# Patient Record
Sex: Male | Born: 1935 | Race: White | Hispanic: No | Marital: Married | Smoking: Never smoker
Health system: Southern US, Community
[De-identification: ages and names within clinical notes are randomized; demographics above are authoritative.]

## PROBLEM LIST (undated history)

## (undated) ENCOUNTER — Emergency Department (HOSPITAL_COMMUNITY): Payer: PRIVATE HEALTH INSURANCE | Source: Home / Self Care

## (undated) DIAGNOSIS — G8929 Other chronic pain: Secondary | ICD-10-CM

## (undated) DIAGNOSIS — I639 Cerebral infarction, unspecified: Secondary | ICD-10-CM

## (undated) DIAGNOSIS — E7800 Pure hypercholesterolemia, unspecified: Secondary | ICD-10-CM

## (undated) DIAGNOSIS — I10 Essential (primary) hypertension: Secondary | ICD-10-CM

## (undated) DIAGNOSIS — J449 Chronic obstructive pulmonary disease, unspecified: Secondary | ICD-10-CM

## (undated) HISTORY — PX: BACK SURGERY: SHX140

## (undated) HISTORY — DX: Cerebral infarction, unspecified: I63.9

## (undated) HISTORY — PX: JOINT REPLACEMENT: SHX530

---

## 2004-08-15 ENCOUNTER — Ambulatory Visit: Payer: Self-pay | Admitting: Internal Medicine

## 2004-08-28 ENCOUNTER — Ambulatory Visit: Payer: Self-pay | Admitting: Internal Medicine

## 2004-09-01 ENCOUNTER — Ambulatory Visit (HOSPITAL_COMMUNITY): Admission: RE | Admit: 2004-09-01 | Discharge: 2004-09-01 | Payer: Self-pay | Admitting: Pulmonary Disease

## 2004-09-06 ENCOUNTER — Ambulatory Visit: Payer: Self-pay | Admitting: Internal Medicine

## 2006-07-05 ENCOUNTER — Ambulatory Visit: Admission: RE | Admit: 2006-07-05 | Discharge: 2006-10-03 | Payer: Self-pay | Admitting: Radiation Oncology

## 2006-09-23 LAB — URINALYSIS, MICROSCOPIC - CHCC
Bilirubin (Urine): NEGATIVE
Glucose: NEGATIVE g/dL
Ketones: NEGATIVE mg/dL
Leukocyte Esterase: NEGATIVE
Nitrite: NEGATIVE
Protein: NEGATIVE mg/dL
Specific Gravity, Urine: 1.005 (ref 1.003–1.035)
WBC, UA: NEGATIVE (ref 0–2)
pH: 8 (ref 4.6–8.0)

## 2006-09-24 LAB — URINE CULTURE

## 2006-10-04 ENCOUNTER — Ambulatory Visit: Admission: RE | Admit: 2006-10-04 | Discharge: 2006-12-12 | Payer: Self-pay | Admitting: Radiation Oncology

## 2009-08-08 ENCOUNTER — Encounter: Payer: Self-pay | Admitting: Family Medicine

## 2009-09-13 ENCOUNTER — Ambulatory Visit: Payer: Self-pay | Admitting: Sports Medicine

## 2009-09-13 DIAGNOSIS — M129 Arthropathy, unspecified: Secondary | ICD-10-CM | POA: Insufficient documentation

## 2009-09-13 DIAGNOSIS — M216X9 Other acquired deformities of unspecified foot: Secondary | ICD-10-CM | POA: Insufficient documentation

## 2009-09-13 DIAGNOSIS — M1990 Unspecified osteoarthritis, unspecified site: Secondary | ICD-10-CM | POA: Insufficient documentation

## 2009-09-13 DIAGNOSIS — R269 Unspecified abnormalities of gait and mobility: Secondary | ICD-10-CM | POA: Insufficient documentation

## 2010-09-07 ENCOUNTER — Inpatient Hospital Stay (HOSPITAL_COMMUNITY)
Admission: RE | Admit: 2010-09-07 | Discharge: 2010-09-10 | Payer: Self-pay | Source: Home / Self Care | Attending: Neurological Surgery | Admitting: Neurological Surgery

## 2010-10-10 NOTE — Assessment & Plan Note (Signed)
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  Patient had orthotics given to him 10 years ago for knee DJD which helped a great deal. Still has the same ones but not as much support and falling apart. Knees currently stable - no mechanical symptoms Received ESIs for low back. No foot/ankle issues.  Allergies (verified): No Known Drug Allergies  Physical Exam  General:  Well-developed,well-nourished,in no acute distress; alert,appropriate and cooperative throughout examination Msk:  Bilateral knees: 1+ synovitis, 1+ crepitation Minimal medial joint line TTP.  Bilateral feet: Cavus feet Mild hallux rigidus No hallux valgus Mild transverse arch breakdown but no callus formation. Leg lengths equal. Gait with outturning bilateral feet to walk on broader base with some pronation.   Impression & Recommendations:  Problem # 1:  ARTHRITIS, KNEES, BILATERAL (ICD-716.98) Assessment Unchanged  Patient was fitted for a : standard, cushioned, semi-rigid orthotic. The orthotic was heated and afterward the patient stood on the orthotic blank positioned on the orthotic stand. The patient was positioned in subtalar neutral position and 10 degrees of ankle dorsiflexion in a weight bearing stance. After completion of molding, a stable base was applied to the orthotic blank. The blank was ground to a stable position for weight bearing. Size: 11 blue fastek Base: blue med density EVA Posting: none additional Total prep time 45 minutes. Gait improved - less pronation and less outturning bilateral feet.  Both orthotics  comfortable.  Orders: Orthotic Materials, each unit [phone])  Problem # 2:  ABNORMALITY OF GAIT (ICD-781.2) Assessment: Unchanged  Orders: Orthotic Materials, each unit ([id number])  Problem # 3:  CAVUS DEFORMITY OF FOOT, ACQUIRED (ICD-736.73) Assessment: Unchanged  Orders: Orthotic Materials, each unit (G2952)  Problem # 4:  OSTEOARTHRITIS, BACK (ICD-715.98) Assessment: Unchanged  Orders: Orthotic Materials, each unit [phone])

## 2010-10-10 NOTE — Letter (Signed)
Summary: *Consult Note  [facility]  [address]   [place], [place] [address]   Phone: [phone]  Fax: [phone]    Re:    Duane Park DOB:    11-09-1931 Barnett Abu, MD Horald Pollen, PA [facility] [address] [place], [address] [place], [place]  [address] Fax: [phone]           September 14, 2009   Dear Sherilyn Cooter and Fannie Knee    Thank you for requesting that we see the above patient for consultation.  A copy of the detailed office note will be sent under separate cover, for your review.  Evaluation today is consistent with: DJD of knees and DDD of spine for which orthotics have lessened symptoms in past.  current orthotics are breaking down and more rigid in construction.   Our recommendation is for: custom orthotic fabricated today with more cushion.  Hopefully, this will lessen knee pain and possibly some of his back pain.   Thank you for this consultation.  If you have any further questions regarding the care of this patient, please do not hesitate to contact me @ [phone].  Thank you for this opportunity to look after your patient.    Sincerely,  Vincent Gros MD

## 2010-10-10 NOTE — Consult Note (Signed)
Summary: [facility]   Imported By: Marily Memos 09/14/2009 09:41:04  _____________________________________________________________________  External Attachment:    Type:   Image     Comment:   External Document

## 2010-11-20 LAB — CBC
HCT: 48.7 % (ref 39.0–52.0)
Hemoglobin: 16.7 g/dL (ref 13.0–17.0)
MCH: 34.6 pg — ABNORMAL HIGH (ref 26.0–34.0)
MCHC: 34.3 g/dL (ref 30.0–36.0)
MCV: 101 fL — ABNORMAL HIGH (ref 78.0–100.0)
Platelets: 198 10*3/uL (ref 150–400)
RBC: 4.82 MIL/uL (ref 4.22–5.81)
RDW: 12.4 % (ref 11.5–15.5)
WBC: 8.8 10*3/uL (ref 4.0–10.5)

## 2010-11-20 LAB — BASIC METABOLIC PANEL
BUN: 40 mg/dL — ABNORMAL HIGH (ref 6–23)
CO2: 27 mEq/L (ref 19–32)
Calcium: 9.2 mg/dL (ref 8.4–10.5)
Chloride: 104 mEq/L (ref 96–112)
Creatinine, Ser: 1.12 mg/dL (ref 0.4–1.5)
GFR calc Af Amer: >60 mL/min (ref >60)
GFR calc non Af Amer: >60 mL/min (ref >60)
Glucose, Bld: 100 mg/dL — ABNORMAL HIGH (ref 70–99)
Potassium: 3.8 mEq/L (ref 3.5–5.1)
Sodium: 139 mEq/L (ref 135–145)

## 2010-11-20 LAB — TYPE AND SCREEN
ABO/RH(D): O POS
Antibody Screen: NEGATIVE

## 2010-11-20 LAB — SURGICAL PCR SCREEN
MRSA, PCR: NEGATIVE
Staphylococcus aureus: NEGATIVE

## 2010-11-20 LAB — ABO/RH: ABO/RH(D): O POS

## 2011-10-15 DIAGNOSIS — Z8546 Personal history of malignant neoplasm of prostate: Secondary | ICD-10-CM | POA: Diagnosis not present

## 2011-10-22 DIAGNOSIS — Z8546 Personal history of malignant neoplasm of prostate: Secondary | ICD-10-CM | POA: Diagnosis not present

## 2011-10-22 DIAGNOSIS — N401 Enlarged prostate with lower urinary tract symptoms: Secondary | ICD-10-CM | POA: Diagnosis not present

## 2011-10-22 DIAGNOSIS — N138 Other obstructive and reflux uropathy: Secondary | ICD-10-CM | POA: Diagnosis not present

## 2011-10-22 DIAGNOSIS — N529 Male erectile dysfunction, unspecified: Secondary | ICD-10-CM | POA: Diagnosis not present

## 2011-10-26 DIAGNOSIS — Z79899 Other long term (current) drug therapy: Secondary | ICD-10-CM | POA: Diagnosis not present

## 2011-10-26 DIAGNOSIS — I10 Essential (primary) hypertension: Secondary | ICD-10-CM | POA: Diagnosis not present

## 2011-10-26 DIAGNOSIS — E782 Mixed hyperlipidemia: Secondary | ICD-10-CM | POA: Diagnosis not present

## 2011-10-26 DIAGNOSIS — K219 Gastro-esophageal reflux disease without esophagitis: Secondary | ICD-10-CM | POA: Diagnosis not present

## 2011-12-05 DIAGNOSIS — H4900 Third [oculomotor] nerve palsy, unspecified eye: Secondary | ICD-10-CM | POA: Diagnosis not present

## 2011-12-05 DIAGNOSIS — H3530 Unspecified macular degeneration: Secondary | ICD-10-CM | POA: Diagnosis not present

## 2011-12-05 DIAGNOSIS — H26499 Other secondary cataract, unspecified eye: Secondary | ICD-10-CM | POA: Diagnosis not present

## 2011-12-05 DIAGNOSIS — H532 Diplopia: Secondary | ICD-10-CM | POA: Diagnosis not present

## 2012-02-22 DIAGNOSIS — K219 Gastro-esophageal reflux disease without esophagitis: Secondary | ICD-10-CM | POA: Diagnosis not present

## 2012-02-22 DIAGNOSIS — I10 Essential (primary) hypertension: Secondary | ICD-10-CM | POA: Diagnosis not present

## 2012-02-22 DIAGNOSIS — E782 Mixed hyperlipidemia: Secondary | ICD-10-CM | POA: Diagnosis not present

## 2012-02-22 DIAGNOSIS — Z79899 Other long term (current) drug therapy: Secondary | ICD-10-CM | POA: Diagnosis not present

## 2012-03-07 DIAGNOSIS — H532 Diplopia: Secondary | ICD-10-CM | POA: Diagnosis not present

## 2012-03-07 DIAGNOSIS — H26499 Other secondary cataract, unspecified eye: Secondary | ICD-10-CM | POA: Diagnosis not present

## 2012-03-07 DIAGNOSIS — H3530 Unspecified macular degeneration: Secondary | ICD-10-CM | POA: Diagnosis not present

## 2012-04-18 DIAGNOSIS — M47817 Spondylosis without myelopathy or radiculopathy, lumbosacral region: Secondary | ICD-10-CM | POA: Diagnosis not present

## 2012-04-18 DIAGNOSIS — M48061 Spinal stenosis, lumbar region without neurogenic claudication: Secondary | ICD-10-CM | POA: Diagnosis not present

## 2012-06-20 DIAGNOSIS — Z23 Encounter for immunization: Secondary | ICD-10-CM | POA: Diagnosis not present

## 2012-06-20 DIAGNOSIS — K219 Gastro-esophageal reflux disease without esophagitis: Secondary | ICD-10-CM | POA: Diagnosis not present

## 2012-06-20 DIAGNOSIS — I10 Essential (primary) hypertension: Secondary | ICD-10-CM | POA: Diagnosis not present

## 2012-06-20 DIAGNOSIS — E782 Mixed hyperlipidemia: Secondary | ICD-10-CM | POA: Diagnosis not present

## 2012-06-20 DIAGNOSIS — Z79899 Other long term (current) drug therapy: Secondary | ICD-10-CM | POA: Diagnosis not present

## 2012-10-15 DIAGNOSIS — Z8546 Personal history of malignant neoplasm of prostate: Secondary | ICD-10-CM | POA: Diagnosis not present

## 2012-10-17 DIAGNOSIS — E782 Mixed hyperlipidemia: Secondary | ICD-10-CM | POA: Diagnosis not present

## 2012-10-17 DIAGNOSIS — Z79899 Other long term (current) drug therapy: Secondary | ICD-10-CM | POA: Diagnosis not present

## 2012-10-17 DIAGNOSIS — I10 Essential (primary) hypertension: Secondary | ICD-10-CM | POA: Diagnosis not present

## 2012-10-22 DIAGNOSIS — N138 Other obstructive and reflux uropathy: Secondary | ICD-10-CM | POA: Diagnosis not present

## 2012-10-22 DIAGNOSIS — R3129 Other microscopic hematuria: Secondary | ICD-10-CM | POA: Diagnosis not present

## 2012-10-22 DIAGNOSIS — N401 Enlarged prostate with lower urinary tract symptoms: Secondary | ICD-10-CM | POA: Diagnosis not present

## 2012-10-22 DIAGNOSIS — Z8546 Personal history of malignant neoplasm of prostate: Secondary | ICD-10-CM | POA: Diagnosis not present

## 2012-10-22 DIAGNOSIS — N529 Male erectile dysfunction, unspecified: Secondary | ICD-10-CM | POA: Diagnosis not present

## 2012-11-04 DIAGNOSIS — H26499 Other secondary cataract, unspecified eye: Secondary | ICD-10-CM | POA: Diagnosis not present

## 2012-11-04 DIAGNOSIS — H35039 Hypertensive retinopathy, unspecified eye: Secondary | ICD-10-CM | POA: Diagnosis not present

## 2012-11-04 DIAGNOSIS — H4900 Third [oculomotor] nerve palsy, unspecified eye: Secondary | ICD-10-CM | POA: Diagnosis not present

## 2012-11-06 DIAGNOSIS — H26499 Other secondary cataract, unspecified eye: Secondary | ICD-10-CM | POA: Diagnosis not present

## 2013-02-13 DIAGNOSIS — E782 Mixed hyperlipidemia: Secondary | ICD-10-CM | POA: Diagnosis not present

## 2013-02-13 DIAGNOSIS — M7730 Calcaneal spur, unspecified foot: Secondary | ICD-10-CM | POA: Diagnosis not present

## 2013-02-13 DIAGNOSIS — I10 Essential (primary) hypertension: Secondary | ICD-10-CM | POA: Diagnosis not present

## 2013-02-13 DIAGNOSIS — M79609 Pain in unspecified limb: Secondary | ICD-10-CM | POA: Diagnosis not present

## 2013-03-06 DIAGNOSIS — Z981 Arthrodesis status: Secondary | ICD-10-CM | POA: Diagnosis not present

## 2013-03-06 DIAGNOSIS — M47817 Spondylosis without myelopathy or radiculopathy, lumbosacral region: Secondary | ICD-10-CM | POA: Diagnosis not present

## 2013-03-06 DIAGNOSIS — IMO0002 Reserved for concepts with insufficient information to code with codable children: Secondary | ICD-10-CM | POA: Diagnosis not present

## 2013-03-06 DIAGNOSIS — M4180 Other forms of scoliosis, site unspecified: Secondary | ICD-10-CM | POA: Diagnosis not present

## 2013-03-06 DIAGNOSIS — M961 Postlaminectomy syndrome, not elsewhere classified: Secondary | ICD-10-CM | POA: Diagnosis not present

## 2013-06-18 DIAGNOSIS — Z23 Encounter for immunization: Secondary | ICD-10-CM | POA: Diagnosis not present

## 2013-06-18 DIAGNOSIS — E782 Mixed hyperlipidemia: Secondary | ICD-10-CM | POA: Diagnosis not present

## 2013-06-18 DIAGNOSIS — I10 Essential (primary) hypertension: Secondary | ICD-10-CM | POA: Diagnosis not present

## 2013-06-18 DIAGNOSIS — Z79899 Other long term (current) drug therapy: Secondary | ICD-10-CM | POA: Diagnosis not present

## 2013-06-18 DIAGNOSIS — Z86010 Personal history of colonic polyps: Secondary | ICD-10-CM | POA: Diagnosis not present

## 2013-06-18 DIAGNOSIS — K219 Gastro-esophageal reflux disease without esophagitis: Secondary | ICD-10-CM | POA: Diagnosis not present

## 2013-06-18 DIAGNOSIS — L570 Actinic keratosis: Secondary | ICD-10-CM | POA: Diagnosis not present

## 2013-06-18 DIAGNOSIS — Z0000 Encounter for general adult medical examination without abnormal findings: Secondary | ICD-10-CM | POA: Diagnosis not present

## 2013-06-21 DIAGNOSIS — J0100 Acute maxillary sinusitis, unspecified: Secondary | ICD-10-CM | POA: Diagnosis not present

## 2013-06-21 DIAGNOSIS — J209 Acute bronchitis, unspecified: Secondary | ICD-10-CM | POA: Diagnosis not present

## 2013-09-22 DIAGNOSIS — IMO0002 Reserved for concepts with insufficient information to code with codable children: Secondary | ICD-10-CM | POA: Diagnosis not present

## 2013-09-22 DIAGNOSIS — M47817 Spondylosis without myelopathy or radiculopathy, lumbosacral region: Secondary | ICD-10-CM | POA: Diagnosis not present

## 2013-10-21 DIAGNOSIS — M545 Low back pain, unspecified: Secondary | ICD-10-CM | POA: Diagnosis not present

## 2013-10-29 DIAGNOSIS — I10 Essential (primary) hypertension: Secondary | ICD-10-CM | POA: Diagnosis not present

## 2013-10-29 DIAGNOSIS — R1032 Left lower quadrant pain: Secondary | ICD-10-CM | POA: Diagnosis not present

## 2013-10-29 DIAGNOSIS — E782 Mixed hyperlipidemia: Secondary | ICD-10-CM | POA: Diagnosis not present

## 2013-10-29 DIAGNOSIS — M545 Low back pain, unspecified: Secondary | ICD-10-CM | POA: Diagnosis not present

## 2013-10-30 DIAGNOSIS — R1032 Left lower quadrant pain: Secondary | ICD-10-CM | POA: Diagnosis not present

## 2013-10-30 DIAGNOSIS — N200 Calculus of kidney: Secondary | ICD-10-CM | POA: Diagnosis not present

## 2013-10-30 DIAGNOSIS — Z8546 Personal history of malignant neoplasm of prostate: Secondary | ICD-10-CM | POA: Diagnosis not present

## 2013-10-30 DIAGNOSIS — Q618 Other cystic kidney diseases: Secondary | ICD-10-CM | POA: Diagnosis not present

## 2013-11-11 DIAGNOSIS — Z8546 Personal history of malignant neoplasm of prostate: Secondary | ICD-10-CM | POA: Diagnosis not present

## 2013-11-16 DIAGNOSIS — B029 Zoster without complications: Secondary | ICD-10-CM | POA: Diagnosis not present

## 2013-11-18 DIAGNOSIS — Z8546 Personal history of malignant neoplasm of prostate: Secondary | ICD-10-CM | POA: Diagnosis not present

## 2013-11-18 DIAGNOSIS — N200 Calculus of kidney: Secondary | ICD-10-CM | POA: Diagnosis not present

## 2013-11-18 DIAGNOSIS — D4959 Neoplasm of unspecified behavior of other genitourinary organ: Secondary | ICD-10-CM | POA: Diagnosis not present

## 2013-12-08 DIAGNOSIS — L719 Rosacea, unspecified: Secondary | ICD-10-CM | POA: Diagnosis not present

## 2013-12-08 DIAGNOSIS — H524 Presbyopia: Secondary | ICD-10-CM | POA: Diagnosis not present

## 2013-12-08 DIAGNOSIS — H353190 Nonexudative age-related macular degeneration, unspecified eye, stage unspecified: Secondary | ICD-10-CM | POA: Diagnosis not present

## 2013-12-08 DIAGNOSIS — Z961 Presence of intraocular lens: Secondary | ICD-10-CM | POA: Diagnosis not present

## 2013-12-17 DIAGNOSIS — H905 Unspecified sensorineural hearing loss: Secondary | ICD-10-CM | POA: Diagnosis not present

## 2013-12-17 DIAGNOSIS — H9319 Tinnitus, unspecified ear: Secondary | ICD-10-CM | POA: Diagnosis not present

## 2013-12-17 DIAGNOSIS — H903 Sensorineural hearing loss, bilateral: Secondary | ICD-10-CM | POA: Diagnosis not present

## 2014-01-07 DIAGNOSIS — H9319 Tinnitus, unspecified ear: Secondary | ICD-10-CM | POA: Diagnosis not present

## 2014-01-07 DIAGNOSIS — H905 Unspecified sensorineural hearing loss: Secondary | ICD-10-CM | POA: Diagnosis not present

## 2014-01-07 DIAGNOSIS — H908 Mixed conductive and sensorineural hearing loss, unspecified: Secondary | ICD-10-CM | POA: Diagnosis not present

## 2014-01-08 DIAGNOSIS — H9319 Tinnitus, unspecified ear: Secondary | ICD-10-CM | POA: Diagnosis not present

## 2014-01-08 DIAGNOSIS — H905 Unspecified sensorineural hearing loss: Secondary | ICD-10-CM | POA: Diagnosis not present

## 2014-01-08 DIAGNOSIS — H903 Sensorineural hearing loss, bilateral: Secondary | ICD-10-CM | POA: Diagnosis not present

## 2014-01-14 DIAGNOSIS — J209 Acute bronchitis, unspecified: Secondary | ICD-10-CM | POA: Diagnosis not present

## 2014-01-18 DIAGNOSIS — H9319 Tinnitus, unspecified ear: Secondary | ICD-10-CM | POA: Diagnosis not present

## 2014-01-18 DIAGNOSIS — G319 Degenerative disease of nervous system, unspecified: Secondary | ICD-10-CM | POA: Diagnosis not present

## 2014-01-18 DIAGNOSIS — R930 Abnormal findings on diagnostic imaging of skull and head, not elsewhere classified: Secondary | ICD-10-CM | POA: Diagnosis not present

## 2014-01-18 DIAGNOSIS — H903 Sensorineural hearing loss, bilateral: Secondary | ICD-10-CM | POA: Diagnosis not present

## 2014-02-16 DIAGNOSIS — M545 Low back pain, unspecified: Secondary | ICD-10-CM | POA: Diagnosis not present

## 2014-02-16 DIAGNOSIS — M4120 Other idiopathic scoliosis, site unspecified: Secondary | ICD-10-CM | POA: Diagnosis not present

## 2014-02-16 DIAGNOSIS — Z6829 Body mass index (BMI) 29.0-29.9, adult: Secondary | ICD-10-CM | POA: Diagnosis not present

## 2014-02-25 DIAGNOSIS — IMO0002 Reserved for concepts with insufficient information to code with codable children: Secondary | ICD-10-CM | POA: Diagnosis not present

## 2014-02-25 DIAGNOSIS — I10 Essential (primary) hypertension: Secondary | ICD-10-CM | POA: Diagnosis not present

## 2014-02-25 DIAGNOSIS — E782 Mixed hyperlipidemia: Secondary | ICD-10-CM | POA: Diagnosis not present

## 2014-03-08 DIAGNOSIS — M545 Low back pain, unspecified: Secondary | ICD-10-CM | POA: Diagnosis not present

## 2014-03-08 DIAGNOSIS — IMO0002 Reserved for concepts with insufficient information to code with codable children: Secondary | ICD-10-CM | POA: Diagnosis not present

## 2014-04-01 DIAGNOSIS — IMO0002 Reserved for concepts with insufficient information to code with codable children: Secondary | ICD-10-CM | POA: Diagnosis not present

## 2014-04-06 DIAGNOSIS — IMO0002 Reserved for concepts with insufficient information to code with codable children: Secondary | ICD-10-CM | POA: Diagnosis not present

## 2014-04-07 DIAGNOSIS — M5126 Other intervertebral disc displacement, lumbar region: Secondary | ICD-10-CM | POA: Diagnosis not present

## 2014-04-07 DIAGNOSIS — M48061 Spinal stenosis, lumbar region without neurogenic claudication: Secondary | ICD-10-CM | POA: Diagnosis not present

## 2014-04-07 DIAGNOSIS — IMO0002 Reserved for concepts with insufficient information to code with codable children: Secondary | ICD-10-CM | POA: Diagnosis not present

## 2014-04-15 DIAGNOSIS — Z6829 Body mass index (BMI) 29.0-29.9, adult: Secondary | ICD-10-CM | POA: Diagnosis not present

## 2014-04-15 DIAGNOSIS — IMO0002 Reserved for concepts with insufficient information to code with codable children: Secondary | ICD-10-CM | POA: Diagnosis not present

## 2014-06-11 DIAGNOSIS — M545 Low back pain: Secondary | ICD-10-CM | POA: Diagnosis not present

## 2014-06-11 DIAGNOSIS — M4716 Other spondylosis with myelopathy, lumbar region: Secondary | ICD-10-CM | POA: Diagnosis not present

## 2014-07-08 DIAGNOSIS — I10 Essential (primary) hypertension: Secondary | ICD-10-CM | POA: Diagnosis not present

## 2014-07-08 DIAGNOSIS — E782 Mixed hyperlipidemia: Secondary | ICD-10-CM | POA: Diagnosis not present

## 2014-07-08 DIAGNOSIS — K219 Gastro-esophageal reflux disease without esophagitis: Secondary | ICD-10-CM | POA: Diagnosis not present

## 2014-07-08 DIAGNOSIS — Z23 Encounter for immunization: Secondary | ICD-10-CM | POA: Diagnosis not present

## 2014-07-08 DIAGNOSIS — Z79899 Other long term (current) drug therapy: Secondary | ICD-10-CM | POA: Diagnosis not present

## 2014-08-04 DIAGNOSIS — Z6829 Body mass index (BMI) 29.0-29.9, adult: Secondary | ICD-10-CM | POA: Diagnosis not present

## 2014-08-04 DIAGNOSIS — M419 Scoliosis, unspecified: Secondary | ICD-10-CM | POA: Diagnosis not present

## 2014-10-08 DIAGNOSIS — M4806 Spinal stenosis, lumbar region: Secondary | ICD-10-CM | POA: Diagnosis not present

## 2014-10-08 DIAGNOSIS — Z981 Arthrodesis status: Secondary | ICD-10-CM | POA: Diagnosis not present

## 2014-10-08 DIAGNOSIS — M47816 Spondylosis without myelopathy or radiculopathy, lumbar region: Secondary | ICD-10-CM | POA: Diagnosis not present

## 2014-10-14 DIAGNOSIS — I10 Essential (primary) hypertension: Secondary | ICD-10-CM | POA: Diagnosis not present

## 2014-10-14 DIAGNOSIS — K219 Gastro-esophageal reflux disease without esophagitis: Secondary | ICD-10-CM | POA: Diagnosis not present

## 2014-10-14 DIAGNOSIS — D4102 Neoplasm of uncertain behavior of left kidney: Secondary | ICD-10-CM | POA: Diagnosis not present

## 2014-10-14 DIAGNOSIS — Z0000 Encounter for general adult medical examination without abnormal findings: Secondary | ICD-10-CM | POA: Diagnosis not present

## 2014-10-14 DIAGNOSIS — E782 Mixed hyperlipidemia: Secondary | ICD-10-CM | POA: Diagnosis not present

## 2014-10-14 DIAGNOSIS — Z23 Encounter for immunization: Secondary | ICD-10-CM | POA: Diagnosis not present

## 2014-10-14 DIAGNOSIS — Z8546 Personal history of malignant neoplasm of prostate: Secondary | ICD-10-CM | POA: Diagnosis not present

## 2014-11-29 DIAGNOSIS — Z8546 Personal history of malignant neoplasm of prostate: Secondary | ICD-10-CM | POA: Diagnosis not present

## 2014-12-06 DIAGNOSIS — D495 Neoplasm of unspecified behavior of other genitourinary organs: Secondary | ICD-10-CM | POA: Diagnosis not present

## 2014-12-06 DIAGNOSIS — N200 Calculus of kidney: Secondary | ICD-10-CM | POA: Diagnosis not present

## 2014-12-06 DIAGNOSIS — Z8546 Personal history of malignant neoplasm of prostate: Secondary | ICD-10-CM | POA: Diagnosis not present

## 2014-12-06 DIAGNOSIS — N281 Cyst of kidney, acquired: Secondary | ICD-10-CM | POA: Diagnosis not present

## 2014-12-06 DIAGNOSIS — N5201 Erectile dysfunction due to arterial insufficiency: Secondary | ICD-10-CM | POA: Diagnosis not present

## 2015-01-14 DIAGNOSIS — M47816 Spondylosis without myelopathy or radiculopathy, lumbar region: Secondary | ICD-10-CM | POA: Diagnosis not present

## 2015-01-14 DIAGNOSIS — M5416 Radiculopathy, lumbar region: Secondary | ICD-10-CM | POA: Diagnosis not present

## 2015-01-14 DIAGNOSIS — Z981 Arthrodesis status: Secondary | ICD-10-CM | POA: Diagnosis not present

## 2015-01-14 DIAGNOSIS — M4306 Spondylolysis, lumbar region: Secondary | ICD-10-CM | POA: Diagnosis not present

## 2015-01-18 DIAGNOSIS — H26493 Other secondary cataract, bilateral: Secondary | ICD-10-CM | POA: Diagnosis not present

## 2015-02-24 DIAGNOSIS — E780 Pure hypercholesterolemia: Secondary | ICD-10-CM | POA: Diagnosis not present

## 2015-02-24 DIAGNOSIS — K219 Gastro-esophageal reflux disease without esophagitis: Secondary | ICD-10-CM | POA: Diagnosis not present

## 2015-02-24 DIAGNOSIS — I10 Essential (primary) hypertension: Secondary | ICD-10-CM | POA: Diagnosis not present

## 2015-02-24 DIAGNOSIS — E782 Mixed hyperlipidemia: Secondary | ICD-10-CM | POA: Diagnosis not present

## 2015-02-24 DIAGNOSIS — Z79899 Other long term (current) drug therapy: Secondary | ICD-10-CM | POA: Diagnosis not present

## 2015-03-10 DIAGNOSIS — R05 Cough: Secondary | ICD-10-CM | POA: Diagnosis not present

## 2015-05-13 DIAGNOSIS — M5416 Radiculopathy, lumbar region: Secondary | ICD-10-CM | POA: Diagnosis not present

## 2015-05-13 DIAGNOSIS — M5442 Lumbago with sciatica, left side: Secondary | ICD-10-CM | POA: Diagnosis not present

## 2015-06-27 DIAGNOSIS — I10 Essential (primary) hypertension: Secondary | ICD-10-CM | POA: Diagnosis not present

## 2015-06-27 DIAGNOSIS — M545 Low back pain: Secondary | ICD-10-CM | POA: Diagnosis not present

## 2015-06-27 DIAGNOSIS — E782 Mixed hyperlipidemia: Secondary | ICD-10-CM | POA: Diagnosis not present

## 2015-06-27 DIAGNOSIS — Z23 Encounter for immunization: Secondary | ICD-10-CM | POA: Diagnosis not present

## 2015-10-03 DIAGNOSIS — J209 Acute bronchitis, unspecified: Secondary | ICD-10-CM | POA: Diagnosis not present

## 2015-10-17 DIAGNOSIS — E669 Obesity, unspecified: Secondary | ICD-10-CM | POA: Diagnosis not present

## 2015-10-17 DIAGNOSIS — Z0000 Encounter for general adult medical examination without abnormal findings: Secondary | ICD-10-CM | POA: Diagnosis not present

## 2015-10-17 DIAGNOSIS — Z8546 Personal history of malignant neoplasm of prostate: Secondary | ICD-10-CM | POA: Diagnosis not present

## 2015-10-17 DIAGNOSIS — K219 Gastro-esophageal reflux disease without esophagitis: Secondary | ICD-10-CM | POA: Diagnosis not present

## 2015-10-17 DIAGNOSIS — E782 Mixed hyperlipidemia: Secondary | ICD-10-CM | POA: Diagnosis not present

## 2015-10-17 DIAGNOSIS — Z6831 Body mass index (BMI) 31.0-31.9, adult: Secondary | ICD-10-CM | POA: Diagnosis not present

## 2015-10-17 DIAGNOSIS — Z79899 Other long term (current) drug therapy: Secondary | ICD-10-CM | POA: Diagnosis not present

## 2015-10-17 DIAGNOSIS — I10 Essential (primary) hypertension: Secondary | ICD-10-CM | POA: Diagnosis not present

## 2015-11-08 DIAGNOSIS — M25561 Pain in right knee: Secondary | ICD-10-CM | POA: Diagnosis not present

## 2015-11-08 DIAGNOSIS — M25562 Pain in left knee: Secondary | ICD-10-CM | POA: Diagnosis not present

## 2015-11-08 DIAGNOSIS — M25511 Pain in right shoulder: Secondary | ICD-10-CM | POA: Diagnosis not present

## 2015-12-01 DIAGNOSIS — J0140 Acute pansinusitis, unspecified: Secondary | ICD-10-CM | POA: Diagnosis not present

## 2015-12-01 DIAGNOSIS — Z8546 Personal history of malignant neoplasm of prostate: Secondary | ICD-10-CM | POA: Diagnosis not present

## 2015-12-08 DIAGNOSIS — N5201 Erectile dysfunction due to arterial insufficiency: Secondary | ICD-10-CM | POA: Diagnosis not present

## 2015-12-08 DIAGNOSIS — N200 Calculus of kidney: Secondary | ICD-10-CM | POA: Diagnosis not present

## 2015-12-08 DIAGNOSIS — Z8546 Personal history of malignant neoplasm of prostate: Secondary | ICD-10-CM | POA: Diagnosis not present

## 2015-12-08 DIAGNOSIS — Z0000 Encounter for general adult medical examination without abnormal findings: Secondary | ICD-10-CM | POA: Diagnosis not present

## 2016-01-05 DIAGNOSIS — L570 Actinic keratosis: Secondary | ICD-10-CM | POA: Diagnosis not present

## 2016-01-05 DIAGNOSIS — L821 Other seborrheic keratosis: Secondary | ICD-10-CM | POA: Diagnosis not present

## 2016-01-05 DIAGNOSIS — L578 Other skin changes due to chronic exposure to nonionizing radiation: Secondary | ICD-10-CM | POA: Diagnosis not present

## 2016-01-05 DIAGNOSIS — D485 Neoplasm of uncertain behavior of skin: Secondary | ICD-10-CM | POA: Diagnosis not present

## 2016-01-05 DIAGNOSIS — C44311 Basal cell carcinoma of skin of nose: Secondary | ICD-10-CM | POA: Diagnosis not present

## 2016-01-20 DIAGNOSIS — H26493 Other secondary cataract, bilateral: Secondary | ICD-10-CM | POA: Diagnosis not present

## 2016-01-20 DIAGNOSIS — H524 Presbyopia: Secondary | ICD-10-CM | POA: Diagnosis not present

## 2016-02-27 DIAGNOSIS — D044 Carcinoma in situ of skin of scalp and neck: Secondary | ICD-10-CM | POA: Diagnosis not present

## 2016-03-01 DIAGNOSIS — E782 Mixed hyperlipidemia: Secondary | ICD-10-CM | POA: Diagnosis not present

## 2016-03-01 DIAGNOSIS — I10 Essential (primary) hypertension: Secondary | ICD-10-CM | POA: Diagnosis not present

## 2016-03-01 DIAGNOSIS — M1991 Primary osteoarthritis, unspecified site: Secondary | ICD-10-CM | POA: Diagnosis not present

## 2016-03-15 DIAGNOSIS — Z8546 Personal history of malignant neoplasm of prostate: Secondary | ICD-10-CM | POA: Diagnosis not present

## 2016-04-06 DIAGNOSIS — M25511 Pain in right shoulder: Secondary | ICD-10-CM | POA: Diagnosis not present

## 2016-07-02 DIAGNOSIS — K219 Gastro-esophageal reflux disease without esophagitis: Secondary | ICD-10-CM | POA: Diagnosis not present

## 2016-07-02 DIAGNOSIS — I10 Essential (primary) hypertension: Secondary | ICD-10-CM | POA: Diagnosis not present

## 2016-07-02 DIAGNOSIS — Z23 Encounter for immunization: Secondary | ICD-10-CM | POA: Diagnosis not present

## 2016-07-02 DIAGNOSIS — E782 Mixed hyperlipidemia: Secondary | ICD-10-CM | POA: Diagnosis not present

## 2016-08-07 DIAGNOSIS — M25512 Pain in left shoulder: Secondary | ICD-10-CM | POA: Diagnosis not present

## 2016-08-10 DIAGNOSIS — M25512 Pain in left shoulder: Secondary | ICD-10-CM | POA: Diagnosis not present

## 2016-11-29 DIAGNOSIS — K219 Gastro-esophageal reflux disease without esophagitis: Secondary | ICD-10-CM | POA: Diagnosis not present

## 2016-11-29 DIAGNOSIS — I10 Essential (primary) hypertension: Secondary | ICD-10-CM | POA: Diagnosis not present

## 2016-11-29 DIAGNOSIS — Z79899 Other long term (current) drug therapy: Secondary | ICD-10-CM | POA: Diagnosis not present

## 2016-11-29 DIAGNOSIS — E782 Mixed hyperlipidemia: Secondary | ICD-10-CM | POA: Diagnosis not present

## 2016-11-29 DIAGNOSIS — M25511 Pain in right shoulder: Secondary | ICD-10-CM | POA: Diagnosis not present

## 2016-11-29 DIAGNOSIS — Z8546 Personal history of malignant neoplasm of prostate: Secondary | ICD-10-CM | POA: Diagnosis not present

## 2016-11-29 DIAGNOSIS — M25519 Pain in unspecified shoulder: Secondary | ICD-10-CM | POA: Diagnosis not present

## 2016-12-03 DIAGNOSIS — Z8546 Personal history of malignant neoplasm of prostate: Secondary | ICD-10-CM | POA: Diagnosis not present

## 2016-12-10 DIAGNOSIS — Z8546 Personal history of malignant neoplasm of prostate: Secondary | ICD-10-CM | POA: Diagnosis not present

## 2016-12-10 DIAGNOSIS — R351 Nocturia: Secondary | ICD-10-CM | POA: Diagnosis not present

## 2016-12-10 DIAGNOSIS — N401 Enlarged prostate with lower urinary tract symptoms: Secondary | ICD-10-CM | POA: Diagnosis not present

## 2017-01-08 DIAGNOSIS — L300 Nummular dermatitis: Secondary | ICD-10-CM | POA: Diagnosis not present

## 2017-01-08 DIAGNOSIS — L821 Other seborrheic keratosis: Secondary | ICD-10-CM | POA: Diagnosis not present

## 2017-01-08 DIAGNOSIS — L570 Actinic keratosis: Secondary | ICD-10-CM | POA: Diagnosis not present

## 2017-01-30 DIAGNOSIS — H532 Diplopia: Secondary | ICD-10-CM | POA: Diagnosis not present

## 2017-01-30 DIAGNOSIS — H524 Presbyopia: Secondary | ICD-10-CM | POA: Diagnosis not present

## 2017-01-30 DIAGNOSIS — H26493 Other secondary cataract, bilateral: Secondary | ICD-10-CM | POA: Diagnosis not present

## 2017-04-02 ENCOUNTER — Other Ambulatory Visit: Payer: Self-pay

## 2017-04-05 DIAGNOSIS — E6609 Other obesity due to excess calories: Secondary | ICD-10-CM | POA: Diagnosis not present

## 2017-04-05 DIAGNOSIS — K219 Gastro-esophageal reflux disease without esophagitis: Secondary | ICD-10-CM | POA: Diagnosis not present

## 2017-04-05 DIAGNOSIS — H9113 Presbycusis, bilateral: Secondary | ICD-10-CM | POA: Diagnosis not present

## 2017-04-05 DIAGNOSIS — Z0000 Encounter for general adult medical examination without abnormal findings: Secondary | ICD-10-CM | POA: Diagnosis not present

## 2017-04-05 DIAGNOSIS — I10 Essential (primary) hypertension: Secondary | ICD-10-CM | POA: Diagnosis not present

## 2017-04-05 DIAGNOSIS — Z79899 Other long term (current) drug therapy: Secondary | ICD-10-CM | POA: Diagnosis not present

## 2017-04-05 DIAGNOSIS — Z8546 Personal history of malignant neoplasm of prostate: Secondary | ICD-10-CM | POA: Diagnosis not present

## 2017-04-05 DIAGNOSIS — E669 Obesity, unspecified: Secondary | ICD-10-CM | POA: Diagnosis not present

## 2017-04-05 DIAGNOSIS — E782 Mixed hyperlipidemia: Secondary | ICD-10-CM | POA: Diagnosis not present

## 2017-04-05 DIAGNOSIS — Z6833 Body mass index (BMI) 33.0-33.9, adult: Secondary | ICD-10-CM | POA: Diagnosis not present

## 2017-04-05 DIAGNOSIS — M150 Primary generalized (osteo)arthritis: Secondary | ICD-10-CM | POA: Diagnosis not present

## 2017-05-30 DIAGNOSIS — J0100 Acute maxillary sinusitis, unspecified: Secondary | ICD-10-CM | POA: Diagnosis not present

## 2017-05-30 DIAGNOSIS — J209 Acute bronchitis, unspecified: Secondary | ICD-10-CM | POA: Diagnosis not present

## 2017-06-17 DIAGNOSIS — J069 Acute upper respiratory infection, unspecified: Secondary | ICD-10-CM | POA: Diagnosis not present

## 2017-06-17 DIAGNOSIS — J209 Acute bronchitis, unspecified: Secondary | ICD-10-CM | POA: Diagnosis not present

## 2017-06-23 DIAGNOSIS — J0100 Acute maxillary sinusitis, unspecified: Secondary | ICD-10-CM | POA: Diagnosis not present

## 2017-06-23 DIAGNOSIS — J209 Acute bronchitis, unspecified: Secondary | ICD-10-CM | POA: Diagnosis not present

## 2017-08-06 DIAGNOSIS — Z23 Encounter for immunization: Secondary | ICD-10-CM | POA: Diagnosis not present

## 2017-08-16 DIAGNOSIS — K219 Gastro-esophageal reflux disease without esophagitis: Secondary | ICD-10-CM | POA: Diagnosis not present

## 2017-08-16 DIAGNOSIS — I10 Essential (primary) hypertension: Secondary | ICD-10-CM | POA: Diagnosis not present

## 2017-08-16 DIAGNOSIS — M150 Primary generalized (osteo)arthritis: Secondary | ICD-10-CM | POA: Diagnosis not present

## 2017-10-08 DIAGNOSIS — J209 Acute bronchitis, unspecified: Secondary | ICD-10-CM | POA: Diagnosis not present

## 2017-10-08 DIAGNOSIS — J0100 Acute maxillary sinusitis, unspecified: Secondary | ICD-10-CM | POA: Diagnosis not present

## 2017-10-08 DIAGNOSIS — R0602 Shortness of breath: Secondary | ICD-10-CM | POA: Diagnosis not present

## 2017-12-09 DIAGNOSIS — Z8546 Personal history of malignant neoplasm of prostate: Secondary | ICD-10-CM | POA: Diagnosis not present

## 2017-12-16 DIAGNOSIS — R351 Nocturia: Secondary | ICD-10-CM | POA: Diagnosis not present

## 2017-12-16 DIAGNOSIS — N5201 Erectile dysfunction due to arterial insufficiency: Secondary | ICD-10-CM | POA: Diagnosis not present

## 2017-12-16 DIAGNOSIS — N401 Enlarged prostate with lower urinary tract symptoms: Secondary | ICD-10-CM | POA: Diagnosis not present

## 2017-12-16 DIAGNOSIS — C61 Malignant neoplasm of prostate: Secondary | ICD-10-CM | POA: Diagnosis not present

## 2018-01-10 DIAGNOSIS — E782 Mixed hyperlipidemia: Secondary | ICD-10-CM | POA: Diagnosis not present

## 2018-01-10 DIAGNOSIS — Z6833 Body mass index (BMI) 33.0-33.9, adult: Secondary | ICD-10-CM | POA: Diagnosis not present

## 2018-01-10 DIAGNOSIS — Z79899 Other long term (current) drug therapy: Secondary | ICD-10-CM | POA: Diagnosis not present

## 2018-01-10 DIAGNOSIS — E663 Overweight: Secondary | ICD-10-CM | POA: Diagnosis not present

## 2018-01-10 DIAGNOSIS — I10 Essential (primary) hypertension: Secondary | ICD-10-CM | POA: Diagnosis not present

## 2018-01-10 DIAGNOSIS — M150 Primary generalized (osteo)arthritis: Secondary | ICD-10-CM | POA: Diagnosis not present

## 2018-01-15 DIAGNOSIS — J205 Acute bronchitis due to respiratory syncytial virus: Secondary | ICD-10-CM | POA: Diagnosis not present

## 2018-01-15 DIAGNOSIS — J069 Acute upper respiratory infection, unspecified: Secondary | ICD-10-CM | POA: Diagnosis not present

## 2018-01-15 DIAGNOSIS — R062 Wheezing: Secondary | ICD-10-CM | POA: Diagnosis not present

## 2018-02-04 DIAGNOSIS — H532 Diplopia: Secondary | ICD-10-CM | POA: Diagnosis not present

## 2018-02-04 DIAGNOSIS — Z79899 Other long term (current) drug therapy: Secondary | ICD-10-CM | POA: Diagnosis not present

## 2018-02-04 DIAGNOSIS — H26493 Other secondary cataract, bilateral: Secondary | ICD-10-CM | POA: Diagnosis not present

## 2018-02-04 DIAGNOSIS — I10 Essential (primary) hypertension: Secondary | ICD-10-CM | POA: Diagnosis not present

## 2018-02-13 DIAGNOSIS — H532 Diplopia: Secondary | ICD-10-CM | POA: Diagnosis not present

## 2018-02-14 DIAGNOSIS — R05 Cough: Secondary | ICD-10-CM | POA: Diagnosis not present

## 2018-02-14 DIAGNOSIS — J069 Acute upper respiratory infection, unspecified: Secondary | ICD-10-CM | POA: Diagnosis not present

## 2018-02-14 DIAGNOSIS — J22 Unspecified acute lower respiratory infection: Secondary | ICD-10-CM | POA: Diagnosis not present

## 2018-03-03 DIAGNOSIS — R05 Cough: Secondary | ICD-10-CM | POA: Diagnosis not present

## 2018-03-10 DIAGNOSIS — B370 Candidal stomatitis: Secondary | ICD-10-CM | POA: Diagnosis not present

## 2018-03-17 DIAGNOSIS — R05 Cough: Secondary | ICD-10-CM | POA: Diagnosis not present

## 2018-03-18 DIAGNOSIS — H532 Diplopia: Secondary | ICD-10-CM | POA: Diagnosis not present

## 2018-03-23 DIAGNOSIS — J209 Acute bronchitis, unspecified: Secondary | ICD-10-CM | POA: Diagnosis not present

## 2018-03-23 DIAGNOSIS — J0100 Acute maxillary sinusitis, unspecified: Secondary | ICD-10-CM | POA: Diagnosis not present

## 2018-03-24 DIAGNOSIS — R05 Cough: Secondary | ICD-10-CM | POA: Diagnosis not present

## 2018-03-24 DIAGNOSIS — J22 Unspecified acute lower respiratory infection: Secondary | ICD-10-CM | POA: Diagnosis not present

## 2018-03-25 DIAGNOSIS — J22 Unspecified acute lower respiratory infection: Secondary | ICD-10-CM | POA: Diagnosis not present

## 2018-03-25 DIAGNOSIS — R05 Cough: Secondary | ICD-10-CM | POA: Diagnosis not present

## 2018-03-25 DIAGNOSIS — J984 Other disorders of lung: Secondary | ICD-10-CM | POA: Diagnosis not present

## 2018-03-26 DIAGNOSIS — R062 Wheezing: Secondary | ICD-10-CM | POA: Diagnosis not present

## 2018-03-26 DIAGNOSIS — R05 Cough: Secondary | ICD-10-CM | POA: Diagnosis not present

## 2018-03-26 DIAGNOSIS — J342 Deviated nasal septum: Secondary | ICD-10-CM | POA: Diagnosis not present

## 2018-03-26 DIAGNOSIS — R0989 Other specified symptoms and signs involving the circulatory and respiratory systems: Secondary | ICD-10-CM | POA: Diagnosis not present

## 2018-03-26 DIAGNOSIS — H9193 Unspecified hearing loss, bilateral: Secondary | ICD-10-CM | POA: Diagnosis not present

## 2018-03-26 DIAGNOSIS — R490 Dysphonia: Secondary | ICD-10-CM | POA: Diagnosis not present

## 2018-03-26 DIAGNOSIS — K219 Gastro-esophageal reflux disease without esophagitis: Secondary | ICD-10-CM | POA: Diagnosis not present

## 2018-03-28 ENCOUNTER — Encounter: Payer: Self-pay | Admitting: Internal Medicine

## 2018-03-28 DIAGNOSIS — R053 Chronic cough: Secondary | ICD-10-CM | POA: Insufficient documentation

## 2018-03-28 DIAGNOSIS — R05 Cough: Secondary | ICD-10-CM | POA: Insufficient documentation

## 2018-04-01 ENCOUNTER — Ambulatory Visit (INDEPENDENT_AMBULATORY_CARE_PROVIDER_SITE_OTHER): Payer: Medicare Other | Admitting: Internal Medicine

## 2018-04-01 ENCOUNTER — Encounter: Payer: Self-pay | Admitting: Internal Medicine

## 2018-04-01 VITALS — BP 138/76 | HR 85 | Ht 71.0 in | Wt 210.8 lb

## 2018-04-01 DIAGNOSIS — R05 Cough: Secondary | ICD-10-CM | POA: Diagnosis not present

## 2018-04-01 DIAGNOSIS — R053 Chronic cough: Secondary | ICD-10-CM

## 2018-04-01 LAB — NITRIC OXIDE: Nitric Oxide: 12

## 2018-04-01 MED ORDER — MOMETASONE FURO-FORMOTEROL FUM 100-5 MCG/ACT IN AERO: 2.0000 | INHALATION_SPRAY | Freq: Two times a day (BID) | RESPIRATORY_TRACT | 11 refills | Status: DC

## 2018-04-01 MED ORDER — MOMETASONE FURO-FORMOTEROL FUM 100-5 MCG/ACT IN AERO: 2.0000 | INHALATION_SPRAY | Freq: Two times a day (BID) | RESPIRATORY_TRACT | 0 refills | Status: DC

## 2018-04-01 NOTE — Patient Instructions (Addendum)
Dulera 100 Take 2 puffs first thing in am and then another 2 puffs about 12 hours later.    Work on inhaler technique:  relax and gently blow all the way out then take a nice smooth deep breath back in, triggering the inhaler at same time you start breathing in.  Hold for up to 5 seconds if you can. Blow out thru nose. Rinse and gargle with water when done   Only use your albuterol as a rescue medication to be used if you can't catch your breath by resting or doing a relaxed purse lip breathing pattern.  - The less you use it, the better it will work when you need it. - Ok to use up to  every 4 hours if needed   Continue prilosec 40 mg Take 30-60 min before first meal of the day and add pepcid 20 mg otc after supper  GERD (REFLUX)  is an extremely common cause of respiratory symptoms just like yours , many times with no obvious heartburn at all.    It can be treated with medication, but also with lifestyle changes including elevation of the head of your bed (ideally with 6 inch  bed blocks),  Smoking cessation, avoidance of late meals, excessive alcohol, and avoid fatty foods, chocolate, peppermint, colas, red wine, and acidic juices such as orange juice.  NO MINT OR MENTHOL PRODUCTS SO NO COUGH DROPS  USE SUGARLESS CANDY INSTEAD (Jolley ranchers or Stover's or Life Savers) or even ice chips will also do - the key is to swallow to prevent all throat clearing. NO OIL BASED VITAMINS - use powdered substitutes.       Please schedule a follow up office visit in 4 weeks, sooner if needed  with all medications /inhalers/ solutions in hand so we can verify exactly what you are taking. This includes all medications from all doctors and over the counters - pfts on return / also allergy profile and repeat feno

## 2018-04-01 NOTE — Progress Notes (Signed)
Duane Park, male    DOB: [birth year],    MRN: [id number]    Brief patient profile:  64 yowm never smoker [place] and lived there until 2006 when moved to [place] around 2006 then noted cough /wheezing intermittent not clearly seasonal seems to respond to abx and prednisone s any symptoms or need for meds in between episodes x months but since first of the 2019 not 100% better even in between still feels sense of chest tightness and doe and some am hacking which can be severe but    but minimal mucus so referred to pulmonary clinic 04/01/2018 by Dr   MA finished Prednisone 03/29/18 seemed to help some but not eliminate.    04/01/2018  f/u ov/Duane Park re:  Chief Complaint  Patient presents with  . Pulmonary Consult    Referred by Dr. Gaylyn Cheers. Pt c/o cough off and on for the past 2-3 months.  He states unable to lie flat without coughing and sleeps propped up. He states his cough is occ prod with clear sputum. He uses proair 2 x per wk on average and rarely uses neb.   last nebulizer was 3-4 days prior to OV   Clearly better p prednisone most times    Dyspnea:  MMRC1 = can walk nl pace, flat grade, can't hurry or go uphills or steps s sob   Cough: sense of drainage s excess mucus     SABA use: immediately better p neb   No obvious day to day or daytime variability or assoc excess/ purulent sputum or mucus plugs or hemoptysis or cp or chest tightness, subjective wheeze or overt sinus or hb symptoms.   Sleep: either side no elevation/ one pillow without nocturnal  or early am exacerbation  of respiratory  c/o's or need for noct saba. Also denies any obvious fluctuation of symptoms with weather or environmental changes or other aggravating or alleviating factors except as outlined above   No unusual exposure hx or h/o childhood pna/ asthma or knowledge of premature birth.  Current Allergies, Complete Past Medical History, Past Surgical History, Family History,  and Social History were reviewed in [employer] record.  ROS  The following are not active complaints unless bolded Hoarseness, sore throat, dysphagia, dental problems, itching, sneezing,  nasal congestion or discharge of excess mucus or purulent secretions, ear ache,   fever, chills, sweats, unintended wt loss or wt gain, classically pleuritic or exertional cp,  orthopnea pnd or arm/hand swelling  or leg swelling, presyncope, palpitations, abdominal pain, anorexia, nausea, vomiting, diarrhea  or change in bowel habits or change in bladder habits, change in stools or change in urine, dysuria, hematuria,  rash, arthralgias, visual complaints, headache, numbness, weakness or ataxia or problems with walking or coordination,  change in mood or  memory.            Outpatient Medications Prior to Visit  Medication Sig Dispense Refill  . albuterol (PROAIR HFA) 108 (90 Base) MCG/ACT inhaler Inhale 2 puffs into the lungs every 6 (six) hours as needed for wheezing or shortness of breath.    Marland Kitchen ipratropium-albuterol (DUONEB) 0.5-2.5 (3) MG/3ML SOLN Take 3 mLs by nebulization every 4 (four) hours as needed.    . montelukast (SINGULAIR) 10 MG tablet Take 10 mg by mouth at bedtime.    Marland Kitchen olmesartan-hydrochlorothiazide (BENICAR HCT) 40-25 MG tablet Take 1 tablet by mouth daily.    Marland Kitchen omeprazole (PRILOSEC)  40 MG capsule Take 40 mg by mouth daily.    Marland Kitchen Phytosterol Esters (CHOLEST CARE) 500 MG CAPS Take 1 Can by mouth daily.    . Psyllium (EQ DAILY FIBER PO) Take 1 capsule by mouth daily.    Marland Kitchen UNABLE TO FIND Med Name: Curamin OTC daily             Objective:     BP 138/76 (BP Location: Left Arm, Cuff Size: Normal)   Pulse 85   Ht 5\' 11"  (1.803 m)   Wt 210 lb 12.8 oz (95.6 kg)   SpO2 96%   BMI 29.40 kg/m   SpO2: 96 % RA   HEENT: nl dentition,   and oropharynx. Nl external ear canals without cough reflex - moderate bilateral non-specific turbinate edema     NECK :  without  JVD/Nodes/TM/ nl carotid upstrokes bilaterally   LUNGS: no acc muscle use,  Nl contour chest with "wheeze" on fvc, better with plm   CV:  RRR  no s3 or murmur or increase in P2, and no edema   ABD:  soft and nontender with nl inspiratory excursion in the supine position. No bruits or organomegaly appreciated, bowel sounds nl  MS:  Nl gait/ ext warm without deformities, calf tenderness, cyanosis or clubbing No obvious joint restrictions   SKIN: warm and dry without lesions    NEURO:  alert, approp, nl sensorium with  no motor or cerebellar deficits apparent.           Assessment   Chronic cough ent eval by Dr Gaylyn Cheers  03/26/18 neg direct laryngoscopy - FENO 04/01/2018  =   12  - 04/01/2018  After extensive coaching inhaler device  effectiveness =    75% from a baseline of 25 % try dulera 100 2bid    The most common causes of chronic cough in immunocompetent adults include the following: upper airway cough syndrome (UACS), previously referred to as postnasal drip syndrome (PNDS), which is caused by variety of rhinosinus conditions; (2) asthma; (3) GERD; (4) chronic bronchitis from cigarette smoking or other inhaled environmental irritants; (5) nonasthmatic eosinophilic bronchitis; and (6) bronchiectasis.   These conditions, singly or in combination, have accounted for up to 94% of the causes of chronic cough in prospective studies.   Other conditions have constituted no >6% of the causes in prospective studies These have included bronchogenic carcinoma, chronic interstitial pneumonia, sarcoidosis, left ventricular failure, ACEI-induced cough, and aspiration from a condition associated with pharyngeal dysfunction.    Chronic cough is often simultaneously caused by more than one condition. A single cause has been found from 38 to 82% of the time, multiple causes from 18 to 62%. Multiply caused cough has been the result of three diseases up to 42% of the time.       ddx is really between  cough variant asthma and Upper airway cough syndrome (previously labeled PNDS),  is so named because it's frequently impossible to sort out how much is  CR/sinusitis with freq throat clearing (which can be related to primary GERD)   vs  causing  secondary (" extra esophageal")  GERD from wide swings in gastric pressure that occur with throat clearing, often  promoting self use of mint and menthol lozenges that reduce the lower esophageal sphincter tone and exacerbate the problem further in a cyclical fashion.   These are the same pts (now being labeled as having "irritable larynx syndrome" by some cough centers) who not infrequently have a  history of having failed to tolerate ace inhibitors(presumable the case as is now on ARB) ,  dry powder inhalers or biphosphonates or report having atypical/extraesophageal reflux symptoms that don't respond to standard doses of PPI  and are easily confused as having aecopd or asthma flares by even experienced allergists/ pulmonologists (myself included).     rec trial of low dose dulera in addition to continuing singulair for now (but doubt it's doing much if it hasn't interupted this pattern so far) and max rx for gerd and return for pfts/ allergy w/u next steps (just finished prednisone so Eos may still be suppressed and relatively FENO may also be misleading in this setting so needs to be repeated on return)     Total time devoted to counseling  > 50 % of initial 60 min office visit:  review case with pt/ discussion of options/alternatives/ personally creating written customized instructions  in presence of pt  then going over those specific  Instructions directly with the pt including how to use all of the meds but in particular covering each new medication in detail and the difference between the maintenance= "automatic" meds and the prns using an action plan format for the latter (If this problem/symptom => do that organization reading Left to right).  Please see  AVS from this visit for a full list of these instructions which I personally wrote for this pt and  are unique to this visit.      Christinia Gully, MD 04/01/2018

## 2018-04-02 ENCOUNTER — Encounter: Payer: Self-pay | Admitting: Internal Medicine

## 2018-04-02 NOTE — Assessment & Plan Note (Addendum)
ent eval by Dr Gaylyn Cheers  03/26/18 neg direct laryngoscopy - FENO 04/01/2018  =   12  - 04/01/2018  After extensive coaching inhaler device  effectiveness =    75% from a baseline of 25 % try dulera 100 2bid    The most common causes of chronic cough in immunocompetent adults include the following: upper airway cough syndrome (UACS), previously referred to as postnasal drip syndrome (PNDS), which is caused by variety of rhinosinus conditions; (2) asthma; (3) GERD; (4) chronic bronchitis from cigarette smoking or other inhaled environmental irritants; (5) nonasthmatic eosinophilic bronchitis; and (6) bronchiectasis.   These conditions, singly or in combination, have accounted for up to 94% of the causes of chronic cough in prospective studies.   Other conditions have constituted no >6% of the causes in prospective studies These have included bronchogenic carcinoma, chronic interstitial pneumonia, sarcoidosis, left ventricular failure, ACEI-induced cough, and aspiration from a condition associated with pharyngeal dysfunction.    Chronic cough is often simultaneously caused by more than one condition. A single cause has been found from 38 to 82% of the time, multiple causes from 18 to 62%. Multiply caused cough has been the result of three diseases up to 42% of the time.       ddx is really between cough variant asthma and Upper airway cough syndrome (previously labeled PNDS),  is so named because it's frequently impossible to sort out how much is  CR/sinusitis with freq throat clearing (which can be related to primary GERD)   vs  causing  secondary (" extra esophageal")  GERD from wide swings in gastric pressure that occur with throat clearing, often  promoting self use of mint and menthol lozenges that reduce the lower esophageal sphincter tone and exacerbate the problem further in a cyclical fashion.   These are the same pts (now being labeled as having "irritable larynx syndrome" by some cough centers) who  not infrequently have a history of having failed to tolerate ace inhibitors(presumable the case as is now on ARB) ,  dry powder inhalers or biphosphonates or report having atypical/extraesophageal reflux symptoms that don't respond to standard doses of PPI  and are easily confused as having aecopd or asthma flares by even experienced allergists/ pulmonologists (myself included).     rec trial of low dose dulera in addition to continuing singulair for now (but doubt it's doing much if it hasn't interupted this pattern so far) and max rx for gerd and return for pfts/ allergy w/u next steps (just finished prednisone so Eos may still be suppressed and relatively FENO may also be misleading in this setting so needs to be repeated on return)     Total time devoted to counseling  > 50 % of initial 60 min office visit:  review case with pt/ discussion of options/alternatives/ personally creating written customized instructions  in presence of pt  then going over those specific  Instructions directly with the pt including how to use all of the meds but in particular covering each new medication in detail and the difference between the maintenance= "automatic" meds and the prns using an action plan format for the latter (If this problem/symptom => do that organization reading Left to right).  Please see AVS from this visit for a full list of these instructions which I personally wrote for this pt and  are unique to this visit.

## 2018-04-12 ENCOUNTER — Emergency Department (HOSPITAL_COMMUNITY): Payer: Medicare Other

## 2018-04-12 ENCOUNTER — Other Ambulatory Visit: Payer: Self-pay

## 2018-04-12 ENCOUNTER — Encounter (HOSPITAL_COMMUNITY): Payer: Self-pay | Admitting: Radiology

## 2018-04-12 ENCOUNTER — Inpatient Hospital Stay (HOSPITAL_COMMUNITY)
Admission: EM | Admit: 2018-04-12 | Discharge: 2018-04-14 | DRG: 065 | Disposition: A | Discharge status: Rehabilitation Facility | Payer: Medicare Other | Attending: Internal Medicine | Admitting: Internal Medicine

## 2018-04-12 DIAGNOSIS — I10 Essential (primary) hypertension: Secondary | ICD-10-CM | POA: Diagnosis not present

## 2018-04-12 DIAGNOSIS — K219 Gastro-esophageal reflux disease without esophagitis: Secondary | ICD-10-CM | POA: Diagnosis not present

## 2018-04-12 DIAGNOSIS — D7589 Other specified diseases of blood and blood-forming organs: Secondary | ICD-10-CM | POA: Diagnosis not present

## 2018-04-12 DIAGNOSIS — I6389 Other cerebral infarction: Secondary | ICD-10-CM | POA: Diagnosis not present

## 2018-04-12 DIAGNOSIS — R4781 Slurred speech: Secondary | ICD-10-CM | POA: Diagnosis not present

## 2018-04-12 DIAGNOSIS — J449 Chronic obstructive pulmonary disease, unspecified: Secondary | ICD-10-CM | POA: Diagnosis present

## 2018-04-12 DIAGNOSIS — Z8249 Family history of ischemic heart disease and other diseases of the circulatory system: Secondary | ICD-10-CM | POA: Diagnosis not present

## 2018-04-12 DIAGNOSIS — Z7951 Long term (current) use of inhaled steroids: Secondary | ICD-10-CM | POA: Diagnosis not present

## 2018-04-12 DIAGNOSIS — M159 Polyosteoarthritis, unspecified: Secondary | ICD-10-CM | POA: Diagnosis not present

## 2018-04-12 DIAGNOSIS — E785 Hyperlipidemia, unspecified: Secondary | ICD-10-CM | POA: Diagnosis not present

## 2018-04-12 DIAGNOSIS — I440 Atrioventricular block, first degree: Secondary | ICD-10-CM | POA: Diagnosis not present

## 2018-04-12 DIAGNOSIS — R471 Dysarthria and anarthria: Secondary | ICD-10-CM | POA: Diagnosis not present

## 2018-04-12 DIAGNOSIS — I6330 Cerebral infarction due to thrombosis of unspecified cerebral artery: Secondary | ICD-10-CM

## 2018-04-12 DIAGNOSIS — Z713 Dietary counseling and surveillance: Secondary | ICD-10-CM | POA: Diagnosis not present

## 2018-04-12 DIAGNOSIS — I5189 Other ill-defined heart diseases: Secondary | ICD-10-CM

## 2018-04-12 DIAGNOSIS — G8194 Hemiplegia, unspecified affecting left nondominant side: Secondary | ICD-10-CM | POA: Diagnosis present

## 2018-04-12 DIAGNOSIS — I6381 Other cerebral infarction due to occlusion or stenosis of small artery: Secondary | ICD-10-CM | POA: Diagnosis not present

## 2018-04-12 DIAGNOSIS — I110 Hypertensive heart disease with heart failure: Secondary | ICD-10-CM | POA: Diagnosis present

## 2018-04-12 DIAGNOSIS — R2689 Other abnormalities of gait and mobility: Secondary | ICD-10-CM | POA: Diagnosis present

## 2018-04-12 DIAGNOSIS — I639 Cerebral infarction, unspecified: Secondary | ICD-10-CM | POA: Diagnosis not present

## 2018-04-12 DIAGNOSIS — K141 Geographic tongue: Secondary | ICD-10-CM | POA: Diagnosis present

## 2018-04-12 DIAGNOSIS — I6501 Occlusion and stenosis of right vertebral artery: Secondary | ICD-10-CM | POA: Diagnosis not present

## 2018-04-12 DIAGNOSIS — R531 Weakness: Secondary | ICD-10-CM | POA: Diagnosis not present

## 2018-04-12 DIAGNOSIS — Z79899 Other long term (current) drug therapy: Secondary | ICD-10-CM | POA: Diagnosis not present

## 2018-04-12 DIAGNOSIS — I509 Heart failure, unspecified: Secondary | ICD-10-CM | POA: Diagnosis present

## 2018-04-12 DIAGNOSIS — N183 Chronic kidney disease, stage 3 unspecified: Secondary | ICD-10-CM

## 2018-04-12 DIAGNOSIS — I6359 Cerebral infarction due to unspecified occlusion or stenosis of other cerebral artery: Secondary | ICD-10-CM | POA: Diagnosis not present

## 2018-04-12 DIAGNOSIS — I69392 Facial weakness following cerebral infarction: Secondary | ICD-10-CM | POA: Diagnosis not present

## 2018-04-12 DIAGNOSIS — Z66 Do not resuscitate: Secondary | ICD-10-CM | POA: Diagnosis present

## 2018-04-12 DIAGNOSIS — R29810 Facial weakness: Secondary | ICD-10-CM | POA: Diagnosis present

## 2018-04-12 DIAGNOSIS — I739 Peripheral vascular disease, unspecified: Secondary | ICD-10-CM | POA: Diagnosis not present

## 2018-04-12 DIAGNOSIS — R05 Cough: Secondary | ICD-10-CM | POA: Diagnosis not present

## 2018-04-12 DIAGNOSIS — I69398 Other sequelae of cerebral infarction: Secondary | ICD-10-CM | POA: Diagnosis not present

## 2018-04-12 DIAGNOSIS — I69393 Ataxia following cerebral infarction: Secondary | ICD-10-CM | POA: Diagnosis not present

## 2018-04-12 DIAGNOSIS — E7800 Pure hypercholesterolemia, unspecified: Secondary | ICD-10-CM | POA: Diagnosis present

## 2018-04-12 DIAGNOSIS — I69354 Hemiplegia and hemiparesis following cerebral infarction affecting left non-dominant side: Secondary | ICD-10-CM | POA: Diagnosis not present

## 2018-04-12 DIAGNOSIS — R0982 Postnasal drip: Secondary | ICD-10-CM | POA: Diagnosis not present

## 2018-04-12 DIAGNOSIS — I6503 Occlusion and stenosis of bilateral vertebral arteries: Secondary | ICD-10-CM | POA: Diagnosis not present

## 2018-04-12 DIAGNOSIS — W19XXXA Unspecified fall, initial encounter: Secondary | ICD-10-CM | POA: Diagnosis not present

## 2018-04-12 DIAGNOSIS — I6529 Occlusion and stenosis of unspecified carotid artery: Secondary | ICD-10-CM | POA: Diagnosis not present

## 2018-04-12 DIAGNOSIS — R7989 Other specified abnormal findings of blood chemistry: Secondary | ICD-10-CM | POA: Diagnosis not present

## 2018-04-12 DIAGNOSIS — G8929 Other chronic pain: Secondary | ICD-10-CM | POA: Diagnosis not present

## 2018-04-12 DIAGNOSIS — R269 Unspecified abnormalities of gait and mobility: Secondary | ICD-10-CM | POA: Diagnosis not present

## 2018-04-12 DIAGNOSIS — J45909 Unspecified asthma, uncomplicated: Secondary | ICD-10-CM | POA: Diagnosis not present

## 2018-04-12 DIAGNOSIS — I700 Atherosclerosis of aorta: Secondary | ICD-10-CM

## 2018-04-12 HISTORY — DX: Pure hypercholesterolemia, unspecified: E78.00

## 2018-04-12 HISTORY — DX: Essential (primary) hypertension: I10

## 2018-04-12 HISTORY — DX: Chronic obstructive pulmonary disease, unspecified: J44.9

## 2018-04-12 LAB — URINALYSIS, ROUTINE W REFLEX MICROSCOPIC
Bilirubin Urine: NEGATIVE
Glucose, UA: NEGATIVE mg/dL
Hgb urine dipstick: NEGATIVE
Ketones, ur: NEGATIVE mg/dL
Leukocytes, UA: NEGATIVE
Nitrite: NEGATIVE
Protein, ur: NEGATIVE mg/dL
Specific Gravity, Urine: 1.042 — ABNORMAL HIGH (ref 1.005–1.030)
pH: 7 (ref 5.0–8.0)

## 2018-04-12 LAB — COMPREHENSIVE METABOLIC PANEL
ALT: 12 U/L (ref 0–44)
AST: 18 U/L (ref 15–41)
Albumin: 3.3 g/dL — ABNORMAL LOW (ref 3.5–5.0)
Alkaline Phosphatase: 80 U/L (ref 38–126)
Anion gap: 10 (ref 5–15)
BUN: 40 mg/dL — ABNORMAL HIGH (ref 8–23)
CO2: 22 mmol/L (ref 22–32)
Calcium: 8.9 mg/dL (ref 8.9–10.3)
Chloride: 105 mmol/L (ref 98–111)
Creatinine, Ser: 1.41 mg/dL — ABNORMAL HIGH (ref 0.61–1.24)
GFR calc Af Amer: 52 mL/min — ABNORMAL LOW (ref >60)
GFR calc non Af Amer: 44 mL/min — ABNORMAL LOW (ref >60)
Glucose, Bld: 92 mg/dL (ref 70–99)
Potassium: 4.1 mmol/L (ref 3.5–5.1)
Sodium: 137 mmol/L (ref 135–145)
Total Bilirubin: 1.4 mg/dL — ABNORMAL HIGH (ref 0.3–1.2)
Total Protein: 5.7 g/dL — ABNORMAL LOW (ref 6.5–8.1)

## 2018-04-12 LAB — RAPID URINE DRUG SCREEN, HOSP PERFORMED
Amphetamines: NOT DETECTED
Barbiturates: NOT DETECTED
Benzodiazepines: NOT DETECTED
Cocaine: NOT DETECTED
Opiates: NOT DETECTED
Tetrahydrocannabinol: NOT DETECTED

## 2018-04-12 LAB — CBC
HCT: 49 % (ref 39.0–52.0)
Hemoglobin: 16.4 g/dL (ref 13.0–17.0)
MCH: 34.7 pg — ABNORMAL HIGH (ref 26.0–34.0)
MCHC: 33.5 g/dL (ref 30.0–36.0)
MCV: 103.6 fL — ABNORMAL HIGH (ref 78.0–100.0)
Platelets: 205 10*3/uL (ref 150–400)
RBC: 4.73 MIL/uL (ref 4.22–5.81)
RDW: 12.2 % (ref 11.5–15.5)
WBC: 9.3 10*3/uL (ref 4.0–10.5)

## 2018-04-12 LAB — I-STAT CHEM 8, ED
BUN: 40 mg/dL — ABNORMAL HIGH (ref 8–23)
Calcium, Ion: 1.12 mmol/L — ABNORMAL LOW (ref 1.15–1.40)
Chloride: 103 mmol/L (ref 98–111)
Creatinine, Ser: 1.3 mg/dL — ABNORMAL HIGH (ref 0.61–1.24)
Glucose, Bld: 92 mg/dL (ref 70–99)
HCT: 48 % (ref 39.0–52.0)
Hemoglobin: 16.3 g/dL (ref 13.0–17.0)
Potassium: 4 mmol/L (ref 3.5–5.1)
Sodium: 139 mmol/L (ref 135–145)
TCO2: 25 mmol/L (ref 22–32)

## 2018-04-12 LAB — DIFFERENTIAL
Abs Immature Granulocytes: 0.1 10*3/uL (ref 0.0–0.1)
Basophils Absolute: 0.1 10*3/uL (ref 0.0–0.1)
Basophils Relative: 1 %
Eosinophils Absolute: 0.4 10*3/uL (ref 0.0–0.7)
Eosinophils Relative: 4 %
Immature Granulocytes: 1 %
Lymphocytes Relative: 28 %
Lymphs Abs: 2.6 10*3/uL (ref 0.7–4.0)
Monocytes Absolute: 1.1 10*3/uL — ABNORMAL HIGH (ref 0.1–1.0)
Monocytes Relative: 12 %
Neutro Abs: 5.1 10*3/uL (ref 1.7–7.7)
Neutrophils Relative %: 54 %

## 2018-04-12 LAB — I-STAT TROPONIN, ED: Troponin i, poc: 0 ng/mL (ref 0.00–0.08)

## 2018-04-12 LAB — PROTIME-INR
INR: 1.06
Prothrombin Time: 13.7 seconds (ref 11.4–15.2)

## 2018-04-12 LAB — ETHANOL: Alcohol, Ethyl (B): <10 mg/dL (ref <10)

## 2018-04-12 LAB — APTT: aPTT: 29 seconds (ref 24–36)

## 2018-04-12 MED ORDER — ENOXAPARIN SODIUM 40 MG/0.4ML ~~LOC~~ SOLN
40.0000 mg | SUBCUTANEOUS | Status: DC
  Administered 2018-04-12 – 2018-04-14 (×3): 40 mg via SUBCUTANEOUS
  Filled 2018-04-12 (×4): qty 0.4

## 2018-04-12 MED ORDER — PANTOPRAZOLE SODIUM 40 MG PO TBEC
80.0000 mg | DELAYED_RELEASE_TABLET | Freq: Every day | ORAL | Status: DC
  Administered 2018-04-12 – 2018-04-14 (×3): 80 mg via ORAL
  Filled 2018-04-12 (×3): qty 2

## 2018-04-12 MED ORDER — MOMETASONE FURO-FORMOTEROL FUM 100-5 MCG/ACT IN AERO: 2.0000 | INHALATION_SPRAY | Freq: Two times a day (BID) | RESPIRATORY_TRACT | Status: DC

## 2018-04-12 MED ORDER — ASPIRIN EC 325 MG PO TBEC
325.0000 mg | DELAYED_RELEASE_TABLET | Freq: Once | ORAL | Status: AC
  Administered 2018-04-12: 325 mg via ORAL
  Filled 2018-04-12: qty 1

## 2018-04-12 MED ORDER — PSYLLIUM 95 % PO PACK
1.0000 | PACK | Freq: Every day | ORAL | Status: DC
  Administered 2018-04-12 – 2018-04-14 (×3): 1 via ORAL
  Filled 2018-04-12 (×3): qty 1

## 2018-04-12 MED ORDER — ATORVASTATIN CALCIUM 80 MG PO TABS
80.0000 mg | ORAL_TABLET | Freq: Every day | ORAL | Status: DC
  Administered 2018-04-12 – 2018-04-13 (×2): 80 mg via ORAL
  Filled 2018-04-12 (×2): qty 1

## 2018-04-12 MED ORDER — IOPAMIDOL (ISOVUE-370) INJECTION 76%
INTRAVENOUS | Status: AC
  Administered 2018-04-12: 90 mL
  Filled 2018-04-12: qty 100

## 2018-04-12 NOTE — Consult Note (Signed)
Neurology Consultation  Reason for Consult: Code stroke Referring Physician: Dr. Christy Gentles  CC: Left-sided weakness, dysarthria  History is obtained from: Patient, EMS  HPI: Duane Park is a 84 y.o. male past medical history of hypertension, hyperlipidemia, COPD, who was in his usual state of health when he went to bed at 11:30 PM on 04/11/2018 and woke up this morning with left facial droop, left arm and leg weakness and slurred speech. He has never had these symptoms before.  He was not sick prior to this presentation. He denies any headaches, tingling or numbness.  Denies any chest pain shortness of breath.  Denies any cough.  Denies bleeding or bruising. EMS was called, evaluated him on scene, symptoms consistent with a stroke and brought in from [facility] as he was outside the 4-1/2 hours window for IV TPA but was still within the window for intervention per the County/hospital protocol. He did not have any symptoms suggestive of large vessel occlusion based on the description of symptoms by EMS. Patient's awake alert oriented x3 and able to provide reliable history.  No family members at bedside at this time.   LKW: 11:30 PM on 04/11/2018 tpa given?: no, outside the window Premorbid modified Rankin scale (mRS): 0  ROS: ROS was performed and is negative except as noted in the HPI.  No past medical history on file. Hypertension, COPD, hyperlipidemia  Family History  Problem Relation Age of Onset  . Lung disease Mother        never smoker  . Heart disease Father    Social History:   reports that he has never smoked. He has never used smokeless tobacco. His alcohol and drug histories are not on file.  Medications No current facility-administered medications for this encounter.   Current Outpatient Medications:  .  albuterol (PROAIR HFA) 108 (90 Base) MCG/ACT inhaler, Inhale 2 puffs into the lungs every 6 (six) hours as needed for wheezing or shortness of breath., Disp: ,  Rfl:  .  ipratropium-albuterol (DUONEB) 0.5-2.5 (3) MG/3ML SOLN, Take 3 mLs by nebulization every 4 (four) hours as needed., Disp: , Rfl:  .  mometasone-formoterol (DULERA) 100-5 MCG/ACT AERO, Inhale 2 puffs into the lungs 2 (two) times daily., Disp: 1 Inhaler, Rfl: 11 .  montelukast (SINGULAIR) 10 MG tablet, Take 10 mg by mouth at bedtime., Disp: , Rfl:  .  olmesartan-hydrochlorothiazide (BENICAR HCT) 40-25 MG tablet, Take 1 tablet by mouth daily., Disp: , Rfl:  .  omeprazole (PRILOSEC) 40 MG capsule, Take 40 mg by mouth daily., Disp: , Rfl:  .  Phytosterol Esters (CHOLEST CARE) 500 MG CAPS, Take 1 Can by mouth daily., Disp: , Rfl:  .  Psyllium (EQ DAILY FIBER PO), Take 1 capsule by mouth daily., Disp: , Rfl:  .  UNABLE TO FIND, Med Name: Curamin OTC daily, Disp: , Rfl:   Exam: Current vital signs: SpO2 97%  Vital signs in last 24 hours:   146/82 Heart rate 78  GENERAL: Awake, alert in NAD HEENT: - Normocephalic and atraumatic, dry mm, no LN++, no Thyromegally LUNGS - Clear to auscultation bilaterally with no wheezes CV - S1S2 RRR, no m/r/g, equal pulses bilaterally. ABDOMEN - Soft, nontender, nondistended with normoactive BS Ext: warm, well perfused, intact peripheral pulses, no edema  NEURO:  Mental Status: AA&Ox3  Language: speech is dysarthric.  Naming, repetition, fluency, and comprehension intact. Cranial Nerves: PERRL. EOMI, visual fields full, flattening of left nasolabial fold and left lower facial weakness at rest,  still able to recruit all muscles while smiling,, facial sensation intact, hearing intact, tongue/uvula/soft palate midline, normal sternocleidomastoid and trapezius muscle strength. No evidence of tongue atrophy or fibrillations Motor: 5/5 right upper and right lower extremity. 4/5 left upper, 4/5 left lower extremity with vertical drift in both upper and lower extremity. Tone: is normal and bulk is normal Sensation- Intact to light touch  bilaterally Coordination: Finger-to-nose with mild dysmetria on the left disproportionate to weakness.  Intact on the right. Gait- deferred  NIHSS 1a Level of Conscious.: 0 1b LOC Questions: 0 1c LOC Commands:0  2 Best Gaze: 0 3 Visual: 0 4 Facial Palsy: 1 5a Motor Arm - left:1  5b Motor Arm - Right: 0 6a Motor Leg - Left: 1 6b Motor Leg - Right: 0 7 Limb Ataxia: 1 8 Sensory: 0 9 Best Language: 0 10 Dysarthria: 1 11 Extinct. and Inatten.: 0 TOTAL: 5  Labs I have reviewed labs in epic and the results pertinent to this consultation are:  CBC    Component Value Date/Time   WBC 8.8 08/31/2010 1259   RBC 4.82 08/31/2010 1259   HGB 16.7 08/31/2010 1259   HCT 48.7 08/31/2010 1259   PLT 198 08/31/2010 1259   MCV 101.0 (H) 08/31/2010 1259   MCH 34.6 (H) 08/31/2010 1259   MCHC 34.3 08/31/2010 1259   RDW 12.4 08/31/2010 1259   CMP     Component Value Date/Time   NA 139 08/31/2010 1259   K 3.8 08/31/2010 1259   CL 104 08/31/2010 1259   CO2 27 08/31/2010 1259   GLUCOSE 100 (H) 08/31/2010 1259   BUN 40 (H) 08/31/2010 1259   CREATININE 1.12 08/31/2010 1259   CALCIUM 9.2 08/31/2010 1259   GFRNONAA >60 08/31/2010 1259   GFRAA  08/31/2010 1259    >60        The eGFR has been calculated using the MDRD equation. This calculation has not been validated in all clinical situations. eGFR's persistently <60 mL/min signify possible Chronic Kidney Disease.    Imaging I have reviewed the images obtained: CT-scan of the brain- no acute changes, ASPECTS 10 CTA H+N: No emergent LVO.  Aortic arch calcification, mild calcification and bifurcations of the carotids with no significant stenosis.  Patent posterior circulation, left fetal PCA, possibly right PCA also fetal origin. CT perfusion: no deficit MRI pending at this time.  Assessment:  84 year old man last seen normal 11:30 PM on 04/11/2018 woke up this morning with left-sided facial droop, left arm and leg weakness as well as  slurred speech. Exam consistent with left-sided hemiparesis and dysarthria and mild left upper extremity ataxia. 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 Blood pressures can be gradually normalized to SBP<140 upon discharge. -MRI brain without contrast -Echocardiogram -HgbA1c, fasting lipid panel -Frequent neuro checks -Prophylactic therapy-Antiplatelet med: Aspirin - dose 342m PO or 3034mPR -Atorvastatin 80 mg PO daily -Risk factor modification -PT consult, OT consult, Speech consult  Please page stroke NP/PA/MD (listed on AMION)  from 8am-4 pm as this patient will be followed by the stroke team at this point.  -- AsAmie PortlandMD [facility] Pager: [phone] 7pm to 7am, please call on call as listed on AMION.

## 2018-04-12 NOTE — H&P (Signed)
04/12/2018             Duane Park MRN: [id number]  [birth year] Age / Sex: 84 y.o., male   Duane Right, MD            Internal Medicine Teaching Service            Dr. Oval Linsey, MD                             Chief Complaint: "I was stumbling and felt weak on my left side"   History of Present Illness: Duane Park is an 84 year old male who presented to the ER after feeling as though he was "unwell", stumbling, and experiencing left sided weakness. Yesterday (8/2) around 1500, Duane Park began to experience the stumbling. He states that he normally "shuffles" but the stumbling was abnormal for him. Around 2330, he began to experience left sided weakness in his face, arm and leg as well as some slurring of his speech. He awoke this morning (8/3) with persistent symptoms and decided to come to the ER.  He has never experienced symptoms like this before.   Duane. Park presented as a code stroke to the ED just before 0700 on 8/3. He was screened and taken to the CT scanner, which was interpreted as unremarkable for the age (ASPECTS 10). He also received a CTA neck which showed no emergent large vessel occlusion. At the time of scanning, he was beyond the window for treatment with thrombolytic agents and the decision was made to admit for support treatment and monitoring.   Duane Park has previously been treated for hypertension and hyperlipidemia but does not have other cardiac or vascular history. His family history is significant for "heart problems" in his mother and father, although he was unable to elaborate further. He had one brother who passed away at birth. He has never smoked and hasn't drank alcohol in 30 years. He has never used recreational drugs. He takes three supplements; a "cholesterol supplement" (taken for treatment of hyperlipidemia), fiber, and curamin PRN for pain. He states that he generally eats out and he enjoys eating meats and fish. He does not regularly exercise.    Meds: No current facility-administered medications on file prior to encounter.    Current Outpatient Medications on File Prior to Encounter  Medication Sig Dispense Refill  . albuterol (PROAIR HFA) 108 (90 Base) MCG/ACT inhaler Inhale 2 puffs into the lungs every 6 (six) hours as needed for wheezing or shortness of breath.    Marland Kitchen ipratropium-albuterol (DUONEB) 0.5-2.5 (3) MG/3ML SOLN Take 3 mLs by nebulization every 4 (four) hours as needed.    . mometasone-formoterol (DULERA) 100-5 MCG/ACT AERO Inhale 2 puffs into the lungs 2 (two) times daily. 1 Inhaler 11  . montelukast (SINGULAIR) 10 MG tablet Take 10 mg by mouth at bedtime.    Marland Kitchen olmesartan-hydrochlorothiazide (BENICAR HCT) 40-25 MG tablet Take 1 tablet by mouth daily.    Marland Kitchen omeprazole (PRILOSEC) 40 MG capsule Take 40 mg by mouth daily.    Marland Kitchen Phytosterol Esters (CHOLEST CARE) 500 MG CAPS Take 1 Can by mouth daily.    . Psyllium (EQ DAILY FIBER PO) Take 1 capsule by mouth daily.    Marland Kitchen UNABLE TO FIND Med Name: Curamin OTC daily      Allergies: Allergies as of 04/12/2018  . (No Known Allergies)   Past Medical History:  Diagnosis Date  . COPD (chronic  obstructive pulmonary disease) ([place])   . Hypercholesteremia   . Hypertension    Past Surgical History:  Procedure Laterality Date  . BACK SURGERY     Family History  Problem Relation Age of Onset  . Lung disease Mother        never smoker  . Heart disease Father    Social History   Socioeconomic History  . Marital status: Married    Spouse name: Not on file  . Number of children: Not on file  . Years of education: Not on file  . Highest education level: Not on file  Occupational History  . Not on file  Social Needs  . Financial resource strain: Not on file  . Food insecurity:    Worry: Not on file    Inability: Not on file  . Transportation needs:    Medical: Not on file    Non-medical: Not on file  Tobacco Use  . Smoking status: Never Smoker  . Smokeless tobacco:  Never Used  Substance and Sexual Activity  . Alcohol use: Not Currently    Frequency: Never  . Drug use: Never  . Sexual activity: Not on file  Lifestyle  . Physical activity:    Days per week: Not on file    Minutes per session: Not on file  . Stress: Not on file  Relationships  . Social connections:    Talks on phone: Not on file    Gets together: Not on file    Attends religious service: Not on file    Active member of club or organization: Not on file    Attends meetings of clubs or organizations: Not on file    Relationship status: Not on file  . Intimate partner violence:    Fear of current or ex partner: Not on file    Emotionally abused: Not on file    Physically abused: Not on file    Forced sexual activity: Not on file  Other Topics Concern  . Not on file  Social History Narrative  . Not on file    Review of Systems: Review of Systems  Constitutional: Negative for chills and fever.  HENT: Positive for hearing loss. Negative for ear discharge and sinus pain.   Respiratory: Negative for cough and shortness of breath.   Cardiovascular: Negative for chest pain, orthopnea and claudication.  Gastrointestinal: Negative for abdominal pain.  Skin: Negative for itching and rash.  Neurological: Positive for tingling, sensory change, speech change, focal weakness and weakness.    Physical Exam: Blood pressure 140/72, pulse 63, temperature 98.1 F (36.7 C), temperature source Oral, resp. rate 16, SpO2 98 %. General appearance: alert, cooperative, appears stated age and no distress  Physical Exam  Constitutional: He is oriented to person, place, and time and well-developed, well-nourished, and in no distress.  HENT:  Head: Normocephalic and atraumatic.  Mouth/Throat: Oropharynx is clear and moist.  Geographic tongue noted  Eyes: Pupils are equal, round, and reactive to light. EOM are normal. Park eye exhibits no discharge. Left eye exhibits no discharge. No scleral  icterus.  Neck: Neck supple. No tracheal deviation present.  Cardiovascular: Normal rate, regular rhythm, normal heart sounds and intact distal pulses. Exam reveals no gallop and no friction rub.  No murmur heard. Pulmonary/Chest: Effort normal and breath sounds normal. No respiratory distress. He has no wheezes. He has no rales.  Abdominal: Soft. He exhibits no distension. There is no tenderness. There is no guarding.  Musculoskeletal: He exhibits  no edema or tenderness.  Neurological: He is alert and oriented to person, place, and time. He displays weakness, facial asymmetry and abnormal speech. A cranial nerve deficit and sensory deficit is present. He has an abnormal Cerebellar Exam. He shows pronator drift. Coordination abnormal.  L Facial droop, L arm weakness 4/5, L leg weakness 4/5, dysarthria, numbness in L hand (primarily in 2nd digit) dysmetria noted - dysdiadochokinesis and finger-to-nose    Skin: Skin is warm, dry and intact.  Psychiatric: Mood and affect normal.     Lab results: Results for orders placed or performed during the hospital encounter of 04/12/18 (from the past 24 hour(s))  Protime-INR     Status: None   Collection Time: 04/12/18  6:54 AM  Result Value Ref Range   Prothrombin Time 13.7 11.4 - 15.2 seconds   INR 1.06   APTT     Status: None   Collection Time: 04/12/18  6:54 AM  Result Value Ref Range   aPTT 29 24 - 36 seconds  CBC     Status: Abnormal   Collection Time: 04/12/18  6:54 AM  Result Value Ref Range   WBC 9.3 4.0 - 10.5 K/uL   RBC 4.73 4.22 - 5.81 MIL/uL   Hemoglobin 16.4 13.0 - 17.0 g/dL   HCT 49.0 39.0 - 52.0 %   MCV 103.6 (H) 78.0 - 100.0 fL   MCH 34.7 (H) 26.0 - 34.0 pg   MCHC 33.5 30.0 - 36.0 g/dL   RDW 12.2 11.5 - 15.5 %   Platelets 205 150 - 400 K/uL  Differential     Status: Abnormal   Collection Time: 04/12/18  6:54 AM  Result Value Ref Range   Neutrophils Relative % 54 %   Neutro Abs 5.1 1.7 - 7.7 K/uL   Lymphocytes Relative 28  %   Lymphs Abs 2.6 0.7 - 4.0 K/uL   Monocytes Relative 12 %   Monocytes Absolute 1.1 (H) 0.1 - 1.0 K/uL   Eosinophils Relative 4 %   Eosinophils Absolute 0.4 0.0 - 0.7 K/uL   Basophils Relative 1 %   Basophils Absolute 0.1 0.0 - 0.1 K/uL   Immature Granulocytes 1 %   Abs Immature Granulocytes 0.1 0.0 - 0.1 K/uL  Comprehensive metabolic panel     Status: Abnormal   Collection Time: 04/12/18  6:54 AM  Result Value Ref Range   Sodium 137 135 - 145 mmol/L   Potassium 4.1 3.5 - 5.1 mmol/L   Chloride 105 98 - 111 mmol/L   CO2 22 22 - 32 mmol/L   Glucose, Bld 92 70 - 99 mg/dL   BUN 40 (H) 8 - 23 mg/dL   Creatinine, Ser 1.41 (H) 0.61 - 1.24 mg/dL   Calcium 8.9 8.9 - 10.3 mg/dL   Total Protein 5.7 (L) 6.5 - 8.1 g/dL   Albumin 3.3 (L) 3.5 - 5.0 g/dL   AST 18 15 - 41 U/L   ALT 12 0 - 44 U/L   Alkaline Phosphatase 80 38 - 126 U/L   Total Bilirubin 1.4 (H) 0.3 - 1.2 mg/dL   GFR calc non Af Amer 44 (L) >60 mL/min   GFR calc Af Amer 52 (L) >60 mL/min   Anion gap 10 5 - 15  I-stat troponin, ED     Status: None   Collection Time: 04/12/18  7:00 AM  Result Value Ref Range   Troponin i, poc 0.00 0.00 - 0.08 ng/mL   Comment 3  I-Stat Chem 8, ED     Status: Abnormal   Collection Time: 04/12/18  7:01 AM  Result Value Ref Range   Sodium 139 135 - 145 mmol/L   Potassium 4.0 3.5 - 5.1 mmol/L   Chloride 103 98 - 111 mmol/L   BUN 40 (H) 8 - 23 mg/dL   Creatinine, Ser 1.30 (H) 0.61 - 1.24 mg/dL   Glucose, Bld 92 70 - 99 mg/dL   Calcium, Ion 1.12 (L) 1.15 - 1.40 mmol/L   TCO2 25 22 - 32 mmol/L   Hemoglobin 16.3 13.0 - 17.0 g/dL   HCT 48.0 39.0 - 52.0 %  Ethanol     Status: None   Collection Time: 04/12/18  9:58 AM  Result Value Ref Range   Alcohol, Ethyl (B) <10 <10 mg/dL  Urinalysis, Routine w reflex microscopic     Status: Abnormal   Collection Time: 04/12/18 10:05 AM  Result Value Ref Range   Color, Urine YELLOW YELLOW   APPearance CLEAR CLEAR   Specific Gravity, Urine 1.042  (H) 1.005 - 1.030   pH 7.0 5.0 - 8.0   Glucose, UA NEGATIVE NEGATIVE mg/dL   Hgb urine dipstick NEGATIVE NEGATIVE   Bilirubin Urine NEGATIVE NEGATIVE   Ketones, ur NEGATIVE NEGATIVE mg/dL   Protein, ur NEGATIVE NEGATIVE mg/dL   Nitrite NEGATIVE NEGATIVE   Leukocytes, UA NEGATIVE NEGATIVE     Imaging results:  Ct Angio Head W Or Wo Contrast  Result Date: 04/12/2018 CLINICAL DATA:  Left-sided weakness, left facial droop, and slurred speech. EXAM: CT ANGIOGRAPHY HEAD AND NECK CT PERFUSION BRAIN TECHNIQUE: Multidetector CT imaging of the head and neck was performed using the standard protocol during bolus administration of intravenous contrast. Multiplanar CT image reconstructions and MIPs were obtained to evaluate the vascular anatomy. Carotid stenosis measurements (when applicable) are obtained utilizing NASCET criteria, using the distal internal carotid diameter as the denominator. Multiphase CT imaging of the brain was performed following IV bolus contrast injection. Subsequent parametric perfusion maps were calculated using RAPID software. CONTRAST:  48mL ISOVUE-370 IOPAMIDOL (ISOVUE-370) INJECTION 76% COMPARISON:  Noncontrast head CT earlier today. Brain MRI 01/18/2014. No prior angiographic imaging. FINDINGS: CTA NECK FINDINGS Aortic arch: Normal variant aortic arch branching pattern with the left vertebral artery arising directly from the arch. Mild arch atherosclerosis. Calcified plaque at the left subclavian artery origin results in less than 50% stenosis. Park carotid system: Patent without evidence of stenosis or dissection. Mild plaque about the carotid bifurcation. Left carotid system: Patent without evidence of stenosis or dissection. Calcified plaque about the carotid bifurcation, slightly greater than on the left. Vertebral arteries: The vertebral arteries are patent with the Park being strongly dominant. Focal calcified plaque in the proximal Park V1 segment results in mild stenosis.  The proximal left V1 segment is suboptimally evaluated due to motion artifact in the upper mediastinum and the small size of the vessel. Skeleton: Moderate to severe cervical disc and facet degeneration. Grade 1 anterolisthesis of C4 on C5. Other neck: No evidence of acute abnormality or mass. Upper chest: Mild motion artifact and subpleural opacity which may reflect atelectasis. Review of the MIP images confirms the above findings CTA HEAD FINDINGS Anterior circulation: The internal carotid arteries are widely patent from skull base to carotid termini without significant siphon atherosclerosis. The ACAs and MCAs are patent without evidence of proximal branch occlusion or significant proximal stenosis. There is some asymmetric attenuation of distal left MCA branch vessels in the temporoparietal region. No aneurysm is  identified. Posterior circulation: The intracranial vertebral arteries are patent to the basilar with the left being diminutive distal to the PICA origin. Patent PICA and SCA origins are visualized bilaterally. The basilar artery is widely patent. There are large posterior communicating arteries with diminutive or absent P1 segments bilaterally. There is mild distal Park posterior communicating/proximal P2 stenosis. No aneurysm is identified. Venous sinuses: As permitted by contrast timing, patent. Anatomic variants: Fetal origins of the PCAs. Review of the MIP images confirms the above findings CT Brain Perfusion Findings: CBF (<30%) Volume: 0 mL Perfusion (Tmax>6.0s) volume: 0 mL Mismatch Volume: n/a Infarction Location: n/a IMPRESSION: 1. No emergent large vessel occlusion. 2. No significant proximal intracranial arterial stenosis. 3. Mild cervical carotid artery atherosclerosis without stenosis. 4. Mild proximal Park vertebral artery stenosis. Hypoplastic left vertebral artery. Electronically Signed   By: Logan Bores M.D.   On: 04/12/2018 07:59   Ct Angio Neck W Or Wo Contrast  Result Date:  04/12/2018 CLINICAL DATA:  Left-sided weakness, left facial droop, and slurred speech. EXAM: CT ANGIOGRAPHY HEAD AND NECK CT PERFUSION BRAIN TECHNIQUE: Multidetector CT imaging of the head and neck was performed using the standard protocol during bolus administration of intravenous contrast. Multiplanar CT image reconstructions and MIPs were obtained to evaluate the vascular anatomy. Carotid stenosis measurements (when applicable) are obtained utilizing NASCET criteria, using the distal internal carotid diameter as the denominator. Multiphase CT imaging of the brain was performed following IV bolus contrast injection. Subsequent parametric perfusion maps were calculated using RAPID software. CONTRAST:  23mL ISOVUE-370 IOPAMIDOL (ISOVUE-370) INJECTION 76% COMPARISON:  Noncontrast head CT earlier today. Brain MRI 01/18/2014. No prior angiographic imaging. FINDINGS: CTA NECK FINDINGS Aortic arch: Normal variant aortic arch branching pattern with the left vertebral artery arising directly from the arch. Mild arch atherosclerosis. Calcified plaque at the left subclavian artery origin results in less than 50% stenosis. Park carotid system: Patent without evidence of stenosis or dissection. Mild plaque about the carotid bifurcation. Left carotid system: Patent without evidence of stenosis or dissection. Calcified plaque about the carotid bifurcation, slightly greater than on the left. Vertebral arteries: The vertebral arteries are patent with the Park being strongly dominant. Focal calcified plaque in the proximal Park V1 segment results in mild stenosis. The proximal left V1 segment is suboptimally evaluated due to motion artifact in the upper mediastinum and the small size of the vessel. Skeleton: Moderate to severe cervical disc and facet degeneration. Grade 1 anterolisthesis of C4 on C5. Other neck: No evidence of acute abnormality or mass. Upper chest: Mild motion artifact and subpleural opacity which may reflect  atelectasis. Review of the MIP images confirms the above findings CTA HEAD FINDINGS Anterior circulation: The internal carotid arteries are widely patent from skull base to carotid termini without significant siphon atherosclerosis. The ACAs and MCAs are patent without evidence of proximal branch occlusion or significant proximal stenosis. There is some asymmetric attenuation of distal left MCA branch vessels in the temporoparietal region. No aneurysm is identified. Posterior circulation: The intracranial vertebral arteries are patent to the basilar with the left being diminutive distal to the PICA origin. Patent PICA and SCA origins are visualized bilaterally. The basilar artery is widely patent. There are large posterior communicating arteries with diminutive or absent P1 segments bilaterally. There is mild distal Park posterior communicating/proximal P2 stenosis. No aneurysm is identified. Venous sinuses: As permitted by contrast timing, patent. Anatomic variants: Fetal origins of the PCAs. Review of the MIP images confirms the above findings CT  Brain Perfusion Findings: CBF (<30%) Volume: 0 mL Perfusion (Tmax>6.0s) volume: 0 mL Mismatch Volume: n/a Infarction Location: n/a IMPRESSION: 1. No emergent large vessel occlusion. 2. No significant proximal intracranial arterial stenosis. 3. Mild cervical carotid artery atherosclerosis without stenosis. 4. Mild proximal Park vertebral artery stenosis. Hypoplastic left vertebral artery. Electronically Signed   By: Logan Bores M.D.   On: 04/12/2018 07:59   Ct Cerebral Perfusion W Contrast  Result Date: 04/12/2018 CLINICAL DATA:  Left-sided weakness, left facial droop, and slurred speech. EXAM: CT ANGIOGRAPHY HEAD AND NECK CT PERFUSION BRAIN TECHNIQUE: Multidetector CT imaging of the head and neck was performed using the standard protocol during bolus administration of intravenous contrast. Multiplanar CT image reconstructions and MIPs were obtained to evaluate the  vascular anatomy. Carotid stenosis measurements (when applicable) are obtained utilizing NASCET criteria, using the distal internal carotid diameter as the denominator. Multiphase CT imaging of the brain was performed following IV bolus contrast injection. Subsequent parametric perfusion maps were calculated using RAPID software. CONTRAST:  37mL ISOVUE-370 IOPAMIDOL (ISOVUE-370) INJECTION 76% COMPARISON:  Noncontrast head CT earlier today. Brain MRI 01/18/2014. No prior angiographic imaging. FINDINGS: CTA NECK FINDINGS Aortic arch: Normal variant aortic arch branching pattern with the left vertebral artery arising directly from the arch. Mild arch atherosclerosis. Calcified plaque at the left subclavian artery origin results in less than 50% stenosis. Park carotid system: Patent without evidence of stenosis or dissection. Mild plaque about the carotid bifurcation. Left carotid system: Patent without evidence of stenosis or dissection. Calcified plaque about the carotid bifurcation, slightly greater than on the left. Vertebral arteries: The vertebral arteries are patent with the Park being strongly dominant. Focal calcified plaque in the proximal Park V1 segment results in mild stenosis. The proximal left V1 segment is suboptimally evaluated due to motion artifact in the upper mediastinum and the small size of the vessel. Skeleton: Moderate to severe cervical disc and facet degeneration. Grade 1 anterolisthesis of C4 on C5. Other neck: No evidence of acute abnormality or mass. Upper chest: Mild motion artifact and subpleural opacity which may reflect atelectasis. Review of the MIP images confirms the above findings CTA HEAD FINDINGS Anterior circulation: The internal carotid arteries are widely patent from skull base to carotid termini without significant siphon atherosclerosis. The ACAs and MCAs are patent without evidence of proximal branch occlusion or significant proximal stenosis. There is some asymmetric  attenuation of distal left MCA branch vessels in the temporoparietal region. No aneurysm is identified. Posterior circulation: The intracranial vertebral arteries are patent to the basilar with the left being diminutive distal to the PICA origin. Patent PICA and SCA origins are visualized bilaterally. The basilar artery is widely patent. There are large posterior communicating arteries with diminutive or absent P1 segments bilaterally. There is mild distal Park posterior communicating/proximal P2 stenosis. No aneurysm is identified. Venous sinuses: As permitted by contrast timing, patent. Anatomic variants: Fetal origins of the PCAs. Review of the MIP images confirms the above findings CT Brain Perfusion Findings: CBF (<30%) Volume: 0 mL Perfusion (Tmax>6.0s) volume: 0 mL Mismatch Volume: n/a Infarction Location: n/a IMPRESSION: 1. No emergent large vessel occlusion. 2. No significant proximal intracranial arterial stenosis. 3. Mild cervical carotid artery atherosclerosis without stenosis. 4. Mild proximal Park vertebral artery stenosis. Hypoplastic left vertebral artery. Electronically Signed   By: Logan Bores M.D.   On: 04/12/2018 07:59   Ct Head Code Stroke Wo Contrast  Result Date: 04/12/2018 CLINICAL DATA:  Code stroke. Left facial droop, left-sided weakness, and  slurred speech. EXAM: CT HEAD WITHOUT CONTRAST TECHNIQUE: Contiguous axial images were obtained from the base of the skull through the vertex without intravenous contrast. COMPARISON:  02/13/2018 FINDINGS: Brain: There is no evidence of acute infarct, intracranial hemorrhage, mass, midline shift, or extra-axial fluid collection. The ventricles and sulci are normal. Subtle cerebral white matter hypodensities are unchanged and not greater than expected for age. Vascular: No hyperdense vessel. Skull: No fracture or focal osseous lesion. Sinuses/Orbits: Chronic bilateral ethmoid sinusitis most notable posteriorly. Small mucous retention cysts in the  maxillary sinuses. Clear mastoid air cells. Bilateral cataract extraction. Other: None. ASPECTS [facility] Stroke Program Early CT Score) - Ganglionic level infarction (caudate, lentiform nuclei, internal capsule, insula, M1-M3 cortex): 7 - Supraganglionic infarction (M4-M6 cortex): 3 Total score (0-10 with 10 being normal): 10 IMPRESSION: 1. Unremarkable CT appearance of the brain for age. 2. ASPECTS is 10. These results were communicated to Dr. Rory Percy at 7:11 am on 04/12/2018 by text page via the [facility] messaging system. Electronically Signed   By: Logan Bores M.D.   On: 04/12/2018 07:12    Other results: EKG: normal EKG, normal sinus rhythm, unchanged from previous tracings, normal sinus rhythm.  Assessment & Plan by Problem: Duane. Kimmel is an 84 y/o male with a medical history relevant for HTN and HLD who presented to the ER with approximately 15 hours of left sided weakness, facial droop, and dysarthria. His clinical picture and exam findings are strongly suggestive of an CVA.   # Weakness  Dysarthria  Dysmetria  Clinical picture is strongly suggestive of a CVA. Patient presented outside of the window for TPA. CT unremarkable, MRI pending. Differential diagnosis includes brain tumor and seizure.  - CT head, unremarkable  - CTA neck, unrevealing of cause  - MRI pending  - Asprin 81mg  qd  - Atorvastatin 80mg  qd - PT/OT consult  - Neurology consult (following)  - Permissive hypertension until 8/5, ensure goal Bps thereafter    Chronic Problems # GERD - Home omeprazole, Pepcid prn # Osteoarthritis (back and knees) - holding home curamin # Chronic Cough  Asthma  Unclear etiology. Ongoing issue since January of 2019. Patient concerned that previously prescribed inhalers have caused geographical tongue. 4x visits to urgent care in 8 months for similar symptoms. F/u scheduled with pulm for PFTs AUG 27th.  - hold ipatroprium/albuterol, Dulera, montelukast in setting of apparent adverse reaction  and no symptoms - continue to monitor    This is a Careers information officer Note.  The care of the patient was discussed with Dr. Sherry Ruffing and the assessment and plan was formulated with their assistance.  Please see their note for official documentation of the patient encounter.   Signed: Clearnce Hasten, Medical Student 04/12/2018, 8:33 AM

## 2018-04-12 NOTE — ED Triage Notes (Signed)
Pt arrived by [facility]; pt laid down around 2300 feeling normal, woke up at 2330 with L sided weakness. On EMS arrival, pt noted to have L sided facial droop, L sided arm and leg drift.

## 2018-04-12 NOTE — ED Notes (Signed)
Pt family asks from lobby whether they can be sent back to pt room, updated that pt is still in imaging, tech aware that pt family is arrived at ED lobby

## 2018-04-12 NOTE — Evaluation (Signed)
Clinical/Bedside Swallow Evaluation Patient Details  Name: Duane Park MRN: [id number] Date of Birth: [birth year]  Today's Date: 04/12/2018 Time: SLP Start Time (ACUTE ONLY): 19 SLP Stop Time (ACUTE ONLY): 1408 SLP Time Calculation (min) (ACUTE ONLY): 10 min  Past Medical History:  Past Medical History:  Diagnosis Date  . COPD (chronic obstructive pulmonary disease) ([place])   . Hypercholesteremia   . Hypertension    Past Surgical History:  Past Surgical History:  Procedure Laterality Date  . BACK SURGERY     HPI:  84 year old male with past medical history of hypertension, hyperlipidemia, and GERD presenting with left sided weakness.  Pt undergoing stroke work up.  CT 8/3 unremarkable. MRI Pending.  Pt reports coughing off and on since January wiht visits to walk-in clinic, but denies hx of pneumonia   Assessment / Plan / Recommendation Clinical Impression  Pt presents with functional swallowing as assessed clinically.  Pt tolerated all consistencies trialed with no clinical s/s of aspiration and exhibited good oral clearance of solids.  Pt reported getting choked while eating just before SLP arrival, but no clinical indicators of pharyngeal dysphagia were observed with po trials.  Pt had intermittently hoarse vocal quality and reports hx of recurrent cough which has sent him to walk in clinics, but no pneumonia.   Recommend regular diet with thin liquid.    Pt denies changes to speech, language, and cognition.  Pt was able to participate in conversation without apparent difficulty.  [name] speech is clear and free from dysarthria.  Wife endorses that pt has not had any acute changes to cognitive linguistic abilities. SLP Visit Diagnosis: Dysphagia, unspecified (R13.10)    Aspiration Risk  No limitations    Diet Recommendation Regular;Thin liquid   Liquid Administration via: Cup;Straw Medication Administration: Whole meds with liquid Supervision: Patient able to self  feed Compensations: Slow rate;Small sips/bites Postural Changes: Seated upright at 90 degrees    Follow up Recommendations None        Swallow Study   General HPI: 84 year old male with past medical history of hypertension, hyperlipidemia, and GERD presenting with left sided weakness.  Pt undergoing stroke work up.  CT 8/3 unremarkable. MRI Pending.  Pt reports coughing off and on since January wiht visits to walk-in clinic, but denies hx of pneumonia Type of Study: Bedside Swallow Evaluation Diet Prior to this Study: Regular Temperature Spikes Noted: No History of Recent Intubation: No Behavior/Cognition: Alert;Cooperative;Pleasant mood Oral Cavity Assessment: Within Functional Limits Oral Care Completed by SLP: (Pt brushing teeth at bedside on SLP arrival) Oral Cavity - Dentition: Adequate natural dentition Self-Feeding Abilities: Able to feed self Patient Positioning: Upright in bed(edge of bed) Baseline Vocal Quality: Hoarse Volitional Cough: Strong Volitional Swallow: Able to elicit    Oral/Motor/Sensory Function Overall Oral Motor/Sensory Function: Mild impairment Facial ROM: Reduced left Facial Symmetry: Abnormal symmetry left Lingual ROM: Within Functional Limits Lingual Symmetry: Within Functional Limits Lingual Strength: Within Functional Limits Velum: Within Functional Limits   Ice Chips Ice chips: Not tested   Thin Liquid Thin Liquid: Within functional limits Presentation: Cup;Straw    Nectar Thick Nectar Thick Liquid: Not tested   Honey Thick Honey Thick Liquid: Not tested   Puree Puree: Within functional limits Presentation: Spoon   Solid     Solid: Within functional limits Presentation: Vaughn, [place], [place] 04/12/2018,2:21 PM

## 2018-04-12 NOTE — H&P (Addendum)
Date: 04/12/2018               Patient Name:  Duane Park MRN: [id number]  DOB: April 08, 1932 Age / Sex: 84 y.o., male   PCP: Greig Right, MD         Medical Service: Internal Medicine Teaching Service         Attending Physician: Dr. Oval Linsey, MD    First Contact: Dr. Sherry Ruffing Pager: [phone]  Second Contact: Dr. Hetty Ely Pager: [phone]       After Hours (After 5p/  First Contact Pager: [phone]  weekends / holidays): Second Contact Pager: [phone]   Chief Complaint: Weakness  History of Present Illness: This is an 84 year old male with past medical history of hypertension, hyperlipidemia, and GERD presenting with left sided weakness. He started stumbling around 3 PM yesterday, and at 11:30 last night her noticed that he became very weak in his left arm and left leg, and felt generally unwell.  He went to bed and when he woke up he was still having these symptoms so he decided to call EMS. They evaluated the patient and broguht him in for a code stroke. He denies any fevers, chills, chest pain, SOB,. dizziness, headaches, or tingling.  He has never had this before. His blood pressure is well controlled, SBP in the 120-130s, and he checks it a couple of times per day.   For the past couple of months he has been having these episodes of congestion, chest tightness, rhinorrhea, and dry cough. It would last for about 4-5 days and then he would go to the acute care clinic where they would give him breathing treatments and antibiotics. He has had this 4 times in the last couple of months. He denies any symptoms at the moment. He was recently given dulera from the pulmonologist and has a follow up appointment and PFT planned.   In the ED patient was found to be afebrile, RR 17, HR 67, BP 141/68.  Labs are significant for BUN 40 creatinine 1.3, unknown baseline.  CBC was unremarkable.  CT head showed no acute intracranial abnormalities.  CTA head/neck showed aortic arch calcification, mild  calcification and bifurcations of the carotids with no significant stenosis, and patetn posterior circulation with left fetal PCA and possible right PCA. CT perfusion showed no deficits. EKG showed normal sinus rhythm with first-degree heart block. Neurology evaluated and think this is likely an acute ischemic stroke that is likely small vessel etiology. He was given 325 ASA. Patient was admitted to internal medicine.     Meds:  No current facility-administered medications on file prior to encounter.    Current Outpatient Medications on File Prior to Encounter  Medication Sig Dispense Refill  . albuterol (PROAIR HFA) 108 (90 Base) MCG/ACT inhaler Inhale 2 puffs into the lungs every 6 (six) hours as needed for wheezing or shortness of breath.    . Carboxymethylcellulose Sodium (THERATEARS) 0.25 % SOLN Apply 1 drop to eye as needed (dry eyes).    . diphenhydrAMINE-Phenylephrine (DELSYM NIGHT TIME COUGH/COLD) 6.25-2.5 MG/5ML LIQD Take 5 mLs by mouth as needed (cough).    . famotidine (PEPCID) 20 MG tablet Take 20 mg by mouth daily.    . fluticasone (FLONASE) 50 MCG/ACT nasal spray Place 2 sprays into both nostrils daily at 12 noon.  2  . ipratropium-albuterol (DUONEB) 0.5-2.5 (3) MG/3ML SOLN Take 3 mLs by nebulization every 4 (four) hours as needed.    . Liniments (BLUE-EMU SUPER STRENGTH  EX) Apply 1 application topically as needed (shoulder pain).    . mometasone-formoterol (DULERA) 100-5 MCG/ACT AERO Inhale 2 puffs into the lungs 2 (two) times daily. 1 Inhaler 11  . montelukast (SINGULAIR) 10 MG tablet Take 10 mg by mouth at bedtime.    Marland Kitchen olmesartan-hydrochlorothiazide (BENICAR HCT) 40-25 MG tablet Take 1 tablet by mouth daily.    Marland Kitchen omeprazole (PRILOSEC) 40 MG capsule Take 40 mg by mouth daily.    Marland Kitchen Phytosterol Esters (CHOLEST CARE) 500 MG CAPS Take 1 Can by mouth daily.    . Psyllium (EQ DAILY FIBER PO) Take 1 capsule by mouth daily.    Marland Kitchen UNABLE TO FIND Med Name: Curamin OTC daily    .  VIRTUSSIN A/C 100-10 MG/5ML syrup Take 5 mLs by mouth 4 (four) times daily as needed for cough.  0    Current Meds  Medication Sig  . albuterol (PROAIR HFA) 108 (90 Base) MCG/ACT inhaler Inhale 2 puffs into the lungs every 6 (six) hours as needed for wheezing or shortness of breath.  . Carboxymethylcellulose Sodium (THERATEARS) 0.25 % SOLN Apply 1 drop to eye as needed (dry eyes).  . diphenhydrAMINE-Phenylephrine (DELSYM NIGHT TIME COUGH/COLD) 6.25-2.5 MG/5ML LIQD Take 5 mLs by mouth as needed (cough).  . famotidine (PEPCID) 20 MG tablet Take 20 mg by mouth daily.  . fluticasone (FLONASE) 50 MCG/ACT nasal spray Place 2 sprays into both nostrils daily at 12 noon.  Marland Kitchen ipratropium-albuterol (DUONEB) 0.5-2.5 (3) MG/3ML SOLN Take 3 mLs by nebulization every 4 (four) hours as needed.  . Liniments (BLUE-EMU SUPER STRENGTH EX) Apply 1 application topically as needed (shoulder pain).  . mometasone-formoterol (DULERA) 100-5 MCG/ACT AERO Inhale 2 puffs into the lungs 2 (two) times daily.  . montelukast (SINGULAIR) 10 MG tablet Take 10 mg by mouth at bedtime.  Marland Kitchen olmesartan-hydrochlorothiazide (BENICAR HCT) 40-25 MG tablet Take 1 tablet by mouth daily.  Marland Kitchen omeprazole (PRILOSEC) 40 MG capsule Take 40 mg by mouth daily.  Marland Kitchen Phytosterol Esters (CHOLEST CARE) 500 MG CAPS Take 1 Can by mouth daily.  . Psyllium (EQ DAILY FIBER PO) Take 1 capsule by mouth daily.  Marland Kitchen UNABLE TO FIND Med Name: Curamin OTC daily  . VIRTUSSIN A/C 100-10 MG/5ML syrup Take 5 mLs by mouth 4 (four) times daily as needed for cough.     Allergies: Allergies as of 04/12/2018  . (No Known Allergies)   Past Medical History:  Diagnosis Date  . COPD (chronic obstructive pulmonary disease) ([place])   . Hypercholesteremia   . Hypertension     Family History: Heart disease in mother and father, patient does not recall what type. No history of strokes.   Social History: Denies smoking, has not had any EtOH for 30 years. Eats out a lot and does  not exercise.   Review of Systems: A complete ROS was negative except as per HPI.   Physical Exam: Blood pressure 137/79, pulse 67, temperature 98.1 F (36.7 C), temperature source Oral, resp. rate 18, SpO2 96 %. General: alert, cooperative, appears stated age and mild distress HEENT: PERRLA, extra ocular movement intact and neck supple with midline trachea, geographic tongue Heart: S1, S2 normal, no murmur, rub or gallop, regular rate and rhythm Lungs: clear to auscultation, no wheezes or rales and unlabored breathing Abdomen: abdomen is soft without significant tenderness, masses, organomegaly or guarding Extremities: extremities normal, atraumatic, no cyanosis or edema Skin:no rashes, no petechiae Neurology: Alert and oriented x3, mental status, speech normal, alert and oriented x3,  PERLA and reflexes normal and symmetric Motor: 4/5 left upper extremity, 4/5 L lower extremity, 5/5 in right extremities. Sensation: Numbness in left pointer finger, normal sensation throughout.  Mild dysmetria on left finger-to-nose test. Psychiatry: Normal mood and affect   EKG: personally reviewed my interpretation is rate 77 bpm, normal sinus rhythm, 1st degree heart block, borderline QT prolongation   Assessment & Plan by Problem: This is an 84 year old male with past medical history of hypertension, hyperlipidemia, and GERD presenting with left sided weakness. Admitted for a code stroke.   CVA: Patient presented for left sided weakness that started at 11 pm last the night prior to admission.  He reports that he started not feeling very well around 3 PM and then at 11 PM he noticed some weakness in his left upper and lower extremities.  He woke up in the morning and was still having the symptoms EMS was called.  He was found to be hypertensive on admission with left facial droop, 4/5 left upper and lower extremity strength, mild dysmetria on finger-to-nose test.  Initial CT scan showed no acute change with  aspects 10.  EKG showed normal sinus rhythm.  CTA head and neck showed no emergent large vessel occlusion, no significant arterial stenosis, mild cervical carotid artery atherosclerosis, mild proximal right vertebral artery stenosis and hypoplastic left vertebral artery.  Neurology evaluated, no TPA given due to him being outside the window.  They suspect pure motor lacunar stroke.  Aspirin and atorvastatin was started.  -- Neurology following -- Allow for permissive HTN for 48 hours SBP < 720 and diastolic < 947  -- ASA 096 mg, 81 mg daily -- Atorvastatin 80 mg daily -- Q4 neuro checks  -- Echocardiogram  -- A1C  -- Lipid panel  -- Tele monitoring  -- PT/OT/ Speech evaluation  ?COPD/Asthma: Patient has been having the episodes of congestion, cough, wheezing, chest tightness that improve after breathing treatments.  Patient is seeing a pulmonologist and was recently prescribed [facility] however reported that he hasn't been using it since he hasn't been having symptoms.  He has a follow-up with his pulmonologist they plan to do a PFT in the future.  GERD: Patient is on omeprazole at home.  -Continue home omeprazole.   HTN: Patient was hypertensive on admission, up to 163/72. Is on Benicar HCT at home.  -Hold benicar -Allowing for permissive HTN for 48 hours.   FEN: No fluids , replete lytes prn, regular diet  VTE ppx: Lovenox  Code Status: DNR    Dispo: Admit patient to Inpatient with expected length of stay greater than 2 midnights.  Signed: Asencion Noble, MD 04/12/2018, 11:32 AM  Pager: [phone]

## 2018-04-12 NOTE — ED Provider Notes (Addendum)
[facility] EMERGENCY DEPARTMENT Provider Note   CSN: [id number] Arrival date & time: 04/12/18  3329   An emergency department physician performed an initial assessment on this suspected stroke patient at [phone].  History   Chief Complaint Chief Complaint  Patient presents with  . Code Stroke    HPI Duane Park is a 84 y.o. male. Level 5 caveat HPI 84 year old man history of COPD, hypertension presents today with left-sided weakness.  Last known normal 11:30 PM last night.  He states that he did have some stumbling that began around 3 PM yesterday.  He states that he "knew he was in trouble" at 1130 last night when he began experiencing weakness in his left arm, left leg, and left facial droop.  He denies any visual changes or difficulty speaking.  He presented initially as code stroke and was screened by Dr. Christy Gentles and seen by Dr. Lorraine Lax.  He was taken directly to the CT scanner.  Patient is unable to give me history.  My history was obtained from patient and Dr. Lorraine Lax. Past Medical History:  Diagnosis Date  . COPD (chronic obstructive pulmonary disease) ([place])   . Hypercholesteremia   . Hypertension     Patient Active Problem List   Diagnosis Date Noted  . Chronic cough 03/28/2018  . OSTEOARTHRITIS, BACK 09/13/2009  . ARTHRITIS, KNEES, BILATERAL 09/13/2009  . 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 ABNORMALITY OF GAIT 09/13/2009    Past Surgical History:  Procedure Laterality Date  . BACK SURGERY          Home Medications    Prior to Admission medications   Medication Sig Start Date End Date Taking? Authorizing Provider  albuterol (PROAIR HFA) 108 (90 Base) MCG/ACT inhaler Inhale 2 puffs into the lungs every 6 (six) hours as needed for wheezing or shortness of breath.    [provider]  ipratropium-albuterol (DUONEB) 0.5-2.5 (3) MG/3ML SOLN Take 3 mLs by nebulization every 4 (four) hours as needed.    [provider]    mometasone-formoterol (DULERA) 100-5 MCG/ACT AERO Inhale 2 puffs into the lungs 2 (two) times daily. 04/01/18   Tanda Rockers, MD  montelukast (SINGULAIR) 10 MG tablet Take 10 mg by mouth at bedtime.    [provider]  olmesartan-hydrochlorothiazide (BENICAR HCT) 40-25 MG tablet Take 1 tablet by mouth daily.    [provider]  omeprazole (PRILOSEC) 40 MG capsule Take 40 mg by mouth daily.    [provider]  Phytosterol Esters (CHOLEST CARE) 500 MG CAPS Take 1 Can by mouth daily.    [provider]  Psyllium (EQ DAILY FIBER PO) Take 1 capsule by mouth daily.    [provider]  UNABLE TO FIND Med Name: Curamin OTC daily    [provider]    Family History Family History  Problem Relation Age of Onset  . Lung disease Mother        never smoker  . Heart disease Father     Social History Social History   Tobacco Use  . Smoking status: Never Smoker  . Smokeless tobacco: Never Used  Substance Use Topics  . Alcohol use: Not Currently    Frequency: Never  . Drug use: Never     Allergies   Patient has no known allergies.   Review of Systems Review of Systems  Musculoskeletal: Positive for back pain and gait problem.  All other systems reviewed and are negative.  Physical Exam Updated Vital Signs BP (!) 141/68   Pulse 64   Temp 98.1 F (36.7 C) (Oral)   Resp 13   SpO2 98%   Physical Exam  Constitutional: He is oriented to person, place, and time. He appears well-developed and well-nourished. No distress.  HENT:  Head: Normocephalic and atraumatic.  Right Ear: External ear normal.  Left Ear: External ear normal.  Nose: Nose normal.  Mouth/Throat: Oropharynx is clear and moist.  Eyes: Pupils are equal, round, and reactive to light. Conjunctivae and EOM are normal.  Neck: Normal range of motion. Neck supple.  Cardiovascular: Normal rate, regular rhythm, normal heart sounds and intact distal pulses.   Pulmonary/Chest: Effort normal and breath sounds normal.  Abdominal: Soft. Bowel sounds are normal.  Musculoskeletal: Normal range of motion.  Neurological: He is alert and oriented to person, place, and time. He displays normal reflexes. A cranial nerve deficit is present. Coordination normal.  Patient with left-sided facial droop at rest but able to move left side of face on command He has some left palmar drift Left leg is somewhat weaker than right leg but he is able to lift against gravity  Skin: Skin is warm and dry. Capillary refill takes less than 2 seconds.  Psychiatric: He has a normal mood and affect. His behavior is normal. Thought content normal.  Nursing note and vitals reviewed.    ED Treatments / Results  Labs (all labs ordered are listed, but only abnormal results are displayed) Labs Reviewed  CBC - Abnormal; Notable for the following components:      Result Value   MCV 103.6 (*)    MCH 34.7 (*)    All other components within normal limits  DIFFERENTIAL - Abnormal; Notable for the following components:   Monocytes Absolute 1.1 (*)    All other components within normal limits  I-STAT CHEM 8, ED - Abnormal; Notable for the following components:   BUN 40 (*)    Creatinine, Ser 1.30 (*)    Calcium, Ion 1.12 (*)    All other components within normal limits  PROTIME-INR  APTT  ETHANOL  COMPREHENSIVE METABOLIC PANEL  RAPID URINE DRUG SCREEN, HOSP PERFORMED  URINALYSIS, ROUTINE W REFLEX MICROSCOPIC  I-STAT TROPONIN, ED    EKG EKG Interpretation  Date/Time:  Saturday April 12 2018 07:29:01 EDT Ventricular Rate:  77 PR Interval:    QRS Duration: 105 QT Interval:  389 QTC Calculation: 441 R Axis:   -53 Text Interpretation:  Normal sinus rhythm Left anterior fasicular block No significant change since last tracing Confirmed by Pattricia Boss [phone]) on 04/12/2018 7:32:49 AM   Radiology Ct Head Code Stroke Wo Contrast  Result Date: 04/12/2018 CLINICAL DATA:   Code stroke. Left facial droop, left-sided weakness, and slurred speech. EXAM: CT HEAD WITHOUT CONTRAST TECHNIQUE: Contiguous axial images were obtained from the base of the skull through the vertex without intravenous contrast. COMPARISON:  02/13/2018 FINDINGS: Brain: There is no evidence of acute infarct, intracranial hemorrhage, mass, midline shift, or extra-axial fluid collection. The ventricles and sulci are normal. Subtle cerebral white matter hypodensities are unchanged and not greater than expected for age. Vascular: No hyperdense vessel. Skull: No fracture or focal osseous lesion. Sinuses/Orbits: Chronic bilateral ethmoid sinusitis most notable posteriorly. Small mucous retention cysts in the maxillary sinuses. Clear mastoid air cells. Bilateral cataract extraction. Other: None. ASPECTS [facility] Stroke Program Early CT Score) - Ganglionic level infarction (caudate, lentiform nuclei, internal capsule, insula, M1-M3 cortex): 7 - Supraganglionic infarction (  M4-M6 cortex): 3 Total score (0-10 with 10 being normal): 10 IMPRESSION: 1. Unremarkable CT appearance of the brain for age. 2. ASPECTS is 10. These results were communicated to Dr. Rory Percy at 7:11 am on 04/12/2018 by text page via the [facility] messaging system. Electronically Signed   By: Logan Bores M.D.   On: 04/12/2018 07:12    Procedures Procedures (including critical care time)  Medications Ordered in ED Medications  iopamidol (ISOVUE-370) 76 % injection (90 mLs  Contrast Given 04/12/18 0708)  aspirin EC tablet 325 mg (325 mg Oral Given 04/12/18 0743)     Initial Impression / Assessment and Plan / ED Course  I have reviewed the triage vital signs and the nursing notes.  Pertinent labs & imaging results that were available during my care of the patient were reviewed by me and considered in my medical decision making (see chart for details).    Patient presented with new onset weakness but is outside the 4.5-hour window for IV TPA.  No symptoms  noted of large vessel occlusion-specifically no visual changes, aphasia, or neglect  Patient seen in collaboration with Dr. Malen Gauze.  Advises admit to medicine service.  Patient to be started on 325 mg of aspirin which is ordered here in the ED. Plan MRI, echo, hemoglobin A1c. Patient's primary care physician is at [facility]. Patient to be admitted to IM TSB and will see in ED Final Clinical Impressions(s) / ED Diagnoses   Final diagnoses:  Cerebrovascular accident (CVA), unspecified mechanism [facility])    ED Discharge Orders    None       Pattricia Boss, MD 04/12/18 1537    Pattricia Boss, MD 04/27/18 [phone]

## 2018-04-12 NOTE — Discharge Summary (Signed)
Name: Duane Park MRN: [id number] DOB: Jan 22, 1932 84 y.o. PCP: Greig Right, MD  Date of Admission: 04/12/2018  6:52 AM Date of Discharge: 04/15/18 Attending Physician: Oval Linsey, MD  Discharge Diagnosis: 1. 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 Start taking on:  04/15/2018   atorvastatin 80 MG tablet Commonly known as:  LIPITOR Take 1 tablet (80 mg total) by mouth daily at 6 PM.   BLUE-EMU SUPER STRENGTH EX Apply 1 application topically as needed (shoulder pain).   DELSYM NIGHT TIME COUGH/COLD 6.25-2.5 MG/5ML Liqd Generic drug:  diphenhydrAMINE-Phenylephrine Take 5 mLs by mouth as needed (cough).   EQ DAILY FIBER PO Take 1 capsule by mouth daily.   fluticasone 50 MCG/ACT nasal spray Commonly known as:  FLONASE Place 2 sprays into both nostrils daily at 12 noon.   ipratropium-albuterol 0.5-2.5 (3) MG/3ML Soln Commonly known as:  DUONEB Take 3 mLs by nebulization every 4 (four) hours as needed.   mometasone-formoterol 100-5 MCG/ACT Aero Commonly known as:  DULERA Inhale 2 puffs into the lungs 2 (two) times daily.   montelukast 10 MG tablet Commonly known as:  SINGULAIR Take 10 mg by mouth at bedtime.   pantoprazole 40 MG tablet Commonly known as:  PROTONIX Take 2 tablets (80 mg total) by mouth daily. Start taking on:  04/15/2018 Replaces:  omeprazole 40 MG capsule   PROAIR HFA 108 (90 Base) MCG/ACT inhaler Generic drug:  albuterol Inhale 2 puffs into the lungs every 6 (six)  hours as needed for wheezing or shortness of breath.   THERATEARS 0.25 % Soln Generic drug:  Carboxymethylcellulose Sodium Apply 1 drop to eye as needed (dry eyes).   UNABLE TO FIND Med Name: Curamin OTC daily   VIRTUSSIN A/C 100-10 MG/5ML syrup Generic drug:  guaiFENesin-codeine Take 5 mLs by mouth 4 (four) times daily as needed for cough.       Disposition and follow-up:   Mr.Duane Park was discharged from [facility] in Stable condition.  At the hospital follow up visit please address:  1. Patients recovery of his left sided weakness.  He was started on atorvastatin so assess for any new muscle pains. He was started on high dose protonix for possible GERD related cough so please assess for cough.   2.  Labs / imaging needed at time of follow-up: None  3.  Pending labs/ test needing follow-up: None  Follow-up Appointments: Follow-up Information    Francesca Oman, DO Follow up.   Specialty:  Internal Medicine Contact information: [address] [place]  [address] [place] Hospital Course by problem list: 1.  CVA: This is an 84 year old male with a PMH of HTN, HLD, and GERD. Admitted with left sided facial droop, slurred speech, and left upper and lower extremity weakness. On exam he had 4/5 strength in left upper and lower extremity strength,  mild dysmetria on finger to nose test. CT showed no acute intracranial abnormalities. CTA head and neck showed no large vessel occlusion, no significant proximal intracranial arterial stenosis, mild cervical carotid artery atherosclerosis without stenosis, and mild right vertebral artery stenosis and hypoplastic left vertebral artery. Neurology evaluated and patient was outside of the time window for tPA. He was started on ASA and atorvastatin. Hgb A1c was 5.3. Lipids: Cholesterol: 188, HDL 20, LDL 126, triglycerides 160. Echocardiogram showed an EF 55-60% with grade 1 diastolic  dysfunction, normal wall motion, with mild calcified aortic leaflets. Stroke team evaluated and recommended inpatient rehab.   History of chronic cough: Patient has been having episodes of dry cough, congestion, and  And rhinorrhea that last for 4-5 days and improved with breathing treatments and antibiotis. Happened 4 times in the last month. He has met with the pulmonologist who prescribed dulera, they planned a PFT in a few weeks. He did not have any symptoms while in the hospital but was very concerned about these issues. Given his history of GERD and the relation that this can have with a chronic cough we increased his omeprazole to 80 mg daily.   Essential HTN: Patient is on benicar at home. Allowed permissive hypertension for first 48 hours. Remained normotensive until day of discharge. Resumed home medication.   GERD: Increased omeprazole dose.   Discharge Vitals:   BP 132/65 (BP Location: Right Arm)   Pulse 75   Temp 98.2 F (36.8 C) (Oral)   Resp 20   Ht 5' 11"  (1.803 m)   Wt 205 lb 7.5 oz (93.2 kg)   SpO2 98%   BMI 28.66 kg/m   Pertinent Labs, Studies, and Procedures:  CT code stroke: IMPRESSION: 1. Unremarkable CT appearance of the brain for age. 2. ASPECTS is 10.  CTA head/neck: IMPRESSION: 1. No emergent large vessel occlusion. 2. No significant proximal intracranial arterial stenosis. 3. Mild cervical carotid artery atherosclerosis without stenosis. 4. Mild proximal right vertebral artery stenosis. Hypoplastic left vertebral artery.  MRI:  IMPRESSION: 1. 6 mm acute/early subacute infarction within the right central cerebral peduncle. No associated hemorrhage or mass effect. 2. Mild volume loss of the brain for age. 3. Mild ethmoid and maxillary sinus disease.  These results will be called to the ordering clinician or representative by the Radiologist Assistant, and communication documented in the PACS or zVision Dashboard.  Discharge  Instructions: Discharge Instructions    Call MD for:  difficulty breathing, headache or visual disturbances   Complete by:  As directed    Call MD for:  extreme fatigue   Complete by:  As directed    Call MD for:  persistant dizziness or light-headedness   Complete by:  As directed    Diet - low sodium heart healthy   Complete by:  As directed    Discharge instructions   Complete by:  As directed    Ronna Polio,   It has been a pleasure working with you and we are glad you're feeling better. You were hospitalized for a stroke. Please take note of the new medications you were started on to reduce your risk of having another stroke.    For your high cholesterol, START taking atorvastatin  To keep your blood thin, start taking aspirin 81 mg daily  For your cough, start taking protonix 80 mg daily. You will have to stay on this medication at this dose for three months straight to know for sure if it may be  heartburn causing your cough.  We found that your blood pressure was well controlled while you were in the hospital, you can consider holding off on your blood pressure medication until you have followed up with your doctor.   Follow up with Dr. Redmond Pulling in 1-2 weeks, we have included her information in your chart.   If your symptoms worsen or you develop new symptoms, please seek medical help whether it is your primary care provider or emergency department.  If you have any questions about this hospitalization please call [phone].   Increase activity slowly   Complete by:  As directed       Signed: Ledell Noss, MD 04/14/2018, 3:34 PM   Pager: [phone]

## 2018-04-13 ENCOUNTER — Inpatient Hospital Stay (HOSPITAL_COMMUNITY): Payer: Medicare Other

## 2018-04-13 DIAGNOSIS — G8194 Hemiplegia, unspecified affecting left nondominant side: Secondary | ICD-10-CM

## 2018-04-13 DIAGNOSIS — I10 Essential (primary) hypertension: Secondary | ICD-10-CM

## 2018-04-13 DIAGNOSIS — E785 Hyperlipidemia, unspecified: Secondary | ICD-10-CM

## 2018-04-13 DIAGNOSIS — K219 Gastro-esophageal reflux disease without esophagitis: Secondary | ICD-10-CM

## 2018-04-13 DIAGNOSIS — I700 Atherosclerosis of aorta: Secondary | ICD-10-CM

## 2018-04-13 DIAGNOSIS — R05 Cough: Secondary | ICD-10-CM

## 2018-04-13 DIAGNOSIS — Z79899 Other long term (current) drug therapy: Secondary | ICD-10-CM

## 2018-04-13 DIAGNOSIS — I639 Cerebral infarction, unspecified: Secondary | ICD-10-CM

## 2018-04-13 DIAGNOSIS — I6330 Cerebral infarction due to thrombosis of unspecified cerebral artery: Secondary | ICD-10-CM

## 2018-04-13 DIAGNOSIS — R0982 Postnasal drip: Secondary | ICD-10-CM

## 2018-04-13 DIAGNOSIS — I440 Atrioventricular block, first degree: Secondary | ICD-10-CM

## 2018-04-13 DIAGNOSIS — I6359 Cerebral infarction due to unspecified occlusion or stenosis of other cerebral artery: Secondary | ICD-10-CM

## 2018-04-13 LAB — LIPID PANEL
Cholesterol: 188 mg/dL (ref 0–200)
HDL: 30 mg/dL — ABNORMAL LOW (ref >40)
LDL Cholesterol: 126 mg/dL — ABNORMAL HIGH (ref 0–99)
Total CHOL/HDL Ratio: 6.3 RATIO
Triglycerides: 160 mg/dL — ABNORMAL HIGH (ref <150)
VLDL: 32 mg/dL (ref 0–40)

## 2018-04-13 LAB — BASIC METABOLIC PANEL
Anion gap: 9 (ref 5–15)
BUN: 23 mg/dL (ref 8–23)
CO2: 27 mmol/L (ref 22–32)
Calcium: 9.2 mg/dL (ref 8.9–10.3)
Chloride: 104 mmol/L (ref 98–111)
Creatinine, Ser: 1.27 mg/dL — ABNORMAL HIGH (ref 0.61–1.24)
GFR calc Af Amer: 59 mL/min — ABNORMAL LOW (ref >60)
GFR calc non Af Amer: 50 mL/min — ABNORMAL LOW (ref >60)
Glucose, Bld: 91 mg/dL (ref 70–99)
Potassium: 4.2 mmol/L (ref 3.5–5.1)
Sodium: 140 mmol/L (ref 135–145)

## 2018-04-13 LAB — HEMOGLOBIN A1C
Hgb A1c MFr Bld: 5.3 % (ref 4.8–5.6)
Mean Plasma Glucose: 105.41 mg/dL

## 2018-04-13 LAB — ECHOCARDIOGRAM LIMITED
Height: 71 in
Weight: 3287.5 oz

## 2018-04-13 MED ORDER — ASPIRIN EC 325 MG PO TBEC
325.0000 mg | DELAYED_RELEASE_TABLET | Freq: Every day | ORAL | Status: DC
  Administered 2018-04-13 – 2018-04-14 (×2): 325 mg via ORAL
  Filled 2018-04-13 (×2): qty 1

## 2018-04-13 MED ORDER — ACETAMINOPHEN 325 MG PO TABS
650.0000 mg | ORAL_TABLET | Freq: Once | ORAL | Status: AC
Start: 2018-04-13 — End: 2018-04-13
  Administered 2018-04-13: 650 mg via ORAL
  Filled 2018-04-13: qty 2

## 2018-04-13 MED ORDER — SODIUM CHLORIDE 0.9 % IV SOLN
INTRAVENOUS | Status: DC
  Administered 2018-04-13: 12:00:00 via INTRAVENOUS

## 2018-04-13 MED ORDER — SODIUM CHLORIDE 0.9 % IV SOLN
2.0000 g | INTRAVENOUS | Status: DC
  Administered 2018-04-13: 2 g via INTRAVENOUS
  Filled 2018-04-13: qty 20

## 2018-04-13 NOTE — Evaluation (Signed)
Physical Therapy Evaluation Patient Details Name: Duane Park MRN: [id number] DOB: 1932/09/09 Today's Date: 04/13/2018   History of Present Illness  Pt is an 84 y/o male admitted secondary to L sided weakness. Imaging revealed acute/subacute R central cerebral peduncal infarct. PMH includes HTN and COPD.   Clinical Impression  Pt admitted secondary to problem above with deficits below. Pt presenting with L extremity weakness, decreased coordination, and decreased balance. REquired mod A for transfer to chair and manual blocking of LLE as pt's knee buckled during transfer. Feel pt is at increased risk for falls given current deficits. Pt very motivated to regain independence as pt was very independent prior to admission. Pt also has excellent family support, so feel pt will be a good candidate for CIR. Will continue to follow acutely to maximize functional mobility independence and safety.     Follow Up Recommendations CIR;Supervision for mobility/OOB    Equipment Recommendations  None recommended by PT    Recommendations for Other Services OT consult     Precautions / Restrictions Precautions Precautions: Fall Restrictions Weight Bearing Restrictions: No      Mobility  Bed Mobility Overal bed mobility: Needs Assistance Bed Mobility: Supine to Sit     Supine to sit: Min assist     General bed mobility comments: Min A for steadying during trunk elevation. Increased time required.   Transfers Overall transfer level: Needs assistance Equipment used: 1 person hand held assist Transfers: Stand Pivot Transfers;Sit to/from Stand Sit to Stand: Mod assist Stand pivot transfers: Mod assist       General transfer comment: Mod A for lift assist and steadying to stand. Required mod A for steadying during transfer to chair as pt's L knee would buckle with weight acceptance on LLE. PT manually blocked LLE during transfer to chair.   Ambulation/Gait             General Gait  Details: NT   Stairs            Wheelchair Mobility    Modified Rankin (Stroke Patients Only) Modified Rankin (Stroke Patients Only) Pre-Morbid Rankin Score: No symptoms Modified Rankin: Moderately severe disability     Balance Overall balance assessment: Needs assistance Sitting-balance support: No upper extremity supported;Feet supported Sitting balance-Leahy Scale: Fair     Standing balance support: Bilateral upper extremity supported;During functional activity Standing balance-Leahy Scale: Poor Standing balance comment: Reliant on UE and external support.                              Pertinent Vitals/Pain Pain Assessment: 0-10 Pain Score: 3  Pain Location: R shoulder, and back (baseline from previous surgery) Pain Descriptors / Indicators: Aching Pain Intervention(s): Limited activity within patient's tolerance;Monitored during session;Repositioned    Home Living Family/patient expects to be discharged to:: Private residence Living Arrangements: Spouse/significant other Available Help at Discharge: Family;Available 24 hours/day Type of Home: House Home Access: Stairs to enter Entrance Stairs-Rails: None Entrance Stairs-Number of Steps: 3 Home Layout: Two level;Able to live on main level with bedroom/bathroom Home Equipment: Gilford Rile - 2 wheels;Bedside commode;Cane - single point      Prior Function Level of Independence: Independent               Hand Dominance   Dominant Hand: Right    Extremity/Trunk Assessment   Upper Extremity Assessment Upper Extremity Assessment: Defer to OT evaluation(Decreased grip strength and decreased shoulder AROM LUE )  Lower Extremity Assessment Lower Extremity Assessment: LLE deficits/detail LLE Deficits / Details: Grossly 3-/5 throughout. Functional weakness noted, as pt's L knee buckled during transfer to chair.  LLE Coordination: decreased gross motor;decreased fine motor    Cervical / Trunk  Assessment Cervical / Trunk Assessment: Kyphotic  Communication   Communication: No difficulties  Cognition Arousal/Alertness: Awake/alert Behavior During Therapy: WFL for tasks assessed/performed Overall Cognitive Status: Within Functional Limits for tasks assessed                                        General Comments General comments (skin integrity, edema, etc.): [name] daughter, wife, and grandson present during session.     Exercises     Assessment/Plan    PT Assessment Patient needs continued PT services  PT Problem List Decreased strength;Decreased balance;Decreased mobility;Decreased knowledge of use of DME       PT Treatment Interventions DME instruction;Gait training;Stair training;Therapeutic activities;Balance training;Therapeutic exercise;Functional mobility training;Patient/family education    PT Goals (Current goals can be found in the Care Plan section)  Acute Rehab PT Goals Patient Stated Goal: to get stronger PT Goal Formulation: With patient Time For Goal Achievement: 04/27/18 Potential to Achieve Goals: Good    Frequency Min 4X/week   Barriers to discharge        Co-evaluation               AM-PAC PT "6 Clicks" Daily Activity  Outcome Measure Difficulty turning over in bed (including adjusting bedclothes, sheets and blankets)?: A Lot Difficulty moving from lying on back to sitting on the side of the bed? : Unable Difficulty sitting down on and standing up from a chair with arms (e.g., wheelchair, bedside commode, etc,.)?: Unable Help needed moving to and from a bed to chair (including a wheelchair)?: A Lot Help needed walking in hospital room?: A Lot Help needed climbing 3-5 steps with a railing? : Total 6 Click Score: 9    End of Session Equipment Utilized During Treatment: Gait belt Activity Tolerance: Patient tolerated treatment well Patient left: in chair;with call bell/phone within reach;with chair alarm set;with  family/visitor present Nurse Communication: Mobility status PT Visit Diagnosis: Unsteadiness on feet (R26.81);Muscle weakness (generalized) (M62.81);Hemiplegia and hemiparesis Hemiplegia - Right/Left: Left Hemiplegia - dominant/non-dominant: Non-dominant Hemiplegia - caused by: Cerebral infarction    Time: 8338-2505 PT Time Calculation (min) (ACUTE ONLY): 16 min   Charges:   PT Evaluation $PT Eval Low Complexity: [place], PT, DPT  Acute Rehabilitation Services  Pager: [phone]   Rudean Hitt 04/13/2018, 2:56 PM

## 2018-04-13 NOTE — H&P (Signed)
Internal Medicine Attending Admission Note Date: 04/13/2018  Patient name: Duane Park Medical record number: [id number] Date of birth: 24-Apr-1932 Age: 84 y.o. Gender: male  I saw and evaluated the patient. I reviewed the resident's note and I agree with the resident's findings and plan as documented in the resident's note.  Chief Complaint(s): Left-sided weakness that began the day prior to admission.  History - key components related to admission:  Duane Park is an 84 year old man with a history of essential hypertension, hyperlipidemia, gastroesophageal reflux disease, and chronic cough who presents with left-sided weakness that began the day prior to admission. He was in his usual state of health until approximately 3 PM on the day prior to admission when he noticed that he started to stumble. By 11:30 the evening prior to admission he noticed some weakness in his left arm and leg. He went to bed and upon awakening on the morning of admission he noted continued left-sided weakness and therefore called EMS. In the emergency department he was evaluated by neurology who felt he had a small vessel ischemic stroke and he was admitted to the internal medicine teaching service. He subsequently underwent MRI of the head which revealed a 6 mm right central cerebral peduncle infarction.  On the morning after admission he noted subjectively improved strength of the left upper extremity and lower extremity. He denied any headaches and had no other acute complaints.  Physical Exam - key components related to admission:  Vitals:   04/12/18 1957 04/13/18 0008 04/13/18 0501 04/13/18 1417  BP: 132/68 137/73 135/81 135/74  Pulse: 66 79 68 76  Resp: 18 18 18 16   Temp: 97.9 F (36.6 C) 98.2 F (36.8 C) (!) 97.5 F (36.4 C) 98 F (36.7 C)  TempSrc: Oral Oral Oral Oral  SpO2: 95% 93% 95% 94%  Weight:      Height:       Gen.: Well-developed, well-nourished, man lying comfortably in bed in no acute  distress. Neuro: Alert and oriented 3. Cranial nerve II-XII intact. Strength is 5/5 in the right upper and lower extremities and 4+/5 in the left upper and lower extremities. There is some dysmetria with finger to nose to finger testing. Sensation is intact to light touch throughout.  Lab results:  Basic Metabolic Panel: Recent Labs    04/12/18 0654 04/12/18 0701 04/13/18 0507  NA 137 139 140  K 4.1 4.0 4.2  CL 105 103 104  CO2 22  --  27  GLUCOSE 92 92 91  BUN 40* 40* 23  CREATININE 1.41* 1.30* 1.27*  CALCIUM 8.9  --  9.2   Liver Function Tests: Recent Labs    04/12/18 0654  AST 18  ALT 12  ALKPHOS 80  BILITOT 1.4*  PROT 5.7*  ALBUMIN 3.3*   CBC: Recent Labs    04/12/18 0654 04/12/18 0701  WBC 9.3  --   NEUTROABS 5.1  --   HGB 16.4 16.3  HCT 49.0 48.0  MCV 103.6*  --   PLT 205  --    Hemoglobin A1C: Recent Labs    04/12/18 0654  HGBA1C 5.3   Fasting Lipid Panel: Recent Labs    04/13/18 0507  CHOL 188  HDL 30*  LDLCALC 126*  TRIG 160*  CHOLHDL 6.3   Coagulation: Recent Labs    04/12/18 0654  INR 1.06   Urine Drug Screen:  Negative for opiates, cocaine, benzodiazepines, amphetamines, THC, and barbiturates.  Alcohol Level: Recent Labs    04/12/18  [place] <10   Urinalysis:  Clear, yellow, specific gravity 1.042, pH 7.0, protein negative, nitrite negative, leukocytes negative.  Imaging results:   Please see radiologist's interpretation in the imaging section  Other results:  EKG: Personally reviewed. Normal sinus rhythm at 77 bpm, left axis deviation, first-degree AV block, no significant Q waves, no LVH by voltage, delayed R wave progression, no ST or T-wave changes. There are no comparisons immediately available.  Assessment & Plan by Problem:  Duane Park is an 84 year old man with a history of essential hypertension, hyperlipidemia, gastroesophageal reflux disease, and chronic cough who presents with left-sided weakness that  began the day prior to admission. He was found to have an acute right central cerebral peduncle infarction. Over the last 24 hours he's noticed some improvement in his left upper and lower extremity weakness, but is still not at baseline.  1) Acute right central cerebral peduncle infarction: We are allowing for permissive hypertension in the acute phase. He is on aspirin 325 mg daily and atorvastatin 80 mg by mouth daily was started. An echocardiogram was obtained and is pending at the time of this dictation. His hemoglobin A1c is 5.3 and rules out diabetes. Physical therapy has assessed the patient and feels he may be an excellent candidate for inpatient rehabilitation. We will ask CIR to assess the patient for candidacy.  2) Chronic cough: He has several possible etiologies including gastroesophageal reflux disease and a postnasal drip upper airway cough syndrome. We will concentrate on his gastroesophageal reflux disease by providing him with 3 months of high-dose PPI therapy, specifically pantoprazole 40 mg by mouth daily. The importance of taking it for the full 3 months before declaring it a failure was stressed.  3) Disposition: Pending CIR evaluation for candidacy. He would also like referral to a primary care provider. He is okay with seeing a provider and [place] and I recommended Duane Park, M.D. who is an outstanding clinician. We will provide Dr. Dois Park name to Duane Park at discharge.

## 2018-04-13 NOTE — Progress Notes (Signed)
Internal Medicine Attending  Date: 04/13/2018  Patient name: Duane Park Medical record number: [id number] Date of birth: 1932/01/20 Age: 84 y.o. Gender: male  I saw and evaluated the patient. I reviewed the resident's note by Dr. Sherry Ruffing and I agree with the resident's findings and plans as documented in her progress note.  Please see my H&P dated 04/13/2018 for the specifics of my evaluation, assessment, and plan from earlier in the day.

## 2018-04-13 NOTE — Progress Notes (Signed)
  Echocardiogram 2D Echocardiogram has been performed.  Duane Park 04/13/2018, 3:35 PM

## 2018-04-13 NOTE — Progress Notes (Signed)
STROKE TEAM PROGRESS NOTE   HISTORY OF PRESENT ILLNESS (per record) Duane Park is a 84 y.o. male past medical history of hypertension, hyperlipidemia, COPD, who was in his usual state of health when he went to bed at 11:30 PM on 04/11/2018 and woke up this morning with left facial droop, left arm and leg weakness and slurred speech. He has never had these symptoms before.  He was not sick prior to this presentation. He denies any headaches, tingling or numbness.  Denies any chest pain shortness of breath.  Denies any cough.  Denies bleeding or bruising. EMS was called, evaluated him on scene, symptoms consistent with a stroke and brought in from [facility] as he was outside the 4-1/2 hours window for IV TPA but was still within the window for intervention per the County/hospital protocol. He did not have any symptoms suggestive of large vessel occlusion based on the description of symptoms by EMS. Patient's awake alert oriented x3 and able to provide reliable history.  No family members at bedside at this time.   LKW: 11:30 PM on 04/11/2018 tpa given?: no, outside the window Premorbid modified Rankin scale (mRS): 0     SUBJECTIVE (INTERVAL HISTORY) His family is at bedside, symptoms resolving    OBJECTIVE Temp:  [97.5 F (36.4 C)-98.2 F (36.8 C)] 98 F (36.7 C) (08/04 1417) Pulse Rate:  [56-79] 76 (08/04 1417) Cardiac Rhythm: Normal sinus rhythm;Bundle branch block;Heart block (08/04 0700) Resp:  [16-18] 16 (08/04 1417) BP: (132-143)/(68-81) 135/74 (08/04 1417) SpO2:  [93 %-99 %] 94 % (08/04 1417)  CBC:  Recent Labs  Lab 04/12/18 0654 04/12/18 0701  WBC 9.3  --   NEUTROABS 5.1  --   HGB 16.4 16.3  HCT 49.0 48.0  MCV 103.6*  --   PLT 205  --     Basic Metabolic Panel:  Recent Labs  Lab 04/12/18 0654 04/12/18 0701 04/13/18 0507  NA 137 139 140  K 4.1 4.0 4.2  CL 105 103 104  CO2 22  --  27  GLUCOSE 92 92 91  BUN 40* 40* 23  CREATININE 1.41* 1.30* 1.27*   CALCIUM 8.9  --  9.2    Lipid Panel:     Component Value Date/Time   CHOL 188 04/13/2018 0507   TRIG 160 (H) 04/13/2018 0507   HDL 30 (L) 04/13/2018 0507   CHOLHDL 6.3 04/13/2018 0507   VLDL 32 04/13/2018 0507   LDLCALC 126 (H) 04/13/2018 0507   HgbA1c:  Lab Results  Component Value Date   HGBA1C 5.3 04/12/2018   Urine Drug Screen:     Component Value Date/Time   LABOPIA NONE DETECTED 04/12/2018 1005   COCAINSCRNUR NONE DETECTED 04/12/2018 1005   LABBENZ NONE DETECTED 04/12/2018 1005   AMPHETMU NONE DETECTED 04/12/2018 1005   THCU NONE DETECTED 04/12/2018 1005   LABBARB NONE DETECTED 04/12/2018 1005    Alcohol Level     Component Value Date/Time   ETH <10 04/12/2018 0958    IMAGING  Ct Angio Head W Or Wo Contrast Ct Angio Neck W Or Wo Contrast Ct Cerebral Perfusion W Contrast 04/12/2018 IMPRESSION:  1. No emergent large vessel occlusion.  2. No significant proximal intracranial arterial stenosis.  3. Mild cervical carotid artery atherosclerosis without stenosis.  4. Mild proximal right vertebral artery stenosis. Hypoplastic left vertebral artery.    Mr Brain Wo Contrast 04/13/2018 IMPRESSION:  1. 6 mm acute/early subacute infarction within the right central cerebral peduncle. No associated  hemorrhage or mass effect.  2. Mild volume loss of the brain for age.  3. Mild ethmoid and maxillary sinus disease.    Ct Head Code Stroke Wo Contrast 04/12/2018 IMPRESSION:  1. Unremarkable CT appearance of the brain for age. 2. ASPECTS is 10.     Transthoracic Echocardiogram - pending 00/00/00     PHYSICAL EXAM Vitals:   04/12/18 1957 04/13/18 0008 04/13/18 0501 04/13/18 1417  BP: 132/68 137/73 135/81 135/74  Pulse: 66 79 68 76  Resp: 18 18 18 16   Temp: 97.9 F (36.6 C) 98.2 F (36.8 C) (!) 97.5 F (36.4 C) 98 F (36.7 C)  TempSrc: Oral Oral Oral Oral  SpO2: 95% 93% 95% 94%  Weight:      Height:        Physical exam: Exam: Gen: NAD, conversant                   CV: RRR, no MRG. No Carotid Bruits. No peripheral edema, warm, nontender Eyes: Conjunctivae clear without exudates or hemorrhage  Neuro: Detailed Neurologic Exam  Speech:    Speech is normal; fluent and spontaneous with normal comprehension.  Cognition:    The patient is oriented to person, place, and time;   Cranial Nerves:    The pupils are equal, round, and reactive to light. Visual fields are full to finger confrontation. Extraocular movements are intact. Trigeminal sensation is intact and the muscles of mastication are normal. The face is symmetric. The palate elevates in the midline. Hearing intact. Voice is normal. Shoulder shrug is normal. The tongue has normal motion without fasciculations.   Coordination:    Normal finger to nose  Motor Observation:    No asymmetry, no atrophy, and no involuntary movements noted. Tone:    Normal muscle tone.    Posture:    Posture is normal. normal erect    Strength:    Strength is V/V in the upper and lower limbs.      Sensation: intact to LT      ASSESSMENT/PLAN Duane Park is a 84 y.o. male with history of hypertension, hyperlipidemia, COPD, presenting with left facial droop, left arm and leg weakness and slurred speech. He did not receive IV t-PA due to   late presentation.   6 mm acute/early subacute infarction within the right central cerebral peduncle - small vessel.  Resultant  resolving  CT head - Unremarkable CT appearance of the brain for age  MRI head - 6 mm acute/early subacute infarction within the right central cerebral peduncle  MRA head - not performed  CTA H&N w Perfusion - Hypoplastic left vertebral artery.   UDS - negative  Carotid Doppler - CTA neck performed - carotid dopplers not indicated.  2D Echo - normal EF, no thrombus   LDL - 126  HgbA1c - 5.3  VTE prophylaxis - Lovenox Diet Order           Diet regular Room service appropriate? Yes; Fluid consistency: Thin  Diet  effective now          No antithrombotic prior to admission, now on No antithrombotic Will start ASA 325 mg daily  Patient counseled to be compliant with his antithrombotic medications  Ongoing aggressive stroke risk factor management  Therapy recommendations:  pending  Disposition:  Pending  Hypertension  Stable . Permissive hypertension (OK if < 220/120) but gradually normalize in 5-7 days . Long-term BP goal normotensive  Hyperlipidemia  Lipid lowering medication PTA:  none  LDL , goal < 70  Current lipid lowering medication: Lipitor 80 mg daily  Continue statin at discharge   Other Stroke Risk Factors  Advanced age  Overweight, Body mass index is 28.66 kg/m., recommend weight loss, diet and exercise as appropriate     Other Active Problems  Elevated BUN and creatinine -> improving   Plan Continue Anticoagulation after discharge Stroke team will sign off at this time   Hospital day # 1  Personally examined patient and images, and have participated in and made any corrections needed to history, physical, neuro exam,assessment and plan as stated above.  I have personally obtained the history, evaluated lab date, reviewed imaging studies and agree with radiology interpretations.    Sarina Ill, MD Stroke Neurology  To contact Stroke Continuity provider, please refer to [email or web address] After hours, contact General Neurology

## 2018-04-13 NOTE — Progress Notes (Signed)
Subjective: Patient reports feeling a little tired but stronger than yesterday.  No acute events overnight.  Patient denies any dizziness, headaches, or chest pain.  He is eating well.  Discussed with patient to work with physical therapist to improve strength, patient is in agreement.  Objective:  Vital signs in last 24 hours: Vitals:   04/12/18 1618 04/12/18 1957 04/13/18 0008 04/13/18 0501  BP: (!) 143/71 132/68 137/73 135/81  Pulse: (!) 56 66 79 68  Resp: 18 18 18 18   Temp: 97.7 F (36.5 C) 97.9 F (36.6 C) 98.2 F (36.8 C) (!) 97.5 F (36.4 C)  TempSrc: Oral Oral Oral Oral  SpO2: 99% 95% 93% 95%  Weight:      Height:       General: Well-nourished, well appearing, no acute distress HEENT: Normocephalic, atraumatic, trachea midline Cardiac: Regular rate and rhythm, no murmurs rubs or gallops Pulmonary: Lungs clear to auscultation no wheeze, rales Abdomen: Abdomen soft, nontender, no guarding, positive bowel sounds Extremity: Lower extremity edema, no rashes or lesions noted Neuro: PERRLA, EOMI, sensation to light touch intact throughout, Motor: 4/5 in left upper and lower extremity, 5/5 in right extremities. Cerebellar: mild dysmetria with left finger to nose testing, normal heel to shin testing.  Psychiatry: Normal mood and affect   Assessment/Plan: This is a 84 year old male with a PMH of HTN, HLD, and GERD presenting with left sided weakness and was admitted for a code stroke.   CVA: Patient presented with left sided weakness that started 1 day prior to admission. He reports that he started not feeling very well around 3 PM and then at 11 PM he noticed some weakness in his left upper and lower extremities.  He woke up in the morning and was still having the symptoms EMS was called.  He was found to be hypertensive on admission with left facial droop, 4/5 left upper and lower extremity strength, mild dysmetria on finger-to-nose test.  Initial CT scan showed no acute change  with aspects 10.  EKG showed normal sinus rhythm.  CTA head and neck showed no emergent large vessel occlusion, no significant arterial stenosis, mild cervical carotid artery atherosclerosis, mild proximal right vertebral artery stenosis and hypoplastic left vertebral artery.  Neurology evaluated, no TPA given due to him being outside the window. He was stated on ASA and atorvastatin. His MRI showed a 32mm acute/early subacute infarction in the right central cerebral peduncle. No associated hemorrhage or mass effect, with mild volume loss of brain and mild ethmoid and maxillary sinus disease. Patients left extremity strength and dysmetria improved today compared to yesterday.  -F/u A1C -Lipids: Cholesterol: 188, HDL 20, LDL 126, triglycerides 160 -F/u Echocardiogram:  -PT evaluation: Continued PT services with CIR -Continue ASA 325 daily -Continue atorvastatin 80 mg daily -Q4 neuro checks  Chronic cough: Patient has been having a episodes of dry cough, congestion, and rhinorrhea that lasts for 4-5 days and improves with breathing treatments and antibiotics. This has happened 4 times in the last 8 months. He has had one visit with a pulmonologist and was prescribed dulera and have planned to get a PFT in a few weeks. He has not used that recently because he has not been having symptoms. Will increase the pantoprazole here and discharge on the higher dose. Another possibility is post nasal drip due to sinus infections, he does report that he gets rhinorrhea with these symptoms and his MRI did showed mild ethmoid and maxillary sinus disease. This could  be due to a new onset COPD and he already has plans to follow up with pulmonology to get PFTs. He does have a history of GERD and is on omperazole at home for this however this could be due to  his GERD and he may need a higher dose of a PPI to help control the cough.  -Increase pantoprazole to 80 mg daily -Duonebs PRN  Essential hypertension: Patient is no  benicar at home. Patient has been normotensive during hospitalization.  -Hold home medications  GERD: Patient is on omeprazole at home. This may be contributing to his episodes of cough.  -Increase omeprazole to 80mg  daily  FEN: No fluids, replete lytes prn, regular diet  VTE ppx: Lovenox  Code Status: DNR     Dispo: Anticipated discharge pending CIR placement.  Asencion Noble, MD 04/13/2018, 6:30 AM Pager: [phone]

## 2018-04-14 ENCOUNTER — Inpatient Hospital Stay (HOSPITAL_COMMUNITY)
Admission: RE | Admit: 2018-04-14 | Discharge: 2018-04-22 | DRG: 057 | Disposition: A | Discharge status: Home / Self Care | Payer: Medicare Other | Source: Intra-hospital | Attending: Physical Medicine & Rehabilitation | Admitting: Physical Medicine & Rehabilitation

## 2018-04-14 ENCOUNTER — Other Ambulatory Visit: Payer: Self-pay

## 2018-04-14 ENCOUNTER — Encounter (HOSPITAL_COMMUNITY): Payer: Self-pay | Admitting: *Deleted

## 2018-04-14 DIAGNOSIS — D7589 Other specified diseases of blood and blood-forming organs: Secondary | ICD-10-CM | POA: Diagnosis not present

## 2018-04-14 DIAGNOSIS — E7800 Pure hypercholesterolemia, unspecified: Secondary | ICD-10-CM | POA: Diagnosis present

## 2018-04-14 DIAGNOSIS — G8929 Other chronic pain: Secondary | ICD-10-CM

## 2018-04-14 DIAGNOSIS — Z8249 Family history of ischemic heart disease and other diseases of the circulatory system: Secondary | ICD-10-CM | POA: Diagnosis not present

## 2018-04-14 DIAGNOSIS — E785 Hyperlipidemia, unspecified: Secondary | ICD-10-CM

## 2018-04-14 DIAGNOSIS — G8194 Hemiplegia, unspecified affecting left nondominant side: Secondary | ICD-10-CM | POA: Diagnosis not present

## 2018-04-14 DIAGNOSIS — I69354 Hemiplegia and hemiparesis following cerebral infarction affecting left non-dominant side: Principal | ICD-10-CM

## 2018-04-14 DIAGNOSIS — I10 Essential (primary) hypertension: Secondary | ICD-10-CM | POA: Diagnosis not present

## 2018-04-14 DIAGNOSIS — Z7951 Long term (current) use of inhaled steroids: Secondary | ICD-10-CM

## 2018-04-14 DIAGNOSIS — R29810 Facial weakness: Secondary | ICD-10-CM

## 2018-04-14 DIAGNOSIS — I739 Peripheral vascular disease, unspecified: Secondary | ICD-10-CM | POA: Diagnosis not present

## 2018-04-14 DIAGNOSIS — I69392 Facial weakness following cerebral infarction: Secondary | ICD-10-CM

## 2018-04-14 DIAGNOSIS — R2689 Other abnormalities of gait and mobility: Secondary | ICD-10-CM | POA: Diagnosis not present

## 2018-04-14 DIAGNOSIS — R471 Dysarthria and anarthria: Secondary | ICD-10-CM

## 2018-04-14 DIAGNOSIS — I6503 Occlusion and stenosis of bilateral vertebral arteries: Secondary | ICD-10-CM

## 2018-04-14 DIAGNOSIS — R269 Unspecified abnormalities of gait and mobility: Secondary | ICD-10-CM | POA: Diagnosis not present

## 2018-04-14 DIAGNOSIS — I69398 Other sequelae of cerebral infarction: Secondary | ICD-10-CM | POA: Diagnosis not present

## 2018-04-14 DIAGNOSIS — N183 Chronic kidney disease, stage 3 unspecified: Secondary | ICD-10-CM

## 2018-04-14 DIAGNOSIS — I6330 Cerebral infarction due to thrombosis of unspecified cerebral artery: Secondary | ICD-10-CM

## 2018-04-14 DIAGNOSIS — J45909 Unspecified asthma, uncomplicated: Secondary | ICD-10-CM

## 2018-04-14 DIAGNOSIS — Z79899 Other long term (current) drug therapy: Secondary | ICD-10-CM

## 2018-04-14 DIAGNOSIS — M159 Polyosteoarthritis, unspecified: Secondary | ICD-10-CM

## 2018-04-14 DIAGNOSIS — I69393 Ataxia following cerebral infarction: Secondary | ICD-10-CM | POA: Diagnosis not present

## 2018-04-14 DIAGNOSIS — J449 Chronic obstructive pulmonary disease, unspecified: Secondary | ICD-10-CM | POA: Diagnosis present

## 2018-04-14 DIAGNOSIS — R7989 Other specified abnormal findings of blood chemistry: Secondary | ICD-10-CM | POA: Diagnosis not present

## 2018-04-14 DIAGNOSIS — I6529 Occlusion and stenosis of unspecified carotid artery: Secondary | ICD-10-CM

## 2018-04-14 DIAGNOSIS — Z66 Do not resuscitate: Secondary | ICD-10-CM

## 2018-04-14 DIAGNOSIS — I5189 Other ill-defined heart diseases: Secondary | ICD-10-CM

## 2018-04-14 LAB — BASIC METABOLIC PANEL
Anion gap: 9 (ref 5–15)
BUN: 24 mg/dL — ABNORMAL HIGH (ref 8–23)
CO2: 27 mmol/L (ref 22–32)
Calcium: 8.9 mg/dL (ref 8.9–10.3)
Chloride: 101 mmol/L (ref 98–111)
Creatinine, Ser: 1.38 mg/dL — ABNORMAL HIGH (ref 0.61–1.24)
GFR calc Af Amer: 53 mL/min — ABNORMAL LOW (ref >60)
GFR calc non Af Amer: 46 mL/min — ABNORMAL LOW (ref >60)
Glucose, Bld: 88 mg/dL (ref 70–99)
Potassium: 3.8 mmol/L (ref 3.5–5.1)
Sodium: 137 mmol/L (ref 135–145)

## 2018-04-14 MED ORDER — ORAL CARE MOUTH RINSE
15.0000 mL | Freq: Two times a day (BID) | OROMUCOSAL | Status: DC
  Administered 2018-04-14: 15 mL via OROMUCOSAL

## 2018-04-14 MED ORDER — ACETAMINOPHEN 325 MG PO TABS
325.0000 mg | ORAL_TABLET | ORAL | Status: DC | PRN
  Administered 2018-04-14 – 2018-04-18 (×5): 650 mg via ORAL
  Administered 2018-04-19: 325 mg via ORAL
  Administered 2018-04-20 – 2018-04-22 (×4): 650 mg via ORAL
  Filled 2018-04-14 (×10): qty 2

## 2018-04-14 MED ORDER — ONDANSETRON HCL 4 MG PO TABS: 4.0000 mg | ORAL_TABLET | Freq: Four times a day (QID) | ORAL | Status: DC | PRN

## 2018-04-14 MED ORDER — PANTOPRAZOLE SODIUM 40 MG PO TBEC
80.0000 mg | DELAYED_RELEASE_TABLET | Freq: Every day | ORAL | Status: DC
  Administered 2018-04-15 – 2018-04-22 (×8): 80 mg via ORAL
  Filled 2018-04-14 (×8): qty 2

## 2018-04-14 MED ORDER — ENOXAPARIN SODIUM 40 MG/0.4ML ~~LOC~~ SOLN: 40.0000 mg | SUBCUTANEOUS | Status: DC

## 2018-04-14 MED ORDER — SORBITOL 70 % SOLN: 30.0000 mL | Freq: Every day | Status: DC | PRN

## 2018-04-14 MED ORDER — ASPIRIN EC 81 MG PO TBEC
81.0000 mg | DELAYED_RELEASE_TABLET | Freq: Every day | ORAL | Status: DC
  Administered 2018-04-15 – 2018-04-22 (×8): 81 mg via ORAL
  Filled 2018-04-14 (×8): qty 1

## 2018-04-14 MED ORDER — BLOOD PRESSURE CONTROL BOOK
Freq: Once | Status: DC
  Filled 2018-04-14: qty 1

## 2018-04-14 MED ORDER — PANTOPRAZOLE SODIUM 40 MG PO TBEC: 80.0000 mg | DELAYED_RELEASE_TABLET | Freq: Every day | ORAL | 3 refills | Status: DC

## 2018-04-14 MED ORDER — ENOXAPARIN SODIUM 40 MG/0.4ML ~~LOC~~ SOLN
40.0000 mg | SUBCUTANEOUS | Status: DC
  Administered 2018-04-15 – 2018-04-22 (×8): 40 mg via SUBCUTANEOUS
  Filled 2018-04-14 (×8): qty 0.4

## 2018-04-14 MED ORDER — ATORVASTATIN CALCIUM 80 MG PO TABS: 80.0000 mg | ORAL_TABLET | Freq: Every day | ORAL | 3 refills | Status: DC

## 2018-04-14 MED ORDER — ASPIRIN EC 81 MG PO TBEC: 81.0000 mg | DELAYED_RELEASE_TABLET | Freq: Every day | ORAL | Status: DC

## 2018-04-14 MED ORDER — ASPIRIN 81 MG PO TBEC: 81.0000 mg | DELAYED_RELEASE_TABLET | Freq: Every day | ORAL | 3 refills | Status: AC

## 2018-04-14 MED ORDER — ATORVASTATIN CALCIUM 80 MG PO TABS
80.0000 mg | ORAL_TABLET | Freq: Every day | ORAL | Status: DC
  Administered 2018-04-14 – 2018-04-21 (×8): 80 mg via ORAL
  Filled 2018-04-14 (×8): qty 1

## 2018-04-14 MED ORDER — ONDANSETRON HCL 4 MG/2ML IJ SOLN: 4.0000 mg | Freq: Four times a day (QID) | INTRAMUSCULAR | Status: DC | PRN

## 2018-04-14 MED ORDER — CHLORHEXIDINE GLUCONATE 0.12 % MT SOLN
15.0000 mL | Freq: Two times a day (BID) | OROMUCOSAL | Status: DC
  Administered 2018-04-14: 15 mL via OROMUCOSAL
  Filled 2018-04-14: qty 15

## 2018-04-14 MED ORDER — PSYLLIUM 95 % PO PACK
1.0000 | PACK | Freq: Every day | ORAL | Status: DC
  Administered 2018-04-15 – 2018-04-22 (×8): 1 via ORAL
  Filled 2018-04-14 (×8): qty 1

## 2018-04-14 NOTE — PMR Pre-admission (Signed)
PMR Admission Coordinator Pre-Admission Assessment  Patient: Duane Park is an 84 y.o., male MRN: [id number] DOB: June 23, 1932 Height: 5\' 11"  (180.3 cm) Weight: 93.2 kg (205 lb 7.5 oz)              Insurance Information HMO:     PPO:      PCP:      IPA:      80/20:      OTHER:  PRIMARY: Medicare A & B      Policy#: [id number]      Subscriber: Self CM Name:       Phone#:      Fax#:  Pre-Cert#: Eligible       Employer: Retired  Benefits:  Phone #: Verified online      Name: [employer] Portal  Eff. Date: 04/11/99     Deduct: $1364      Out of Pocket Max: N/A      Life Max: N/A CIR: 100%      SNF: 100% days, 1-10; 80% days 21-100 Outpatient: 80%     Co-Pay: 20% Home Health: 100%      Co-Pay: $0 DME: 80%     Co-Pay: 20% Providers: Patient's Choice   SECONDARY: [employer]       Policy#: [id number]      Subscriber: Self CM Name:       Phone#:      Fax#:  Pre-Cert#:       Employer: Retired  Benefits:  Phone #: [phone]     Name:  Eff. Date:      Deduct:       Out of Pocket Max:       Life Max:  CIR:       SNF:  Outpatient:      Co-Pay:  Home Health:       Co-Pay:  DME:      Co-Pay:   Medicaid Application Date:       Case Manager:  Disability Application Date:       Case Worker:   Emergency Contact Information Contact Information    Name Relation Home Work Mobile   Duane Park Spouse [phone]     Duane Park Daughter [phone]  [phone]     Current Medical History  Patient Admitting Diagnosis: Infarction within the right central cerebral peduncle  History of Present Illness: Duane Park is an 84 year old right-handed male with history of hypertension, hyperlipidemia.  Presented 04/13/2018 with left-sided weakness.  Per report, wife and patient, patient lives with spouse.  Independent prior to admission.  2 level home with bedroom on Main level and 3 steps to entry.  Wife can assist as needed.  There is a daughter in the area that works.   Cranial CT scan reviewed, unremarkable for acute intracranial process.  CT angiogram of head and neck showed no emergent large vessel occlusion or stenosis.  He did not receive TPA.  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  Echocardiogram with ejection fraction of 60% no wall motion abnormalities grade 1 diastolic dysfunction.  Currently maintained on aspirin for CVA prophylaxis and subcutaneous Lovenox for DVT prophylaxis.  He is tolerating a regular diet.  Therapy evaluations have been completed with recommendations of physical medicine rehab consult.  Patient was admitted for a comprehensive rehab program 04/14/18.  NIH Total: 5    Past Medical History  Past Medical History:  Diagnosis Date  . COPD (chronic obstructive  pulmonary disease) ([place])   . Hypercholesteremia   . Hypertension     Family History  family history includes Heart disease in his father; Lung disease in his mother.  Prior Rehab/Hospitalizations:  Has the patient had major surgery during 100 days prior to admission? No  Current Medications   Current Facility-Administered Medications:  .  0.9 %  sodium chloride infusion, , Intravenous, Continuous, Oval Linsey, MD, Stopped at 04/13/18 1307 .  aspirin EC tablet 81 mg, 81 mg, Oral, Daily, Ledell Noss, MD .  atorvastatin (LIPITOR) tablet 80 mg, 80 mg, Oral, q1800, Ledell Noss, MD, 80 mg at 04/13/18 1803 .  chlorhexidine (PERIDEX) 0.12 % solution 15 mL, 15 mL, Mouth Rinse, BID, Oval Linsey, MD, 15 mL at 04/14/18 0808 .  enoxaparin (LOVENOX) injection 40 mg, 40 mg, Subcutaneous, Q24H, Ledell Noss, MD, 40 mg at 04/14/18 0865 .  MEDLINE mouth rinse, 15 mL, Mouth Rinse, q12n4p, Oval Linsey, MD, 15 mL at 04/14/18 1202 .  pantoprazole (PROTONIX) EC tablet 80 mg, 80 mg, Oral, Daily, Ledell Noss, MD, 80 mg at 04/14/18 0808 .  psyllium (HYDROCIL/METAMUCIL) packet 1 packet, 1 packet, Oral, Daily, Ledell Noss, MD,  1 packet at 04/14/18 0807  Patients Current Diet:  Diet Order           Diet regular Room service appropriate? Yes; Fluid consistency: Thin  Diet effective now          Precautions / Restrictions Precautions Precautions: Fall Restrictions Weight Bearing Restrictions: No   Has the patient had 2 or more falls or a fall with injury in the past year?No  Prior Activity Level Community (5-7x/wk): Prior to admission patient was fully independent, driving, and active.    Home Assistive Devices / Equipment Home Assistive Devices/Equipment: None Home Equipment: Walker - 2 wheels, Bedside commode, Cane - single point  Prior Device Use: Indicate devices/aids used by the patient prior to current illness, exacerbation or injury? None of the above  Prior Functional Level Prior Function Level of Independence: Independent  Self Care: Did the patient need help bathing, dressing, using the toilet or eating? Independent  Indoor Mobility: Did the patient need assistance with walking from room to room (with or without device)? Independent  Stairs: Did the patient need assistance with internal or external stairs (with or without device)? Independent  Functional Cognition: Did the patient need help planning regular tasks such as shopping or remembering to take medications? Independent  Current Functional Level Cognition  Overall Cognitive Status: Within Functional Limits for tasks assessed Orientation Level: Oriented X4    Extremity Assessment (includes Sensation/Coordination)  Upper Extremity Assessment: LUE deficits/detail LUE Deficits / Details: 4-/5 LUE Sensation: decreased proprioception LUE Coordination: decreased fine motor, decreased gross motor  Lower Extremity Assessment: Defer to PT evaluation LLE Deficits / Details: Grossly 3-/5 throughout. Functional weakness noted, as pt's L knee buckled during transfer to chair.  LLE Coordination: decreased gross motor, decreased fine motor     ADLs  Overall ADL's : Needs assistance/impaired Eating/Feeding: Set up, Sitting Grooming: Min guard, Standing, Wash/dry hands Upper Body Bathing: Minimal assistance, Sitting Lower Body Bathing: Minimal assistance, Sit to/from stand Upper Body Dressing : Set up, Sitting Lower Body Dressing: Minimal assistance, Sit to/from stand Lower Body Dressing Details (indicate cue type and reason): donned socks with increased time Toilet Transfer: Minimal assistance, Min guard, RW, Ambulation Toileting- Clothing Manipulation and Hygiene: Minimal assistance, Sit to/from stand Functional mobility during ADLs: Minimal assistance, Rolling walker    Mobility  Overal  bed mobility: Needs Assistance Bed Mobility: Supine to Sit Supine to sit: Supervision General bed mobility comments: increased time, no assist, HOB up 30 degrees    Transfers  Overall transfer level: Needs assistance Equipment used: Rolling walker (2 wheeled) Transfers: Sit to/from Stand Sit to Stand: Min guard Stand pivot transfers: Mod assist General transfer comment: min guard for safety, cues for hand placement    Ambulation / Gait / Stairs / Wheelchair Mobility  Ambulation/Gait General Gait Details: NT     Posture / Balance Dynamic Sitting Balance Sitting balance - Comments: no LOB with donning socks Balance Overall balance assessment: Needs assistance Sitting-balance support: No upper extremity supported, Feet supported Sitting balance-Leahy Scale: Good Sitting balance - Comments: no LOB with donning socks Standing balance support: Bilateral upper extremity supported, During functional activity Standing balance-Leahy Scale: Poor Standing balance comment: Reliant on UE and external support.     Special needs/care consideration BiPAP/CPAP: No CPM: No Continuous Drip IV: No Dialysis: No        Life Vest: No Oxygen: No Special Bed: No Trach Size: No Wound Vac (area): No       Skin: Dry                                 Bowel mgmt: Continent, last BM 04/14/18 Bladder mgmt: Continent with urgency and assist needed to stand and use urinal about every 2 hours  Diabetic mgmt: No     Previous Home Environment Living Arrangements: Spouse/significant other Available Help at Discharge: Family, Available 24 hours/day Type of Home: House Home Layout: Two level, Able to live on main level with bedroom/bathroom Home Access: Stairs to enter Entrance Stairs-Rails: None Entrance Stairs-Number of Steps: 3 Bathroom Shower/Tub: Multimedia programmer: Handicapped height Home Care Services: No  Discharge Living Setting Plans for Discharge Living Setting: Patient's home, Lives with (comment)(Spouse ) Type of Home at Discharge: House Discharge Home Layout: Two level, Able to live on main level with bedroom/bathroom Alternate Level Stairs-Rails: Left Alternate Level Stairs-Number of Steps: 14 steps Discharge Home Access: Stairs to enter Entrance Stairs-Rails: (grab bars on each side ) Entrance Stairs-Number of Steps: 3 Discharge Bathroom Shower/Tub: Gaffer, Door(seat) Discharge Bathroom Toilet: Handicapped height Discharge Bathroom Accessibility: Yes How Accessible: Accessible via walker Does the patient have any problems obtaining your medications?: No  Social/Family/Support Systems Patient Roles: Spouse, Parent Contact Information: Spouse Anticipated Caregiver: Spouse Anticipated Caregiver's Contact Information: see above  Ability/Limitations of Caregiver: Supervision-Min A Caregiver Availability: 24/7 Discharge Plan Discussed with Primary Caregiver: Yes Is Caregiver In Agreement with Plan?: Yes Does Caregiver/Family have Issues with Lodging/Transportation while Pt is in Rehab?: No  Goals/Additional Needs Patient/Family Goal for Rehab: PT/OT: Supervision-Min A Expected length of stay: 9-13 days  Cultural Considerations: [employer]  Dietary Needs: Regular textures and thin liquids  Equipment  Needs: TBD Pt/Family Agrees to Admission and willing to participate: Yes Program Orientation Provided & Reviewed with Pt/Caregiver Including Roles  & Responsibilities: Yes  Decrease burden of Care through IP rehab admission:  No   Possible need for SNF placement upon discharge: No  Patient Condition: This patient's condition remains as documented in the consult dated 04/14/18, in which the Rehabilitation Physician determined and documented that the patient's condition is appropriate for intensive rehabilitative care in an inpatient rehabilitation facility. Will admit to inpatient rehab today.  Preadmission Screen Completed By:  Gunnar Fusi, 04/14/2018 3:33 PM ______________________________________________________________________   Discussed  status with Dr. Posey Pronto on 04/14/18 at 1545  and received telephone approval for admission today.  Admission Coordinator:  Gunnar Fusi, time 1545/Date 04/14/18

## 2018-04-14 NOTE — Progress Notes (Signed)
Patient ID: Duane Park, male   DOB: [birth year], 84 y.o.   MRN: [id number] Admit to unit via chair. oreinted to unit, plan of care, medications and rehab routine. States an understanding of information provided. Margarito Liner

## 2018-04-14 NOTE — Progress Notes (Signed)
Patient stated that he noticed he was having double vision at times, first time was when working with therapy this morning. MD paged. Will continue to monitor.

## 2018-04-14 NOTE — H&P (Signed)
Physical Medicine and Rehabilitation Admission H&P    Chief Complaint  Patient presents with  . Code Stroke  : HPI: [place]. [place] is an 84 year old right-handed male with history of hypertension, hyperlipidemia.  Presented 04/13/2018 with left-sided weakness.  Per report, wife and patient, patient lives with spouse.  Independent prior to admission.  2 level home with bedroom on Main level and 3 steps to entry.  Wife can assist as needed.  There is a daughter in the area that works.  Cranial CT scan reviewed, unremarkable for acute intracranial process.  CT angiogram of head and neck showed no emergent large vessel occlusion or stenosis.  He did not receive TPA.  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  Echocardiogram with ejection fraction of 60% no wall motion abnormalities grade 1 diastolic dysfunction.  Currently maintained on aspirin for CVA prophylaxis and subcutaneous Lovenox for DVT prophylaxis.  He is tolerating a regular diet.  Therapy evaluations have been completed with recommendations of physical medicine rehab consult.  Patient was admitted for a comprehensive rehab program.  Review of Systems  Constitutional: Negative for chills and fever.  HENT: Negative for hearing loss.   Eyes: Negative for blurred vision and double vision.  Respiratory:       Occasional shortness of breath with exertion as well as chronic cough  Cardiovascular: Positive for leg swelling. Negative for chest pain and palpitations.  Gastrointestinal: Positive for constipation. Negative for nausea.       GERD  Genitourinary: Negative for dysuria, flank pain and hematuria.  Musculoskeletal: Positive for myalgias.  Skin: Negative for rash.  Neurological: Positive for focal weakness.  All other systems reviewed and are negative.  Past Medical History:  Diagnosis Date  . COPD (chronic obstructive pulmonary disease) ([place])   .  Hypercholesteremia   . Hypertension    Past Surgical History:  Procedure Laterality Date  . BACK SURGERY     Family History  Problem Relation Age of Onset  . Lung disease Mother        never smoker  . Heart disease Father    Social History:  reports that he has never smoked. He has never used smokeless tobacco. He reports that he drank alcohol. He reports that he does not use drugs. Allergies: No Known Allergies Medications Prior to Admission  Medication Sig Dispense Refill  . albuterol (PROAIR HFA) 108 (90 Base) MCG/ACT inhaler Inhale 2 puffs into the lungs every 6 (six) hours as needed for wheezing or shortness of breath.    . Carboxymethylcellulose Sodium (THERATEARS) 0.25 % SOLN Apply 1 drop to eye as needed (dry eyes).    . diphenhydrAMINE-Phenylephrine (DELSYM NIGHT TIME COUGH/COLD) 6.25-2.5 MG/5ML LIQD Take 5 mLs by mouth as needed (cough).    . famotidine (PEPCID) 20 MG tablet Take 20 mg by mouth daily.    . fluticasone (FLONASE) 50 MCG/ACT nasal spray Place 2 sprays into both nostrils daily at 12 noon.  2  . ipratropium-albuterol (DUONEB) 0.5-2.5 (3) MG/3ML SOLN Take 3 mLs by nebulization every 4 (four) hours as needed.    . Liniments (BLUE-EMU SUPER STRENGTH EX) Apply 1 application topically as needed (shoulder pain).    . mometasone-formoterol (DULERA) 100-5 MCG/ACT AERO Inhale 2 puffs into the lungs 2 (two) times daily. 1 Inhaler 11  . montelukast (SINGULAIR) 10 MG tablet Take 10 mg by mouth at bedtime.    Marland Kitchen olmesartan-hydrochlorothiazide (BENICAR HCT) 40-25 MG  tablet Take 1 tablet by mouth daily.    Marland Kitchen omeprazole (PRILOSEC) 40 MG capsule Take 40 mg by mouth daily.    Marland Kitchen Phytosterol Esters (CHOLEST CARE) 500 MG CAPS Take 1 Can by mouth daily.    . Psyllium (EQ DAILY FIBER PO) Take 1 capsule by mouth daily.    Marland Kitchen UNABLE TO FIND Med Name: Curamin OTC daily    . VIRTUSSIN A/C 100-10 MG/5ML syrup Take 5 mLs by mouth 4 (four) times daily as needed for cough.  0    Drug Regimen  Review Drug regimen was reviewed and remains appropriate with no significant issues identified  Home: Home Living Family/patient expects to be discharged to:: Private residence Living Arrangements: Spouse/significant other Available Help at Discharge: Family, Available 24 hours/day Type of Home: House Home Access: Stairs to enter [employer] of Steps: 3 Entrance Stairs-Rails: None Home Layout: Two level, Able to live on main level with bedroom/bathroom Bathroom Shower/Tub: Multimedia programmer: Handicapped height Home Equipment: Environmental consultant - 2 wheels, Bedside commode, Cane - single point   Functional History: Prior Function Level of Independence: Independent  Functional Status:  Mobility: Bed Mobility Overal bed mobility: Needs Assistance Bed Mobility: Supine to Sit Supine to sit: Supervision General bed mobility comments: increased time, no assist, HOB up 30 degrees Transfers Overall transfer level: Needs assistance Equipment used: Rolling walker (2 wheeled) Transfers: Sit to/from Stand Sit to Stand: Min guard Stand pivot transfers: Mod assist General transfer comment: min guard for safety, cues for hand placement Ambulation/Gait General Gait Details: NT     ADL: ADL Overall ADL's : Needs assistance/impaired Eating/Feeding: Set up, Sitting Grooming: Min guard, Standing, Wash/dry hands Upper Body Bathing: Minimal assistance, Sitting Lower Body Bathing: Minimal assistance, Sit to/from stand Upper Body Dressing : Set up, Sitting Lower Body Dressing: Minimal assistance, Sit to/from stand Lower Body Dressing Details (indicate cue type and reason): donned socks with increased time Toilet Transfer: Minimal assistance, Min guard, RW, Ambulation Toileting- Clothing Manipulation and Hygiene: Minimal assistance, Sit to/from stand Functional mobility during ADLs: Minimal assistance, Rolling walker  Cognition: Cognition Overall Cognitive Status: Within  Functional Limits for tasks assessed Orientation Level: Oriented X4 Cognition Arousal/Alertness: Awake/alert Behavior During Therapy: WFL for tasks assessed/performed Overall Cognitive Status: Within Functional Limits for tasks assessed  Physical Exam: Blood pressure 127/77, pulse 73, temperature 98.6 F (37 C), temperature source Oral, resp. rate 20, height _0  (1.803 m), weight 93.2 kg (205 lb 7.5 oz), SpO2 97 %. Physical Exam  Vitals reviewed. Constitutional: He is oriented to person, place, and time. He appears well-developed and well-nourished.  HENT:  Head: Normocephalic and atraumatic.  Eyes: EOM are normal. Right eye exhibits no discharge. Left eye exhibits no discharge.  Neck: Normal range of motion. Neck supple. No thyromegaly present.  Cardiovascular: Normal rate, regular rhythm and normal heart sounds.  Respiratory: Effort normal and breath sounds normal. No respiratory distress.  Good inspiratory effort  GI: Soft. Bowel sounds are normal. He exhibits no distension.  Musculoskeletal:  No edema or tenderness in extremities  Neurological: He is alert and oriented to person, place, and time.  Follows commands.   Fair awareness of deficits Mild left facial weakness Motor: Right upper extremity/right lower extremity: 4+/5 proximal distal Left upper extremity/left lower extremity: 4-/5 proximal to distal (lower extremity with good upper extremity) Sensation intact light touch   Skin: Skin is warm and dry.    Results for orders placed or performed during the hospital encounter of 04/12/18 (  from the past 48 hour(s))  Lipid panel     Status: Abnormal   Collection Time: 04/13/18  5:07 AM  Result Value Ref Range   Cholesterol 188 0 - 200 mg/dL   Triglycerides 160 (H) <150 mg/dL   HDL 30 (L) >40 mg/dL   Total CHOL/HDL Ratio 6.3 RATIO   VLDL 32 0 - 40 mg/dL   LDL Cholesterol 126 (H) 0 - 99 mg/dL    Comment:        Total Cholesterol/HDL:CHD Risk Coronary Heart Disease  Risk Table                     Men   Women  1/2 Average Risk   3.4   3.3  Average Risk       5.0   4.4  2 X Average Risk   9.6   7.1  3 X Average Risk  23.4   11.0        Use the calculated Patient Ratio above and the CHD Risk Table to determine the patient's CHD Risk.        ATP III CLASSIFICATION (LDL):  <100     mg/dL   Optimal  100-129  mg/dL   Near or Above                    Optimal  130-159  mg/dL   Borderline  160-189  mg/dL   High  >190     mg/dL   Very High Performed at [facility] [address], [place], [place] [address]   Basic metabolic panel     Status: Abnormal   Collection Time: 04/13/18  5:07 AM  Result Value Ref Range   Sodium 140 135 - 145 mmol/L   Potassium 4.2 3.5 - 5.1 mmol/L   Chloride 104 98 - 111 mmol/L   CO2 27 22 - 32 mmol/L   Glucose, Bld 91 70 - 99 mg/dL   BUN 23 8 - 23 mg/dL   Creatinine, Ser 1.27 (H) 0.61 - 1.24 mg/dL   Calcium 9.2 8.9 - 10.3 mg/dL   GFR calc non Af Amer 50 (L) >60 mL/min   GFR calc Af Amer 59 (L) >60 mL/min    Comment: (NOTE) The eGFR has been calculated using the CKD EPI equation. This calculation has not been validated in all clinical situations. eGFR's persistently <60 mL/min signify possible Chronic Kidney Disease.    Anion gap 9 5 - 15    Comment: Performed at [facility] [address] [place], [place] [address]  Basic metabolic panel     Status: Abnormal   Collection Time: 04/14/18 11:09 AM  Result Value Ref Range   Sodium 137 135 - 145 mmol/L   Potassium 3.8 3.5 - 5.1 mmol/L   Chloride 101 98 - 111 mmol/L   CO2 27 22 - 32 mmol/L   Glucose, Bld 88 70 - 99 mg/dL   BUN 24 (H) 8 - 23 mg/dL   Creatinine, Ser 1.38 (H) 0.61 - 1.24 mg/dL   Calcium 8.9 8.9 - 10.3 mg/dL   GFR calc non Af Amer 46 (L) >60 mL/min   GFR calc Af Amer 53 (L) >60 mL/min    Comment: (NOTE) The eGFR has been calculated using the CKD EPI equation. This calculation has not been validated in all clinical situations. eGFR's  persistently <60 mL/min signify possible Chronic Kidney Disease.    Anion gap 9 5 -  15    Comment: Performed at [facility], [place] [address] [place], [place] [address]   Mr Brain 69 Contrast  Result Date: 04/13/2018 CLINICAL DATA:  84 y/o  M; left-sided weakness and slurred speech. EXAM: MRI HEAD WITHOUT CONTRAST TECHNIQUE: Multiplanar, multiecho pulse sequences of the brain and surrounding structures were obtained without intravenous contrast. COMPARISON:  04/12/2018 CT head, CTA head, CT perfusion head FINDINGS: Brain: 6 mm focus of reduced diffusion within the right central cerebral peduncle compatible with acute/early subacute infarction. No associated hemorrhage or mass effect. Tiny chronic lacunar infarct within the right putamen. Mild volume loss of the brain for age. No extra-axial collection, hydrocephalus, focal mass effect of the brain, or herniation. Vascular: Normal flow voids. Skull and upper cervical spine: Normal marrow signal. Sinuses/Orbits: Ethmoid sinus mucosal thickening and small mucous retention cysts within the maxillary sinuses. No abnormal signal of the mastoid air cells. Bilateral intra-ocular lens replacement. Other: None. IMPRESSION: 1. 6 mm acute/early subacute infarction within the right central cerebral peduncle. No associated hemorrhage or mass effect. 2. Mild volume loss of the brain for age. 3. Mild ethmoid and maxillary sinus disease. These results will be called to the ordering clinician or representative by the Radiologist Assistant, and communication documented in the PACS or zVision Dashboard. Electronically Signed   By: Kristine Garbe M.D.   On: 04/13/2018 04:19       Medical Problem List and Plan: 1.  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 2.  DVT Prophylaxis/Anticoagulation: Subcutaneous Lovenox.  Monitor for any bleeding episodes 3. Pain Management: Tylenol as needed 4. Mood: Provide emotional support 5.  Neuropsych: This patient is capable of making decisions on his own behalf. 6. Skin/Wound Care: Routine skin checks 7. Fluids/Electrolytes/Nutrition: Routine in and outs with follow-up chemistries 8.  Hypertension.  Permissive hypertension.  Benicar currently on hold. 9.  Hyperlipidemia.  Lipitor   Post Admission Physician Evaluation: 1. Preadmission assessment reviewed and changes made below. 2. Functional deficits secondary  to right central cerebral peduncle infarct. 3. Patient is admitted to receive collaborative, interdisciplinary care between the physiatrist, rehab nursing staff, and therapy team. 4. Patient's level of medical complexity and substantial therapy needs in context of that medical necessity cannot be provided at a lesser intensity of care such as a SNF. 5. Patient has experienced substantial functional loss from his/her baseline which was documented above under the "Functional History" and "Functional Status" headings.  Judging by the patient's diagnosis, physical exam, and functional history, the patient has potential for functional progress which will result in measurable gains while on inpatient rehab.  These gains will be of substantial and practical use upon discharge  in facilitating mobility and self-care at the household level. 56. Physiatrist will provide 24 hour management of medical needs as well as oversight of the therapy plan/treatment and provide guidance as appropriate regarding the interaction of the two. 7. 24 hour rehab nursing will assist with safety, disease management and patient education  and help integrate therapy concepts, techniques,education, etc. 8. PT will assess and treat for/with: Lower extremity strength, range of motion, stamina, balance, functional mobility, safety, adaptive techniques and equipment, coping skills, pain control, stroke education. Goals are: supervision/min assist. 9. OT will assess and treat for/with: ADL's, functional mobility,  safety, upper extremity strength, adaptive techniques and equipment, ego support, and community reintegration.   Goals are: supervision/min assist. Therapy may proceed with showering this patient. 10. Case Management and Social Worker will assess  and treat for psychological issues and discharge planning. 11. Team conference will be held weekly to assess progress toward goals and to determine barriers to discharge. 12. Patient will receive at least 3 hours of therapy per day at least 5 days per week. 13. ELOS: 9-13 days.       14. Prognosis:  good  I have personally performed a face to face diagnostic evaluation, including, but not limited to relevant history and physical exam findings, of this patient and developed relevant assessment and plan.  Additionally, I have reviewed and concur with the physician assistant's documentation above.  Delice Lesch, MD, ABPMR Lavon Paganini Angiulli, PA-C 04/14/2018

## 2018-04-14 NOTE — Progress Notes (Signed)
Patient discharged to 4W-15 per order via wheelchair. Wife at bedside. IV left in place and telemetry removed. Report called to Neoma Laming on 4W.

## 2018-04-14 NOTE — Progress Notes (Signed)
PMR Admission Coordinator Pre-Admission Assessment  Patient: Duane Park is an 84 y.o., male MRN: [id number] DOB: [birth year] Height: 5\' 11"  (180.3 cm) Weight: 93.2 kg (205 lb 7.5 oz)                                                                                                                                                  Insurance Information HMO:     PPO:      PCP:      IPA:      80/20:      OTHER:  PRIMARY: Medicare A & B      Policy#: [id number]      Subscriber: Self CM Name:       Phone#:      Fax#:  Pre-Cert#: Eligible       Employer: Retired  Benefits:  Phone #: Verified online      Name: [employer] Portal  Eff. Date: 04/11/99     Deduct: $1364      Out of Pocket Max: N/A      Life Max: N/A CIR: 100%      SNF: 100% days, 1-10; 80% days 21-100 Outpatient: 80%     Co-Pay: 20% Home Health: 100%      Co-Pay: $0 DME: 80%     Co-Pay: 20% Providers: Patient's Choice   SECONDARY: [employer]       Policy#: [id number]      Subscriber: Self CM Name:       Phone#:      Fax#:  Pre-Cert#:       Employer: Retired  Benefits:  Phone #: [phone]     Name:  Eff. 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 Presented 04/13/2018 with left-sided weakness. Per report, wife and patient, patient lives with spouse. Independent prior to  admission. 2 level home with bedroom on Main level and 3 steps to entry. Wife can assist as needed. There is a daughter in  the area that works. Cranial CT scan reviewed, unremarkable for acute intracranial process. CT angiogram of head and neck showed no emergent large vessel occlusion or stenosis. He did not receive TPA. MRI of the brain showed a 6 mm acute/early subacute infarction within the right central cerebralpeduncle. Echocardiogram with ejection fraction of 60% no wall motion abnormalities grade 1 diastolic dysfunction. Currently maintained on aspirin for CVA prophylaxis and subcutaneous Lovenox for DVT prophylaxis. He is tolerating a regular diet. Therapy evaluations have been completed with recommendations of physical medicine rehab consult. Patient was admitted for a comprehensive rehab program 04/14/18.  NIH Total: 5  Past Medical History      Past Medical History:  Diagnosis Date  . COPD (chronic obstructive pulmonary disease) ([place])   . Hypercholesteremia   . Hypertension     Family History  family history includes Heart disease in his father; Lung disease in his mother.  Prior Rehab/Hospitalizations:  Has the patient had major surgery during 100 days prior to admission? No  Current Medications   Current Facility-Administered Medications:  .  0.9 %  sodium chloride infusion, , Intravenous, Continuous, Oval Linsey, MD, Stopped at 04/13/18 1307 .  aspirin EC tablet 81 mg, 81 mg, Oral, Daily, Ledell Noss, MD .  atorvastatin (LIPITOR) tablet 80 mg, 80 mg, Oral, q1800, Ledell Noss, MD, 80 mg at 04/13/18 1803 .  chlorhexidine (PERIDEX) 0.12 % solution 15 mL, 15 mL, Mouth Rinse, BID, Oval Linsey, MD, 15 mL at 04/14/18 0808 .  enoxaparin (LOVENOX) injection 40 mg, 40 mg, Subcutaneous, Q24H, Ledell Noss, MD, 40 mg at 04/14/18 0539 .  MEDLINE mouth rinse, 15 mL, Mouth Rinse, q12n4p, Oval Linsey, MD, 15 mL at 04/14/18 1202 .  pantoprazole (PROTONIX) EC  tablet 80 mg, 80 mg, Oral, Daily, Ledell Noss, MD, 80 mg at 04/14/18 0808 .  psyllium (HYDROCIL/METAMUCIL) packet 1 packet, 1 packet, Oral, Daily, Ledell Noss, MD, 1 packet at 04/14/18 0807  Patients Current Diet:       Diet Order           Diet regular Room service appropriate? Yes; Fluid consistency: Thin  Diet effective now          Precautions / Restrictions Precautions Precautions: Fall Restrictions Weight Bearing Restrictions: No   Has the patient had 2 or more falls or a fall with injury in the past year?No  Prior Activity Level Community (5-7x/wk): Prior to admission patient was fully independent, driving, and active.    Home Assistive Devices / Equipment Home Assistive Devices/Equipment: None Home Equipment: Walker - 2 wheels, Bedside commode, Cane - single point  Prior Device Use: Indicate devices/aids used by the patient prior to current illness, exacerbation or injury? None of the above  Prior Functional Level Prior Function Level of Independence: Independent  Self Care: Did the patient need help bathing, dressing, using the toilet or eating? Independent  Indoor Mobility: Did the patient need assistance with walking from room to room (with or without device)? Independent  Stairs: Did the patient need assistance with internal or external stairs (with or without device)? Independent  Functional Cognition: Did the patient need help planning regular tasks such as shopping or remembering to take medications? Independent  Current Functional Level Cognition  Overall Cognitive Status: Within Functional Limits for tasks assessed Orientation Level: Oriented X4    Extremity Assessment (includes Sensation/Coordination)  Upper Extremity Assessment: LUE deficits/detail LUE Deficits / Details: 4-/5 LUE Sensation: decreased proprioception LUE Coordination: decreased fine motor, decreased gross motor  Lower Extremity Assessment: Defer to PT  evaluation LLE Deficits / Details: Grossly 3-/5 throughout. Functional weakness noted, as pt's L knee buckled during transfer to chair.  LLE Coordination: decreased gross motor, decreased fine motor    ADLs  Overall ADL's : Needs assistance/impaired Eating/Feeding: Set up, Sitting Grooming: Min guard, Standing, Wash/dry hands Upper Body Bathing: Minimal assistance, Sitting Lower Body Bathing: Minimal assistance, Sit to/from stand Upper Body Dressing : Set up, Sitting Lower Body Dressing: Minimal assistance, Sit to/from stand Lower Body Dressing Details (indicate cue type and reason): donned socks with increased time Toilet Transfer: Minimal assistance, Min guard, RW, Ambulation Toileting- Clothing Manipulation and Hygiene: Minimal assistance, Sit to/from stand Functional mobility during ADLs: Minimal assistance, Rolling walker    Mobility  Overal bed mobility: Needs Assistance Bed Mobility: Supine to Sit Supine to sit: Supervision General bed mobility comments: increased time, no assist, HOB up 30 degrees    Transfers  Overall transfer level: Needs assistance Equipment used: Rolling walker (2 wheeled) Transfers: Sit to/from Stand Sit to Stand: Min guard Stand pivot transfers: Mod assist General transfer comment: min guard for safety, cues for hand placement    Ambulation / Gait / Stairs / Wheelchair Mobility  Ambulation/Gait General Gait Details: NT     Posture / Balance Dynamic Sitting Balance Sitting balance - Comments: no LOB with donning socks Balance Overall balance assessment: Needs assistance Sitting-balance support: No upper extremity supported, Feet supported Sitting balance-Leahy Scale: Good Sitting balance - Comments: no LOB with donning socks Standing balance support: Bilateral upper extremity supported, During functional activity Standing balance-Leahy Scale: Poor Standing balance comment: Reliant on UE and external support.     Special needs/care  consideration BiPAP/CPAP: No CPM: No Continuous Drip IV: No Dialysis: No        Life Vest: No Oxygen: No Special Bed: No Trach Size: No Wound Vac (area): No       Skin: Dry                                Bowel mgmt: Continent, last BM 04/14/18 Bladder mgmt: Continent with urgency and assist needed to stand and use urinal about every 2 hours  Diabetic mgmt: No     Previous Home Environment Living Arrangements: Spouse/significant other Available Help at Discharge: Family, Available 24 hours/day Type of Home: House Home Layout: Two level, Able to live on main level with bedroom/bathroom Home Access: Stairs to enter Entrance Stairs-Rails: None Entrance Stairs-Number of Steps: 3 Bathroom Shower/Tub: Multimedia programmer: Handicapped height Home Care Services: No  Discharge Living Setting Plans for Discharge Living Setting: Patient's home, Lives with (comment)(Spouse ) Type of Home at Discharge: House Discharge Home Layout: Two level, Able to live on main level with bedroom/bathroom Alternate Level Stairs-Rails: Left Alternate Level Stairs-Number of Steps: 14 steps Discharge Home Access: Stairs to enter Entrance Stairs-Rails: (grab bars on each side ) Entrance Stairs-Number of Steps: 3 Discharge Bathroom Shower/Tub: Gaffer, Door(seat) Discharge Bathroom Toilet: Handicapped height Discharge Bathroom Accessibility: Yes How Accessible: Accessible via walker Does the patient have any problems obtaining your medications?: No  Social/Family/Support Systems Patient Roles: Spouse, Parent Contact Information: Spouse Anticipated Caregiver: Spouse Anticipated Caregiver's Contact Information: see above  Ability/Limitations of Caregiver: Supervision-Min A Caregiver Availability: 24/7 Discharge Plan Discussed with Primary Caregiver: Yes Is Caregiver In Agreement with Plan?: Yes Does Caregiver/Family have Issues with Lodging/Transportation while Pt is in Rehab?:  No  Goals/Additional  Needs Patient/Family Goal for Rehab: PT/OT: Supervision-Min A Expected length of stay: 9-13 days  Cultural Considerations: Baptist  Dietary Needs: Regular textures and thin liquids  Equipment Needs: TBD Pt/Family Agrees to Admission and willing to participate: Yes Program Orientation Provided & Reviewed with Pt/Caregiver Including Roles  & Responsibilities: Yes  Decrease burden of Care through IP rehab admission:  No   Possible need for SNF placement upon discharge: No  Patient Condition: This patient's condition remains as documented in the consult dated 04/14/18, in which the Rehabilitation Physician determined and documented that the patient's condition is appropriate for intensive rehabilitative care in an inpatient rehabilitation facility. Will admit to inpatient rehab today.  Preadmission Screen Completed By:  Gunnar Fusi, 04/14/2018 3:33 PM ______________________________________________________________________   Discussed status with Dr. Posey Pronto on 04/14/18 at 1545  and received telephone approval for admission today.  Admission Coordinator:  Gunnar Fusi, time 1545/Date 04/14/18             Cosigned by: Jamse Arn, MD at 04/14/2018 4:01 PM  Revision History

## 2018-04-14 NOTE — Progress Notes (Signed)
Physical Medicine and Rehabilitation Consult Reason for Consult: Left side weakness Referring Physician: [facility]   HPI: Duane Park is a 84 y.o. right-handed male with history of hypertension, hyperlipidemia.  Presented 04/13/2018 with left-sided weakness.  Per report, wife, and patient, patient lives with spouse.  Independent prior to admission.  2 level home with bedroom on Main and 3 steps to entry.  Wife can assist as needed.  Daughter in the area works.  Cranial CT scan reviewed, unremarkable for acute intracranial process. CT angiogram of head and neck showed no emergent large vessel occlusion or stenosis.  Patient did not receive TPA.  MRI of the brain showed 6 mm acute/early subacute infarction within the right central cerebral peduncle.  Echocardiogram with ejection fraction of 60% no wall motion abnormalities grade 1 diastolic dysfunction.  Currently maintained on aspirin for CVA prophylaxis.  Subcutaneous Lovenox for DVT prophylaxis.  Tolerating a regular diet.  Physical therapy evaluation completed with recommendations of physical medicine rehab consult.  Review of Systems  Constitutional: Negative for chills and fever.  HENT: Negative for hearing loss.   Eyes: Negative for blurred vision and double vision.  Respiratory: Negative for cough.        Mild shortness of breath with heavy exertion  Cardiovascular: Positive for leg swelling. Negative for chest pain and palpitations.  Gastrointestinal: Positive for constipation. Negative for nausea and vomiting.  Genitourinary: Negative for dysuria, flank pain and hematuria.  Musculoskeletal: Positive for myalgias.  Skin: Negative for rash.  Neurological: Positive for focal weakness.  All other systems reviewed and are negative.      Past Medical History:  Diagnosis Date  . COPD (chronic obstructive pulmonary disease) ([place])   . Hypercholesteremia   . Hypertension         Past Surgical History:  Procedure Laterality Date  .  BACK SURGERY          Family History  Problem Relation Age of Onset  . Lung disease Mother        never smoker  . Heart disease Father    Social History:  reports that he has never smoked. He has never used smokeless tobacco. He reports that he drank alcohol. He reports that he does not use drugs. Allergies: No Known Allergies       Medications Prior to Admission  Medication Sig Dispense Refill  . albuterol (PROAIR HFA) 108 (90 Base) MCG/ACT inhaler Inhale 2 puffs into the lungs every 6 (six) hours as needed for wheezing or shortness of breath.    . Carboxymethylcellulose Sodium (THERATEARS) 0.25 % SOLN Apply 1 drop to eye as needed (dry eyes).    . diphenhydrAMINE-Phenylephrine (DELSYM NIGHT TIME COUGH/COLD) 6.25-2.5 MG/5ML LIQD Take 5 mLs by mouth as needed (cough).    . famotidine (PEPCID) 20 MG tablet Take 20 mg by mouth daily.    . fluticasone (FLONASE) 50 MCG/ACT nasal spray Place 2 sprays into both nostrils daily at 12 noon.  2  . ipratropium-albuterol (DUONEB) 0.5-2.5 (3) MG/3ML SOLN Take 3 mLs by nebulization every 4 (four) hours as needed.    . Liniments (BLUE-EMU SUPER STRENGTH EX) Apply 1 application topically as needed (shoulder pain).    . mometasone-formoterol (DULERA) 100-5 MCG/ACT AERO Inhale 2 puffs into the lungs 2 (two) times daily. 1 Inhaler 11  . montelukast (SINGULAIR) 10 MG tablet Take 10 mg by mouth at bedtime.    Marland Kitchen olmesartan-hydrochlorothiazide (BENICAR HCT) 40-25 MG tablet Take 1 tablet by mouth daily.    Marland Kitchen  omeprazole (PRILOSEC) 40 MG capsule Take 40 mg by mouth daily.    Marland Kitchen Phytosterol Esters (CHOLEST CARE) 500 MG CAPS Take 1 Can by mouth daily.    . Psyllium (EQ DAILY FIBER PO) Take 1 capsule by mouth daily.    Marland Kitchen UNABLE TO FIND Med Name: Curamin OTC daily    . VIRTUSSIN A/C 100-10 MG/5ML syrup Take 5 mLs by mouth 4 (four) times daily as needed for cough.  0    Home: Home Living Family/patient expects to be discharged  to:: Private residence Living Arrangements: Spouse/significant other Available Help at Discharge: Family, Available 24 hours/day Type of Home: House Home Access: Stairs to enter [employer] of Steps: 3 Entrance Stairs-Rails: None Home Layout: Two level, Able to live on main level with bedroom/bathroom Bathroom Shower/Tub: Multimedia programmer: Handicapped height [place]: Environmental consultant - 2 wheels, Bedside commode, Cane - single point  Functional History: Prior Function Level of Independence: Independent Functional Status:  Mobility: Bed Mobility Overal bed mobility: Needs Assistance Bed Mobility: Supine to Sit Supine to sit: Min assist General bed mobility comments: Min A for steadying during trunk elevation. Increased time required.  Transfers Overall transfer level: Needs assistance Equipment used: 1 person hand held assist Transfers: Stand Pivot Transfers, Sit to/from Stand Sit to Stand: Mod assist Stand pivot transfers: Mod assist General transfer comment: Mod A for lift assist and steadying to stand. Required mod A for steadying during transfer to chair as pt's L knee would buckle with weight acceptance on LLE. PT manually blocked LLE during transfer to chair.  Ambulation/Gait General Gait Details: NT   ADL:  Cognition: Cognition Overall Cognitive Status: Within Functional Limits for tasks assessed Orientation Level: Oriented X4 Cognition Arousal/Alertness: Awake/alert Behavior During Therapy: WFL for tasks assessed/performed Overall Cognitive Status: Within Functional Limits for tasks assessed  Blood pressure 127/77, pulse 73, temperature 98.6 F (37 C), temperature source Oral, resp. rate 20, height 5\' 11"  (1.803 m), weight 93.2 kg (205 lb 7.5 oz), SpO2 97 %. Physical Exam  Vitals reviewed. Constitutional: He is oriented to person, place, and time. He appears well-developed and well-nourished.  HENT:  Head: Normocephalic and atraumatic.    Eyes: EOM are normal. Right eye exhibits no discharge. Left eye exhibits no discharge.  Neck: Normal range of motion. Neck supple. No thyromegaly present.  Cardiovascular: Normal rate, regular rhythm and normal heart sounds.  Respiratory: Effort normal and breath sounds normal. No respiratory distress.  GI: Soft. Bowel sounds are normal. He exhibits no distension.  Musculoskeletal:  No edema or tenderness in extremities  Neurological: He is alert and oriented to person, place, and time.  Mild left facial weakness Follows full commands.   Fair awareness of deficits Motor: Right upper extremity/right lower extremity: 4+/5 proximal distal Left upper extremity/left lower extremity: 4 -/5 proximal to distal (lower extremity with good upper extremity) Sensation intact light touch  Skin: Skin is warm and dry.  Psychiatric: He has a normal mood and affect. His behavior is normal.   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   Labs and images (see above) independently reviewed.  Records reviewed and summated above. Stroke: Continue secondary stroke prophylaxis and Risk Factor Modification listed below:   Antiplatelet therapy:   Blood Pressure Management:  Continue current medication with prn's with permisive HTN per primary team Statin Agent:   Left sided hemiparesis Motor recovery: Fluoxetine  1. Does the need for close, 24 hr/day medical supervision in concert with the patient's  rehab needs make it unreasonable for this patient to be served in a less intensive setting? Yes  2. Co-Morbidities requiring supervision/potential complications: diastolic dysfunction (monitor for signs/sypmtoms of fluid overload), HTN (monitor and provide prns in accordance with increased physical exertion and pain), hyperlipidemia (cont meds),  CKD (avoid nephrotoxic meds)  3. Due to safety, skin/wound care, disease management, pain management and patient education, does the  patient require 24 hr/day rehab nursing? Yes 4. Does the patient require coordinated care of a physician, rehab nurse, PT (1-2 hrs/day, 5 days/week) and OT (1-2 hrs/day, 5 days/week) to address physical and functional deficits in the context of the above medical diagnosis(es)? Yes Addressing deficits in the following areas: balance, endurance, locomotion, strength, transferring, bathing, dressing, toileting and psychosocial support 5. Can the patient actively participate in an intensive therapy program of at least 3 hrs of therapy per day at least 5 days per week? Yes 6. The potential for patient to make measurable gains while on inpatient rehab is excellent 7. Anticipated functional outcomes upon discharge from inpatient rehab are supervision and min assist  with PT, supervision and min assist with OT, n/a with SLP. 8. Estimated rehab length of stay to reach the above functional goals is: 9-13 days. 9. Anticipated D/C setting: Home 10. Anticipated post D/C treatments: HH therapy and Home excercise program 11. Overall Rehab/Functional Prognosis: good  RECOMMENDATIONS: This patient's condition is appropriate for continued rehabilitative care in the following setting: CIR Patient has agreed to participate in recommended program. Yes Note that insurance prior authorization may be required for reimbursement for recommended care.  Comment: Rehab Admissions Coordinator to follow up.   I have personally performed a face to face diagnostic evaluation, including, but not limited to relevant history and physical exam findings, of this patient and developed relevant assessment and plan.  Additionally, I have reviewed and concur with the physician assistant's documentation above.   Delice Lesch, MD, ABPMR Lavon Paganini Angiulli, PA-C 04/14/2018          Revision History                        Routing History

## 2018-04-14 NOTE — Evaluation (Signed)
Occupational Therapy Evaluation Patient Details Name: Duane Park MRN: [id number] DOB: July 05, 1932 Today's Date: 04/14/2018    History of Present Illness Pt is an 84 y/o male admitted secondary to L sided weakness. Imaging revealed acute/subacute R central cerebral peduncal infarct. PMH includes HTN and COPD.    Clinical Impression   Pt is typically independent. Presents with L side weakness and incoordination, impaired standing balance and intermittent diplopia. Pt requires min assist for OOB mobility and ADL. He will progress well with intensive rehab and is highly motivated to return to driving. Will follow acutely.    Follow Up Recommendations  CIR    Equipment Recommendations       Recommendations for Other Services       Precautions / Restrictions Precautions Precautions: Fall Restrictions Weight Bearing Restrictions: No      Mobility Bed Mobility Overal bed mobility: Needs Assistance Bed Mobility: Supine to Sit     Supine to sit: Supervision     General bed mobility comments: increased time, no assist, HOB up 30 degrees  Transfers Overall transfer level: Needs assistance Equipment used: Rolling walker (2 wheeled) Transfers: Sit to/from Stand Sit to Stand: Min guard         General transfer comment: min guard for safety, cues for hand placement    Balance Overall balance assessment: Needs assistance   Sitting balance-Leahy Scale: Good Sitting balance - Comments: no LOB with donning socks     Standing balance-Leahy Scale: Poor Standing balance comment: Reliant on UE and external support.                            ADL either performed or assessed with clinical judgement   ADL Overall ADL's : Needs assistance/impaired Eating/Feeding: Set up;Sitting   Grooming: Min guard;Standing;Wash/dry hands   Upper Body Bathing: Minimal assistance;Sitting   Lower Body Bathing: Minimal assistance;Sit to/from stand   Upper Body Dressing : Set  up;Sitting   Lower Body Dressing: Minimal assistance;Sit to/from stand Lower Body Dressing Details (indicate cue type and reason): donned socks with increased time Toilet Transfer: Minimal assistance;Min guard;RW;Ambulation   Toileting- Clothing Manipulation and Hygiene: Minimal assistance;Sit to/from stand       Functional mobility during ADLs: Minimal assistance;Rolling walker       Vision Patient Visual Report: Diplopia Vision Assessment?: Yes Eye Alignment: Within Functional Limits Ocular Range of Motion: Within Functional Limits Alignment/Gaze Preference: Within Defined Limits Tracking/Visual Pursuits: Decreased smoothness of horizontal tracking;Decreased smoothness of vertical tracking Saccades: Decreased speed of saccadic movement Convergence: Impaired (comment) Visual Fields: No apparent deficits Diplopia Assessment: Disappears with one eye closed;Objects split on top of one another;Present in far gaze Additional Comments: intermittent diplopia     Perception     Praxis      Pertinent Vitals/Pain Pain Assessment: No/denies pain     Hand Dominance Right   Extremity/Trunk Assessment Upper Extremity Assessment Upper Extremity Assessment: LUE deficits/detail LUE Deficits / Details: 4-/5 LUE Sensation: decreased proprioception LUE Coordination: decreased fine motor;decreased gross motor   Lower Extremity Assessment Lower Extremity Assessment: Defer to PT evaluation   Cervical / Trunk Assessment Cervical / Trunk Assessment: Kyphotic   Communication Communication Communication: No difficulties   Cognition Arousal/Alertness: Awake/alert Behavior During Therapy: WFL for tasks assessed/performed Overall Cognitive Status: Within Functional Limits for tasks assessed  General Comments       Exercises     Shoulder Instructions      Home Living Family/patient expects to be discharged to:: Private  residence Living Arrangements: Spouse/significant other Available Help at Discharge: Family;Available 24 hours/day Type of Home: House Home Access: Stairs to enter [employer] of Steps: 3 Entrance Stairs-Rails: None Home Layout: Two level;Able to live on main level with bedroom/bathroom     Bathroom Shower/Tub: Walk-in shower   Bathroom Toilet: Handicapped height     Home Equipment: Environmental consultant - 2 wheels;Bedside commode;Cane - single point          Prior Functioning/Environment Level of Independence: Independent                 OT Problem List: Impaired balance (sitting and/or standing);Decreased coordination;Impaired vision/perception;Decreased strength;Decreased knowledge of use of DME or AE;Impaired UE functional use      OT Treatment/Interventions: Self-care/ADL training;DME and/or AE instruction;Neuromuscular education;Balance training;Patient/family education;Therapeutic activities    OT Goals(Current goals can be found in the care plan section) Acute Rehab OT Goals Patient Stated Goal: to get stronger OT Goal Formulation: With patient Time For Goal Achievement: 04/28/18 Potential to Achieve Goals: Good ADL Goals Pt Will Perform Grooming: with supervision;standing Pt Will Perform Upper Body Dressing: with set-up;sitting Pt Will Perform Lower Body Dressing: with supervision;sit to/from stand Pt Will Transfer to Toilet: with supervision;ambulating Pt Will Perform Toileting - Clothing Manipulation and hygiene: with supervision;sit to/from stand Pt Will Perform Tub/Shower Transfer: with supervision;ambulating;rolling walker Pt/caregiver will Perform Home Exercise Program: Increased strength;Left upper extremity;With theraputty;With theraband;Independently;With written HEP provided  OT Frequency: Min 3X/week   Barriers to D/C:            Co-evaluation              AM-PAC PT "6 Clicks" Daily Activity     Outcome Measure Help from another person  eating meals?: A Little Help from another person taking care of personal grooming?: A Little Help from another person toileting, which includes using toliet, bedpan, or urinal?: A Little Help from another person bathing (including washing, rinsing, drying)?: A Little Help from another person to put on and taking off regular upper body clothing?: None Help from another person to put on and taking off regular lower body clothing?: A Little 6 Click Score: 19   End of Session Equipment Utilized During Treatment: Gait belt;Rolling walker  Activity Tolerance: Patient tolerated treatment well Patient left: in bed;with call bell/phone within reach;with family/visitor present  OT Visit Diagnosis: Unsteadiness on feet (R26.81);Other abnormalities of gait and mobility (R26.89);Hemiplegia and hemiparesis;Muscle weakness (generalized) (M62.81) Hemiplegia - Right/Left: Left Hemiplegia - dominant/non-dominant: Non-Dominant Hemiplegia - caused by: Cerebral infarction                Time: 9528-4132 OT Time Calculation (min): 37 min Charges:  OT General Charges $OT Visit: 1 Visit OT Evaluation $OT Eval Moderate Complexity: 1 Mod OT Treatments $Self Care/Home Management : 8-22 mins  Malka So 04/14/2018, 12:11 PM  04/14/2018 Nestor Lewandowsky, OTR/L Pager: [phone]

## 2018-04-14 NOTE — Progress Notes (Signed)
Inpatient Rehabilitation  Met with patient and spouse at bedside to discuss team's recommendation for IP Rehab.  Shared booklets, insurance verification letter, and answered questions.  Patient is eager to participate in program to regain his independence.  I have medical clearance and a bed to offer.  Plan to proceed with IP Rehab admission.  Updated team.  Call if questions.  Carmelia Roller., CCC/SLP Admission Coordinator  [place]  Cell [phone]

## 2018-04-14 NOTE — Progress Notes (Signed)
   Subjective: Patient reports no acute events overnight. He does have chronic shoulder and back pain and her received tylenol for that. Denies any chest pain, SOB, or cough today. Slept well. He states that he feels like he has been getting stronger. We discussed with him the next step is to be evaluated for CIR and he is in agreement with the plan.   Objective:  Vital signs in last 24 hours: Vitals:   04/13/18 0501 04/13/18 1417 04/13/18 2229 04/14/18 0427  BP: 135/81 135/74 122/64 127/77  Pulse: 68 76 70 73  Resp: 18 16 18 20   Temp: (!) 97.5 F (36.4 C) 98 F (36.7 C) 98 F (36.7 C) 98.6 F (37 C)  TempSrc: Oral Oral Oral Oral  SpO2: 95% 94% 95% 97%  Weight:      Height:       General: Well nourished, well appearing, no acute distress HEENT: Normocephalic, atraumatic Cardiac: RRR, no murmurs, rubs or gallops Pulmonary: Lungs CTA bilaterally, no wheezing, rhonchi or rale Abdomen: Soft, non-tender, +bowel sounds, no masses Extremity: No LE edema, no trauma, no rashes noted Neuro: Alert and oriented x3,  PERRLA, mild left facial droop, mild left upper extremity dysmetria,  4/5 strength in left upper and lower extremity, 2+ reflexes in upper and lower extremities,  Psychiatry: Normal mood and affect   Assessment/Plan: This is a 84 year old male with a PMG of HTN, HLD, and GERD who presented with a stumbling gait, left sided weakness and a left facial droop. Admitted for a code stroke.    CVA: Patient presented with with left sided facial droop, and left sided weakness. His initial CT was negative. CTA head and neck showed no emergent large vessel occlusion, no significant arterial stenosis, mild cervical carotid artery atherosclerosis, mild proximal right vertebral artery stenosis and hypoplastic left vertebral artery. Neurology evaluated,no TPA given due to himbeing outside the window.Follow up MRI showed  a 82mm infarct in right cerebral peduncle. He was found to be  hypertensive on admission with left facial droop,4/5 leftupper and lower extremity strength,mild dysmetria on finger-to-nose test.He was started on atorvastatin, ASA. Patients strength has been improving while he has been here with improvement in his dysmetria. PT evaluated and recommended CIR evaluation.  -Echocardiogram: EF 22-63%, grade 1 diastolic dysfunction, no regional wall abnormality -Lipids: Cholesterol: 188, HDL 20, LDL 126, triglycerides 160 -Hgb A1c 5.3 -Continue atorvastatin 80mg  daily -ASA 325mg  daily -Approved for CIR, possible d/c today  Chronic cough: Denies any symptoms of cough today. Planned to follow up with pulmonology in 3 weeks for PFTs regarding this. Possibly related to PND vs GERD vs bronchitis.  -Increased dose of omeprazole, continue on discharge -Duonebs PRN  GERD: On omeprazole at home. Increased dose to 80 mg here to possibly improves patients cough. HTN: Patient on benicar at home. Has been normotensive during hospitalization. Holding home meds.   FEN: No fluids, replete lytes prn, regular diet  VTE ppx: Lovenox  Code Status: DNR     Dispo: Anticipated discharge in approximately 1 day(s).   Asencion Noble, MD 04/14/2018, 6:42 AM Pager: [phone]

## 2018-04-14 NOTE — Progress Notes (Signed)
Subjective: Duane Park was upright on the edge of his bed and looking much better today. He reported sleeping well and feeling as though he was improving. He suffered from a brief episode of shoulder and back pain last night that resolved with tylenol, which he feels is chronic. We discussed his care plan moving forward and his likely next steps of inpatient rehab. He had no questions when the care team left.   Objective: Vital signs in last 24 hours: Vitals:   04/13/18 0501 04/13/18 1417 04/13/18 2229 04/14/18 0427  BP: 135/81 135/74 122/64 127/77  Pulse: 68 76 70 73  Resp: 18 16 18 20   Temp: (!) 97.5 F (36.4 C) 98 F (36.7 C) 98 F (36.7 C) 98.6 F (37 C)  TempSrc: Oral Oral Oral Oral  SpO2: 95% 94% 95% 97%  Weight:      Height:        Intake/Output Summary (Last 24 hours) at 04/14/2018 1220 Last data filed at 04/14/2018 6045 Gross per 24 hour  Intake 121.13 ml  Output 375 ml  Net -253.87 ml    General: Well nourished, well appearing, no acute distress HEENT: Normocephalic, atraumatic Extremity: No LE edema, no trauma, no rashes noted Neuro: Alert and oriented x3,  mild left facial droop, mild left upper extremity dysmetria, 2+ reflexes in upper and lower extremities,  Psychiatry: Normal mood and affect   Lab Results: Results for orders placed or performed during the hospital encounter of 04/12/18 (from the past 24 hour(s))  Basic metabolic panel     Status: Abnormal   Collection Time: 04/14/18 11:09 AM  Result Value Ref Range   Sodium 137 135 - 145 mmol/L   Potassium 3.8 3.5 - 5.1 mmol/L   Chloride 101 98 - 111 mmol/L   CO2 27 22 - 32 mmol/L   Glucose, Bld 88 70 - 99 mg/dL   BUN 24 (H) 8 - 23 mg/dL   Creatinine, Ser 1.38 (H) 0.61 - 1.24 mg/dL   Calcium 8.9 8.9 - 10.3 mg/dL   GFR calc non Af Amer 46 (L) >60 mL/min   GFR calc Af Amer 53 (L) >60 mL/min   Anion gap 9 5 - 15    Micro Results: No results found for this or any previous visit (from the past 240  hour(s)). Studies/Results: Mr Brain Wo Contrast  Result Date: 04/13/2018 CLINICAL DATA:  84 y/o  M; left-sided weakness and slurred speech. EXAM: MRI HEAD WITHOUT CONTRAST TECHNIQUE: Multiplanar, multiecho pulse sequences of the brain and surrounding structures were obtained without intravenous contrast. COMPARISON:  04/12/2018 CT head, CTA head, CT perfusion head FINDINGS: Brain: 6 mm focus of reduced diffusion within the right central cerebral peduncle compatible with acute/early subacute infarction. No associated hemorrhage or mass effect. Tiny chronic lacunar infarct within the right putamen. Mild volume loss of the brain for age. No extra-axial collection, hydrocephalus, focal mass effect of the brain, or herniation. Vascular: Normal flow voids. Skull and upper cervical spine: Normal marrow signal. Sinuses/Orbits: Ethmoid sinus mucosal thickening and small mucous retention cysts within the maxillary sinuses. No abnormal signal of the mastoid air cells. Bilateral intra-ocular lens replacement. Other: None. IMPRESSION: 1. 6 mm acute/early subacute infarction within the right central cerebral peduncle. No associated hemorrhage or mass effect. 2. Mild volume loss of the brain for age. 3. Mild ethmoid and maxillary sinus disease. These results will be called to the ordering clinician or representative by the Radiologist Assistant, and communication documented in the PACS or  [facility] Dashboard. Electronically Signed   By: Kristine Garbe M.D.   On: 04/13/2018 04:19   Medications: I have reviewed the patient's current medications. Scheduled Meds: . aspirin EC  81 mg Oral Daily  . atorvastatin  80 mg Oral q1800  . chlorhexidine  15 mL Mouth Rinse BID  . enoxaparin (LOVENOX) injection  40 mg Subcutaneous Q24H  . mouth rinse  15 mL Mouth Rinse q12n4p  . pantoprazole  80 mg Oral Daily  . psyllium  1 packet Oral Daily   Continuous Infusions: . sodium chloride Stopped (04/13/18 1307)   PRN  Meds:. Assessment/Plan: Duane Park is an 84 y/o male with a medical history relevant for HTN and HLD who presented to the ER with approximately 15 hours of left sided weakness, facial droop, and dysarthria. His clinical picture and exam findings are strongly suggestive of an CVA.    # CVA Clinical picture is strongly suggestive of a CVA. Patient presented outside of the window for TPA. CT unremarkable, MRI shows 80mm right cerebral peduncle infarct. Echo shows EF 19-41%, grade 1 diastolic dysfunction and no regional wall abnormality. Strength improving. Evaluation by CIR today. A1c 5.3.  - CT head, unremarkable  - CTA neck, unrevealing of cause  - MRI 87mm infarct in R cerebral peduncle  - Asprin 325mg  qd  - Atorvastatin 80mg  qd - PT/OT consult  - Neurology following  Chronic Problems # GERD - Home omeprazole increases to 80mg  dose, Pepcid prn # Osteoarthritis (back and knees) - holding home curamin # Chronic Cough  Asthma  Unclear etiology. Ongoing issue since January of 2019. Patient concerned that previously prescribed inhalers have caused geographical tongue. 4x visits to urgent care in 8 months for similar symptoms. F/u scheduled with pulm for PFTs AUG 27th.  - hold ipatroprium/albuterol, Dulera, montelukast in setting of apparent adverse reaction and no symptoms - continue to monitor  # HTN: Patient on benicar at home. Holding currently.    This is a Careers information officer Note.  The care of the patient was discussed with Dr. Hetty Ely and the assessment and plan formulated with their assistance.  Please see their attached note for official documentation of the daily encounter.   LOS: 2 days   Duane Park, Medical Student 04/14/2018, 12:20 PM

## 2018-04-14 NOTE — Consult Note (Signed)
Physical Medicine and Rehabilitation Consult Reason for Consult: Left side weakness Referring Physician: [facility]   HPI: Duane Park is a 84 y.o. right-handed male with history of hypertension, hyperlipidemia.  Presented 04/13/2018 with left-sided weakness.  Per report, wife, and patient, patient lives with spouse.  Independent prior to admission.  2 level home with bedroom on [address] and 3 steps to entry.  Wife can assist as needed.  Daughter in the area works.  Cranial CT scan reviewed, unremarkable for acute intracranial process. CT angiogram of head and neck showed no emergent large vessel occlusion or stenosis.  Patient did not receive TPA.  MRI of the brain showed 6 mm acute/early subacute infarction within the right central cerebral peduncle.  Echocardiogram with ejection fraction of 60% no wall motion abnormalities grade 1 diastolic dysfunction.  Currently maintained on aspirin for CVA prophylaxis.  Subcutaneous Lovenox for DVT prophylaxis.  Tolerating a regular diet.  Physical therapy evaluation completed with recommendations of physical medicine rehab consult.  Review of Systems  Constitutional: Negative for chills and fever.  HENT: Negative for hearing loss.   Eyes: Negative for blurred vision and double vision.  Respiratory: Negative for cough.        Mild shortness of breath with heavy exertion  Cardiovascular: Positive for leg swelling. Negative for chest pain and palpitations.  Gastrointestinal: Positive for constipation. Negative for nausea and vomiting.  Genitourinary: Negative for dysuria, flank pain and hematuria.  Musculoskeletal: Positive for myalgias.  Skin: Negative for rash.  Neurological: Positive for focal weakness.  All other systems reviewed and are negative.  Past Medical History:  Diagnosis Date  . COPD (chronic obstructive pulmonary disease) ([place])   . Hypercholesteremia   . Hypertension    Past Surgical History:  Procedure Laterality Date  . BACK  SURGERY     Family History  Problem Relation Age of Onset  . Lung disease Mother        never smoker  . Heart disease Father    Social History:  reports that he has never smoked. He has never used smokeless tobacco. He reports that he drank alcohol. He reports that he does not use drugs. Allergies: No Known Allergies Medications Prior to Admission  Medication Sig Dispense Refill  . albuterol (PROAIR HFA) 108 (90 Base) MCG/ACT inhaler Inhale 2 puffs into the lungs every 6 (six) hours as needed for wheezing or shortness of breath.    . Carboxymethylcellulose Sodium (THERATEARS) 0.25 % SOLN Apply 1 drop to eye as needed (dry eyes).    . diphenhydrAMINE-Phenylephrine (DELSYM NIGHT TIME COUGH/COLD) 6.25-2.5 MG/5ML LIQD Take 5 mLs by mouth as needed (cough).    . famotidine (PEPCID) 20 MG tablet Take 20 mg by mouth daily.    . fluticasone (FLONASE) 50 MCG/ACT nasal spray Place 2 sprays into both nostrils daily at 12 noon.  2  . ipratropium-albuterol (DUONEB) 0.5-2.5 (3) MG/3ML SOLN Take 3 mLs by nebulization every 4 (four) hours as needed.    . Liniments (BLUE-EMU SUPER STRENGTH EX) Apply 1 application topically as needed (shoulder pain).    . mometasone-formoterol (DULERA) 100-5 MCG/ACT AERO Inhale 2 puffs into the lungs 2 (two) times daily. 1 Inhaler 11  . montelukast (SINGULAIR) 10 MG tablet Take 10 mg by mouth at bedtime.    Marland Kitchen olmesartan-hydrochlorothiazide (BENICAR HCT) 40-25 MG tablet Take 1 tablet by mouth daily.    Marland Kitchen omeprazole (PRILOSEC) 40 MG capsule Take 40 mg by mouth daily.    Marland Kitchen  Phytosterol Esters (CHOLEST CARE) 500 MG CAPS Take 1 Can by mouth daily.    . Psyllium (EQ DAILY FIBER PO) Take 1 capsule by mouth daily.    Marland Kitchen UNABLE TO FIND Med Name: Curamin OTC daily    . VIRTUSSIN A/C 100-10 MG/5ML syrup Take 5 mLs by mouth 4 (four) times daily as needed for cough.  0    Home: Home Living Family/patient expects to be discharged to:: Private residence Living Arrangements:  Spouse/significant other Available Help at Discharge: Family, Available 24 hours/day Type of Home: House Home Access: Stairs to enter [employer] of Steps: 3 Entrance Stairs-Rails: None Home Layout: Two level, Able to live on main level with bedroom/bathroom Bathroom Shower/Tub: Multimedia programmer: Handicapped height [place]: Environmental consultant - 2 wheels, Bedside commode, Cane - single point  Functional History: Prior Function Level of Independence: Independent Functional Status:  Mobility: Bed Mobility Overal bed mobility: Needs Assistance Bed Mobility: Supine to Sit Supine to sit: Min assist General bed mobility comments: Min A for steadying during trunk elevation. Increased time required.  Transfers Overall transfer level: Needs assistance Equipment used: 1 person hand held assist Transfers: Stand Pivot Transfers, Sit to/from Stand Sit to Stand: Mod assist Stand pivot transfers: Mod assist General transfer comment: Mod A for lift assist and steadying to stand. Required mod A for steadying during transfer to chair as pt's L knee would buckle with weight acceptance on LLE. PT manually blocked LLE during transfer to chair.  Ambulation/Gait General Gait Details: NT     ADL:    Cognition: Cognition Overall Cognitive Status: Within Functional Limits for tasks assessed Orientation Level: Oriented X4 Cognition Arousal/Alertness: Awake/alert Behavior During Therapy: WFL for tasks assessed/performed Overall Cognitive Status: Within Functional Limits for tasks assessed  Blood pressure 127/77, pulse 73, temperature 98.6 F (37 C), temperature source Oral, resp. rate 20, height 5\' 11"  (1.803 m), weight 93.2 kg (205 lb 7.5 oz), SpO2 97 %. Physical Exam  Vitals reviewed. Constitutional: He is oriented to person, place, and time. He appears well-developed and well-nourished.  HENT:  Head: Normocephalic and atraumatic.  Eyes: EOM are normal. Right eye exhibits  no discharge. Left eye exhibits no discharge.  Neck: Normal range of motion. Neck supple. No thyromegaly present.  Cardiovascular: Normal rate, regular rhythm and normal heart sounds.  Respiratory: Effort normal and breath sounds normal. No respiratory distress.  GI: Soft. Bowel sounds are normal. He exhibits no distension.  Musculoskeletal:  No edema or tenderness in extremities  Neurological: He is alert and oriented to person, place, and time.  Mild left facial weakness Follows full commands.   Fair awareness of deficits Motor: Right upper extremity/right lower extremity: 4+/5 proximal distal Left upper extremity/left lower extremity: 4 -/5 proximal to distal (lower extremity with good upper extremity) Sensation intact light touch  Skin: Skin is warm and dry.  Psychiatric: He has a normal mood and affect. His behavior is normal.    No results found for this or any previous visit (from the past 24 hour(s)). Ct Angio Head [place] Contrast  Result Date: 04/12/2018 CLINICAL DATA:  Left-sided weakness, left facial droop, and slurred speech. EXAM: CT ANGIOGRAPHY HEAD AND NECK CT PERFUSION BRAIN TECHNIQUE: Multidetector CT imaging of the head and neck was performed using the standard protocol during bolus administration of intravenous contrast. Multiplanar CT image reconstructions and MIPs were obtained to evaluate the vascular anatomy. Carotid stenosis measurements (when applicable) are obtained utilizing NASCET criteria, using the distal internal  carotid diameter as the denominator. Multiphase CT imaging of the brain was performed following IV bolus contrast injection. Subsequent parametric perfusion maps were calculated using RAPID software. CONTRAST:  88mL ISOVUE-370 IOPAMIDOL (ISOVUE-370) INJECTION 76% COMPARISON:  Noncontrast head CT earlier today. Brain MRI 01/18/2014. No prior angiographic imaging. FINDINGS: CTA NECK FINDINGS Aortic arch: Normal variant aortic arch branching pattern with the  left vertebral artery arising directly from the arch. Mild arch atherosclerosis. Calcified plaque at the left subclavian artery origin results in less than 50% stenosis. Right carotid system: Patent without evidence of stenosis or dissection. Mild plaque about the carotid bifurcation. Left carotid system: Patent without evidence of stenosis or dissection. Calcified plaque about the carotid bifurcation, slightly greater than on the left. Vertebral arteries: The vertebral arteries are patent with the right being strongly dominant. Focal calcified plaque in the proximal right V1 segment results in mild stenosis. The proximal left V1 segment is suboptimally evaluated due to motion artifact in the upper mediastinum and the small size of the vessel. Skeleton: Moderate to severe cervical disc and facet degeneration. Grade 1 anterolisthesis of C4 on C5. Other neck: No evidence of acute abnormality or mass. Upper chest: Mild motion artifact and subpleural opacity which may reflect atelectasis. Review of the MIP images confirms the above findings CTA HEAD FINDINGS Anterior circulation: The internal carotid arteries are widely patent from skull base to carotid termini without significant siphon atherosclerosis. The ACAs and MCAs are patent without evidence of proximal branch occlusion or significant proximal stenosis. There is some asymmetric attenuation of distal left MCA branch vessels in the temporoparietal region. No aneurysm is identified. Posterior circulation: The intracranial vertebral arteries are patent to the basilar with the left being diminutive distal to the PICA origin. Patent PICA and SCA origins are visualized bilaterally. The basilar artery is widely patent. There are large posterior communicating arteries with diminutive or absent P1 segments bilaterally. There is mild distal right posterior communicating/proximal P2 stenosis. No aneurysm is identified. Venous sinuses: As permitted by contrast timing,  patent. Anatomic variants: Fetal origins of the PCAs. Review of the MIP images confirms the above findings CT Brain Perfusion Findings: CBF (<30%) Volume: 0 mL Perfusion (Tmax>6.0s) volume: 0 mL Mismatch Volume: n/a Infarction Location: n/a IMPRESSION: 1. No emergent large vessel occlusion. 2. No significant proximal intracranial arterial stenosis. 3. Mild cervical carotid artery atherosclerosis without stenosis. 4. Mild proximal right vertebral artery stenosis. Hypoplastic left vertebral artery. Electronically Signed   By: Logan Bores M.D.   On: 04/12/2018 07:59   Ct Angio Neck [place] Contrast  Result Date: 04/12/2018 CLINICAL DATA:  Left-sided weakness, left facial droop, and slurred speech. EXAM: CT ANGIOGRAPHY HEAD AND NECK CT PERFUSION BRAIN TECHNIQUE: Multidetector CT imaging of the head and neck was performed using the standard protocol during bolus administration of intravenous contrast. Multiplanar CT image reconstructions and MIPs were obtained to evaluate the vascular anatomy. Carotid stenosis measurements (when applicable) are obtained utilizing NASCET criteria, using the distal internal carotid diameter as the denominator. Multiphase CT imaging of the brain was performed following IV bolus contrast injection. Subsequent parametric perfusion maps were calculated using RAPID software. CONTRAST:  25mL ISOVUE-370 IOPAMIDOL (ISOVUE-370) INJECTION 76% COMPARISON:  Noncontrast head CT earlier today. Brain MRI 01/18/2014. No prior angiographic imaging. FINDINGS: CTA NECK FINDINGS Aortic arch: Normal variant aortic arch branching pattern with the left vertebral artery arising directly from the arch. Mild arch atherosclerosis. Calcified plaque at the left subclavian artery origin results in less than 50%  stenosis. Right carotid system: Patent without evidence of stenosis or dissection. Mild plaque about the carotid bifurcation. Left carotid system: Patent without evidence of stenosis or dissection. Calcified  plaque about the carotid bifurcation, slightly greater than on the left. Vertebral arteries: The vertebral arteries are patent with the right being strongly dominant. Focal calcified plaque in the proximal right V1 segment results in mild stenosis. The proximal left V1 segment is suboptimally evaluated due to motion artifact in the upper mediastinum and the small size of the vessel. Skeleton: Moderate to severe cervical disc and facet degeneration. Grade 1 anterolisthesis of C4 on C5. Other neck: No evidence of acute abnormality or mass. Upper chest: Mild motion artifact and subpleural opacity which may reflect atelectasis. Review of the MIP images confirms the above findings CTA HEAD FINDINGS Anterior circulation: The internal carotid arteries are widely patent from skull base to carotid termini without significant siphon atherosclerosis. The ACAs and MCAs are patent without evidence of proximal branch occlusion or significant proximal stenosis. There is some asymmetric attenuation of distal left MCA branch vessels in the temporoparietal region. No aneurysm is identified. Posterior circulation: The intracranial vertebral arteries are patent to the basilar with the left being diminutive distal to the PICA origin. Patent PICA and SCA origins are visualized bilaterally. The basilar artery is widely patent. There are large posterior communicating arteries with diminutive or absent P1 segments bilaterally. There is mild distal right posterior communicating/proximal P2 stenosis. No aneurysm is identified. Venous sinuses: As permitted by contrast timing, patent. Anatomic variants: Fetal origins of the PCAs. Review of the MIP images confirms the above findings CT Brain Perfusion Findings: CBF (<30%) Volume: 0 mL Perfusion (Tmax>6.0s) volume: 0 mL Mismatch Volume: n/a Infarction Location: n/a IMPRESSION: 1. No emergent large vessel occlusion. 2. No significant proximal intracranial arterial stenosis. 3. Mild cervical  carotid artery atherosclerosis without stenosis. 4. Mild proximal right vertebral artery stenosis. Hypoplastic left vertebral artery. Electronically Signed   By: Logan Bores M.D.   On: 04/12/2018 07:59   [place] Contrast  Result Date: 04/13/2018 CLINICAL DATA:  84 y/o  M; left-sided weakness and slurred speech. EXAM: MRI HEAD WITHOUT CONTRAST TECHNIQUE: Multiplanar, multiecho pulse sequences of the brain and surrounding structures were obtained without intravenous contrast. COMPARISON:  04/12/2018 CT head, CTA head, CT perfusion head FINDINGS: Brain: 6 mm focus of reduced diffusion within the right central cerebral peduncle compatible with acute/early subacute infarction. No associated hemorrhage or mass effect. Tiny chronic lacunar infarct within the right putamen. Mild volume loss of the brain for age. No extra-axial collection, hydrocephalus, focal mass effect of the brain, or herniation. Vascular: Normal flow voids. Skull and upper cervical spine: Normal marrow signal. Sinuses/Orbits: Ethmoid sinus mucosal thickening and small mucous retention cysts within the maxillary sinuses. No abnormal signal of the mastoid air cells. Bilateral intra-ocular lens replacement. Other: None. IMPRESSION: 1. 6 mm acute/early subacute infarction within the right central cerebral peduncle. No associated hemorrhage or mass effect. 2. Mild volume loss of the brain for age. 3. Mild ethmoid and maxillary sinus disease. These results will be called to the ordering clinician or representative by the Radiologist Assistant, and communication documented in the PACS or zVision Dashboard. Electronically Signed   By: Kristine Garbe M.D.   On: 04/13/2018 04:19   Ct Cerebral Perfusion W Contrast  Result Date: 04/12/2018 CLINICAL DATA:  Left-sided weakness, left facial droop, and slurred speech. EXAM: CT ANGIOGRAPHY HEAD AND NECK CT PERFUSION BRAIN TECHNIQUE: Multidetector CT imaging of  the head and neck was performed using  the standard protocol during bolus administration of intravenous contrast. Multiplanar CT image reconstructions and MIPs were obtained to evaluate the vascular anatomy. Carotid stenosis measurements (when applicable) are obtained utilizing NASCET criteria, using the distal internal carotid diameter as the denominator. Multiphase CT imaging of the brain was performed following IV bolus contrast injection. Subsequent parametric perfusion maps were calculated using RAPID software. CONTRAST:  22mL ISOVUE-370 IOPAMIDOL (ISOVUE-370) INJECTION 76% COMPARISON:  Noncontrast head CT earlier today. Brain MRI 01/18/2014. No prior angiographic imaging. FINDINGS: CTA NECK FINDINGS Aortic arch: Normal variant aortic arch branching pattern with the left vertebral artery arising directly from the arch. Mild arch atherosclerosis. Calcified plaque at the left subclavian artery origin results in less than 50% stenosis. Right carotid system: Patent without evidence of stenosis or dissection. Mild plaque about the carotid bifurcation. Left carotid system: Patent without evidence of stenosis or dissection. Calcified plaque about the carotid bifurcation, slightly greater than on the left. Vertebral arteries: The vertebral arteries are patent with the right being strongly dominant. Focal calcified plaque in the proximal right V1 segment results in mild stenosis. The proximal left V1 segment is suboptimally evaluated due to motion artifact in the upper mediastinum and the small size of the vessel. Skeleton: Moderate to severe cervical disc and facet degeneration. Grade 1 anterolisthesis of C4 on C5. Other neck: No evidence of acute abnormality or mass. Upper chest: Mild motion artifact and subpleural opacity which may reflect atelectasis. Review of the MIP images confirms the above findings CTA HEAD FINDINGS Anterior circulation: The internal carotid arteries are widely patent from skull base to carotid termini without significant siphon  atherosclerosis. The ACAs and MCAs are patent without evidence of proximal branch occlusion or significant proximal stenosis. There is some asymmetric attenuation of distal left MCA branch vessels in the temporoparietal region. No aneurysm is identified. Posterior circulation: The intracranial vertebral arteries are patent to the basilar with the left being diminutive distal to the PICA origin. Patent PICA and SCA origins are visualized bilaterally. The basilar artery is widely patent. There are large posterior communicating arteries with diminutive or absent P1 segments bilaterally. There is mild distal right posterior communicating/proximal P2 stenosis. No aneurysm is identified. Venous sinuses: As permitted by contrast timing, patent. Anatomic variants: Fetal origins of the PCAs. Review of the MIP images confirms the above findings CT Brain Perfusion Findings: CBF (<30%) Volume: 0 mL Perfusion (Tmax>6.0s) volume: 0 mL Mismatch Volume: n/a Infarction Location: n/a IMPRESSION: 1. No emergent large vessel occlusion. 2. No significant proximal intracranial arterial stenosis. 3. Mild cervical carotid artery atherosclerosis without stenosis. 4. Mild proximal right vertebral artery stenosis. Hypoplastic left vertebral artery. Electronically Signed   By: Logan Bores M.D.   On: 04/12/2018 07:59   Ct Head Code Stroke [place] Contrast  Result Date: 04/12/2018 CLINICAL DATA:  Code stroke. Left facial droop, left-sided weakness, and slurred speech. EXAM: CT HEAD WITHOUT CONTRAST TECHNIQUE: Contiguous axial images were obtained from the base of the skull through the vertex without intravenous contrast. COMPARISON:  02/13/2018 FINDINGS: Brain: There is no evidence of acute infarct, intracranial hemorrhage, mass, midline shift, or extra-axial fluid collection. The ventricles and sulci are normal. Subtle cerebral white matter hypodensities are unchanged and not greater than expected for age. Vascular: No hyperdense vessel. Skull:  No fracture or focal osseous lesion. Sinuses/Orbits: Chronic bilateral ethmoid sinusitis most notable posteriorly. Small mucous retention cysts in the maxillary sinuses. Clear mastoid air cells. Bilateral cataract extraction. Other:  None. ASPECTS ([place] Stroke Program Early CT Score) - Ganglionic level infarction (caudate, lentiform nuclei, internal capsule, insula, M1-M3 cortex): 7 - Supraganglionic infarction (M4-M6 cortex): 3 Total score (0-10 with 10 being normal): 10 IMPRESSION: 1. Unremarkable CT appearance of the brain for age. 2. ASPECTS is 10. These results were communicated to Dr. Rory Percy at 7:11 am on 04/12/2018 by text page via the [facility] messaging system. Electronically Signed   By: Logan Bores M.D.   On: 04/12/2018 07:12    Assessment/Plan: Diagnosis: Infarction within the right central cerebral peduncle.   Labs and images (see above) independently reviewed.  Records reviewed and summated above. Stroke: Continue secondary stroke prophylaxis and Risk Factor Modification listed below:   Antiplatelet therapy:   Blood Pressure Management:  Continue current medication with prn's with permisive HTN per primary team Statin Agent:   Left sided hemiparesis Motor recovery: Fluoxetine  1. Does the need for close, 24 hr/day medical supervision in concert with the patient's rehab needs make it unreasonable for this patient to be served in a less intensive setting? Yes  2. Co-Morbidities requiring supervision/potential complications: diastolic dysfunction (monitor for signs/sypmtoms of fluid overload), HTN (monitor and provide prns in accordance with increased physical exertion and pain), hyperlipidemia (cont meds),  CKD (avoid nephrotoxic meds)  3. Due to safety, skin/wound care, disease management, pain management and patient education, does the patient require 24 hr/day rehab nursing? Yes 4. Does the patient require coordinated care of a physician, rehab nurse, PT (1-2 hrs/day, 5 days/week) and OT  (1-2 hrs/day, 5 days/week) to address physical and functional deficits in the context of the above medical diagnosis(es)? Yes Addressing deficits in the following areas: balance, endurance, locomotion, strength, transferring, bathing, dressing, toileting and psychosocial support 5. Can the patient actively participate in an intensive therapy program of at least 3 hrs of therapy per day at least 5 days per week? Yes 6. The potential for patient to make measurable gains while on inpatient rehab is excellent 7. Anticipated functional outcomes upon discharge from inpatient rehab are supervision and min assist  with PT, supervision and min assist with OT, n/a with SLP. 8. Estimated rehab length of stay to reach the above functional goals is: 9-13 days. 9. Anticipated D/C setting: Home 10. Anticipated post D/C treatments: HH therapy and Home excercise program 11. Overall Rehab/Functional Prognosis: good  RECOMMENDATIONS: This patient's condition is appropriate for continued rehabilitative care in the following setting: CIR Patient has agreed to participate in recommended program. Yes Note that insurance prior authorization may be required for reimbursement for recommended care.  Comment: Rehab Admissions Coordinator to follow up.   I have personally performed a face to face diagnostic evaluation, including, but not limited to relevant history and physical exam findings, of this patient and developed relevant assessment and plan.  Additionally, I have reviewed and concur with the physician assistant's documentation above.   Delice Lesch, MD, ABPMR Lavon Paganini Angiulli, PA-C 04/14/2018

## 2018-04-14 NOTE — Progress Notes (Signed)
Medicine attending: I examined this patient today together with resident physician Dr Lonia Skinner and medical student Mr. Vonita Moss and I concur with their evaluation and management plan which we discussed together.  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  He has a history of hypertension treated with Benicar.  No other known risk factors.  Lipid profile does show mild elevation of LDL cholesterol and triglycerides.  Aspirin and a statin initiated. He has minimal residual deficits on my exam today with decreased left hand grip and subtle 4+/5 decreased strength of his left lower extremity.  He is currently normotensive. Echocardiogram with normal systolic function EF 10-07% with grade 1 diastolic dysfunction.  Normal wall motion.  No valvular abnormalities except for mildly calcified aortic valve leaflets. We appreciate neurology, PT, and OT consultations.  Stroke team recommends inpatient rehab.  Evaluation in progress.  He had a recent extensive evaluation for chronic cough by pulmonologist Dr. Melvyn Novas please see his excellent detailed note on July 23.

## 2018-04-15 ENCOUNTER — Inpatient Hospital Stay (HOSPITAL_COMMUNITY): Payer: PRIVATE HEALTH INSURANCE

## 2018-04-15 ENCOUNTER — Inpatient Hospital Stay (HOSPITAL_COMMUNITY): Payer: PRIVATE HEALTH INSURANCE | Admitting: Occupational Therapy

## 2018-04-15 ENCOUNTER — Inpatient Hospital Stay (HOSPITAL_COMMUNITY): Payer: Medicare Other | Admitting: Physical Therapy

## 2018-04-15 DIAGNOSIS — I69393 Ataxia following cerebral infarction: Secondary | ICD-10-CM

## 2018-04-15 DIAGNOSIS — I69354 Hemiplegia and hemiparesis following cerebral infarction affecting left non-dominant side: Principal | ICD-10-CM

## 2018-04-15 DIAGNOSIS — D7589 Other specified diseases of blood and blood-forming organs: Secondary | ICD-10-CM

## 2018-04-15 DIAGNOSIS — I739 Peripheral vascular disease, unspecified: Secondary | ICD-10-CM

## 2018-04-15 LAB — COMPREHENSIVE METABOLIC PANEL
ALT: 14 U/L (ref 0–44)
AST: 17 U/L (ref 15–41)
Albumin: 3 g/dL — ABNORMAL LOW (ref 3.5–5.0)
Alkaline Phosphatase: 58 U/L (ref 38–126)
Anion gap: 10 (ref 5–15)
BUN: 29 mg/dL — ABNORMAL HIGH (ref 8–23)
CO2: 28 mmol/L (ref 22–32)
Calcium: 9 mg/dL (ref 8.9–10.3)
Chloride: 103 mmol/L (ref 98–111)
Creatinine, Ser: 1.41 mg/dL — ABNORMAL HIGH (ref 0.61–1.24)
GFR calc Af Amer: 52 mL/min — ABNORMAL LOW (ref >60)
GFR calc non Af Amer: 44 mL/min — ABNORMAL LOW (ref >60)
Glucose, Bld: 99 mg/dL (ref 70–99)
Potassium: 4.4 mmol/L (ref 3.5–5.1)
Sodium: 141 mmol/L (ref 135–145)
Total Bilirubin: 1.3 mg/dL — ABNORMAL HIGH (ref 0.3–1.2)
Total Protein: 5.4 g/dL — ABNORMAL LOW (ref 6.5–8.1)

## 2018-04-15 LAB — CBC WITH DIFFERENTIAL/PLATELET
Abs Immature Granulocytes: 0 10*3/uL (ref 0.0–0.1)
Basophils Absolute: 0 10*3/uL (ref 0.0–0.1)
Basophils Relative: 1 %
Eosinophils Absolute: 0.6 10*3/uL (ref 0.0–0.7)
Eosinophils Relative: 8 %
HCT: 46.5 % (ref 39.0–52.0)
Hemoglobin: 15.6 g/dL (ref 13.0–17.0)
Immature Granulocytes: 1 %
Lymphocytes Relative: 30 %
Lymphs Abs: 2.2 10*3/uL (ref 0.7–4.0)
MCH: 34.4 pg — ABNORMAL HIGH (ref 26.0–34.0)
MCHC: 33.5 g/dL (ref 30.0–36.0)
MCV: 102.4 fL — ABNORMAL HIGH (ref 78.0–100.0)
Monocytes Absolute: 0.9 10*3/uL (ref 0.1–1.0)
Monocytes Relative: 12 %
Neutro Abs: 3.7 10*3/uL (ref 1.7–7.7)
Neutrophils Relative %: 50 %
Platelets: 173 10*3/uL (ref 150–400)
RBC: 4.54 MIL/uL (ref 4.22–5.81)
RDW: 12.2 % (ref 11.5–15.5)
WBC: 7.5 10*3/uL (ref 4.0–10.5)

## 2018-04-15 LAB — VITAMIN B12: Vitamin B-12: 310 pg/mL (ref 180–914)

## 2018-04-15 NOTE — Progress Notes (Signed)
Subjective/Complaints:   Objective: Vital Signs: Blood pressure 137/78, pulse 70, temperature 97.8 F (36.6 C), temperature source Oral, resp. rate 18, height 5' 11"  (1.803 m), SpO2 96 %. No results found. Results for orders placed or performed during the hospital encounter of 04/14/18 (from the past 72 hour(s))  CBC WITH DIFFERENTIAL     Status: Abnormal   Collection Time: 04/15/18  6:15 AM  Result Value Ref Range   WBC 7.5 4.0 - 10.5 K/uL   RBC 4.54 4.22 - 5.81 MIL/uL   Hemoglobin 15.6 13.0 - 17.0 g/dL   HCT 46.5 39.0 - 52.0 %   MCV 102.4 (H) 78.0 - 100.0 fL   MCH 34.4 (H) 26.0 - 34.0 pg   MCHC 33.5 30.0 - 36.0 g/dL   RDW 12.2 11.5 - 15.5 %   Platelets 173 150 - 400 K/uL   Neutrophils Relative % 50 %   Neutro Abs 3.7 1.7 - 7.7 K/uL   Lymphocytes Relative 30 %   Lymphs Abs 2.2 0.7 - 4.0 K/uL   Monocytes Relative 12 %   Monocytes Absolute 0.9 0.1 - 1.0 K/uL   Eosinophils Relative 8 %   Eosinophils Absolute 0.6 0.0 - 0.7 K/uL   Basophils Relative 1 %   Basophils Absolute 0.0 0.0 - 0.1 K/uL   Immature Granulocytes 1 %   Abs Immature Granulocytes 0.0 0.0 - 0.1 K/uL    Comment: Performed at [facility], [address] [place], [place] [address]  Comprehensive metabolic panel     Status: Abnormal   Collection Time: 04/15/18  6:15 AM  Result Value Ref Range   Sodium 141 135 - 145 mmol/L   Potassium 4.4 3.5 - 5.1 mmol/L   Chloride 103 98 - 111 mmol/L   CO2 28 22 - 32 mmol/L   Glucose, Bld 99 70 - 99 mg/dL   BUN 29 (H) 8 - 23 mg/dL   Creatinine, Ser 1.41 (H) 0.61 - 1.24 mg/dL   Calcium 9.0 8.9 - 10.3 mg/dL   Total Protein 5.4 (L) 6.5 - 8.1 g/dL   Albumin 3.0 (L) 3.5 - 5.0 g/dL   AST 17 15 - 41 U/L   ALT 14 0 - 44 U/L   Alkaline Phosphatase 58 38 - 126 U/L   Total Bilirubin 1.3 (H) 0.3 - 1.2 mg/dL   GFR calc non Af Amer 44 (L) >60 mL/min   GFR calc Af Amer 52 (L) >60 mL/min    Comment: (NOTE) The eGFR has been calculated using the CKD EPI equation. This  calculation has not been validated in all clinical situations. eGFR's persistently <60 mL/min signify possible Chronic Kidney Disease.    Anion gap 10 5 - 15    Comment: Performed at [facility] [address], [place], [place] [address]     HEENT: mild left facial droop Cardio: RRR and no murmur Resp: CTA B/L and unlabored GI: BS positive and NT, ND Extremity:  No Edema Skin:   Intact Neuro: Alert/Oriented, Cranial Nerve Abnormalities Left central VII, Normal Sensory, Abnormal Motor 4/5 Left delt, Bi, ti grip HF, KE, 3 L ADF, Abnormal FMC Ataxic/ dec FMC and Other HOH Musc/Skel:  Other no pain with UE or LE ROM Gen NAD   Assessment/Plan: 1. 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 Physiatrist is providing close team supervision and 24 hour management of active medical  problems listed below. Physiatrist and rehab team continue to assess barriers to discharge/monitor patient progress toward functional and medical goals. FIM:       Function - Toileting Toileting steps completed by patient: Adjust clothing prior to toileting, Adjust clothing after toileting Toileting steps completed by helper: Performs perineal hygiene Assist level: Touching or steadying assistance (Pt.75%)           Function - Comprehension Comprehension: Auditory Comprehension assist level: Follows complex conversation/direction with extra time/assistive device  Function - Expression Expression: Verbal Expression assist level: Expresses complex ideas: With extra time/assistive device  Function - Social Interaction Social Interaction assist level: Interacts appropriately with others - No medications needed.  Function - Problem Solving Problem solving assist level: Solves complex problems: With extra time  Function - Memory Memory assist level: Complete  Independence: No helper Patient normally able to recall (first 3 days only): That he or she is in a hospital  Medical Problem List and Plan: 1.  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 CIR initial evals 2.  DVT Prophylaxis/Anticoagulation: Subcutaneous Lovenox.  Monitor for any bleeding episodes 3. Pain Management: Tylenol as needed 4. Mood: Provide emotional support 5. Neuropsych: This patient is capable of making decisions on his own behalf. 6. Skin/Wound Care: Routine skin checks 7. Fluids/Electrolytes/Nutrition: Routine in and outs with follow-up chemistries BUN and creat creeping up likely due to poor fluid intake, enc po fluid , recheck BMET in 2-3d 8.  Hypertension.  Permissive hypertension.  Benicar currently on hold. Vitals:   04/14/18 2033 04/15/18 0656  BP: (!) 142/78 137/78  Pulse: 68 70  Resp: 16 18  Temp: 98.9 F (37.2 C) 97.8 F (36.6 C)  SpO2: 96% 96%  in good range 9.  Hyperlipidemia.  Lipitor    LOS (Days) 1 A FACE TO FACE EVALUATION WAS PERFORMED  Duane Park 04/15/2018, 7:46 AM

## 2018-04-15 NOTE — Evaluation (Signed)
Occupational Therapy Assessment and Plan  Patient Details  Name: Duane Park MRN: [id number] Date of Birth: 06/18/1932  OT Diagnosis: hemiplegia affecting non-dominant side and muscle weakness (generalized) Rehab Potential: Rehab Potential (ACUTE ONLY): Good ELOS: 5-7 days   Today's Date: 04/15/2018 OT Individual Time: 9233-0076 OT Individual Time Calculation (min): 68 min     Problem List:  Patient Active Problem List   Diagnosis Date Noted  . Small vessel disease ([place]) 04/14/2018  . Diastolic dysfunction   . Stage 3 chronic kidney disease ([place])   . Aortic atherosclerosis ([place]) 04/13/2018  . Cerebral thrombosis with cerebral infarction 04/13/2018  . Cerebrovascular accident (CVA) ([place])   . Dyslipidemia   . Essential hypertension   . Gastroesophageal reflux disease   . Weakness 04/12/2018  . Chronic cough 03/28/2018  . OSTEOARTHRITIS, BACK 09/13/2009  . ARTHRITIS, KNEES, BILATERAL 09/13/2009  . 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 ABNORMALITY OF GAIT 09/13/2009    Past Medical History:  Past Medical History:  Diagnosis Date  . COPD (chronic obstructive pulmonary disease) ([place])   . Hypercholesteremia   . Hypertension    Past Surgical History:  Past Surgical History:  Procedure Laterality Date  . BACK SURGERY    . JOINT REPLACEMENT     arthroscopic surgery rt side    Assessment & Plan Clinical Impression: Duane Park. Duane Park is an 84 year old right-handed male with history of hypertension, hyperlipidemia.  Presented 04/13/2018 with left-sided weakness.  Per report, wife and patient, patient lives with spouse.  Independent prior to admission.  2 level home with bedroom on Main level and 3 steps to entry.  Wife can assist as needed.  There is a daughter in the area that works.  Cranial CT scan reviewed, unremarkable for acute intracranial process.  CT angiogram of head and neck showed no emergent large vessel occlusion or stenosis.  He did not receive TPA.  MRI of  the brain showed a 6 mm acute/early subacute infarction within the right central cerebral peduncle.  Echocardiogram with ejection fraction of 60% no wall motion abnormalities grade 1 diastolic dysfunction.  Currently maintained on aspirin for CVA prophylaxis and subcutaneous Lovenox for DVT prophylaxis.  He is tolerating a regular diet.  Therapy evaluations have been completed with recommendations of physical medicine rehab consult.  Patient was admitted for a comprehensive rehab program. Patient transferred to CIR on 04/14/2018 .    Patient currently requires min with basic self-care skills secondary to muscle weakness, decreased coordination, decreased visual motor skills, decreased safety awareness and decreased standing balance, decreased postural control, hemiplegia and decreased balance strategies.  Prior to hospitalization, patient could complete ADLs with modified independent .  Patient will benefit from skilled intervention to decrease level of assist with basic self-care skills prior to discharge home with care partner.  Anticipate patient will require intermittent supervision and no further OT follow recommended.  OT - End of Session Activity Tolerance: Tolerates 10 - 20 min activity with multiple rests Endurance Deficit: Yes Endurance Deficit Description: generalized weakness OT Assessment Rehab Potential (ACUTE ONLY): Good OT Patient demonstrates impairments in the following area(s): Balance;Vision;Safety;Endurance;Motor OT Basic ADL's Functional Problem(s): Grooming;Bathing;Dressing;Toileting OT Transfers Functional Problem(s): Toilet;Tub/Shower OT Additional Impairment(s): None OT Plan OT Intensity: Minimum of 1-2 x/day, 45 to 90 minutes OT Frequency: 5 out of 7 days OT Duration/Estimated Length of Stay: 5-7 days OT Treatment/Interventions: Balance/vestibular training;Community reintegration;Disease mangement/prevention;Patient/family education;Self Care/advanced ADL  retraining;Neuromuscular re-education;Therapeutic Exercise;UE/LE Coordination activities;DME/adaptive equipment instruction;Discharge planning;Functional mobility training;Psychosocial support;Therapeutic Activities;UE/LE Strength  taining/ROM OT Self Feeding Anticipated Outcome(s): mod I OT Basic Self-Care Anticipated Outcome(s): mod I OT Toileting Anticipated Outcome(s): (S) OT Bathroom Transfers Anticipated Outcome(s): (S) OT Recommendation Patient destination: Home Follow Up Recommendations: None Equipment Recommended: None recommended by OT   Skilled Therapeutic Intervention Pt seen for skilled OT evaluation. Edu provided to pt and wife re ELOS, rehab expectations, d/c planning, use of AE/AD in the home, condition insight, and OT POC. Pt completed functional mobility in room into walk in shower with min A, using no AD. Pt transferred to shower chair with min A, requiring vc for technique/sequencing UE placement. Pt completed UB/LB bathing sit <>stand with CGA. Pt sat EOB and completed UB dressing with CGA. Min A provided to don L sock, but able to thread pants through L LE without A. Min A provided during standing level clothing management. Extensive discussion with pt and wife re use of already owned DME at home and utilizing them to the fullest (I.e. BSC over toilet instead of raised toilet seat), as well as problem solving through walk in shower and built in seat. B teds were donned with total A for time management, but edu provided to wife in the event pt needs to wear teds at home. [name] wife demonstrated safety with assisting pt in using urinal standing from bed/chair and was signed off on safety sheet. Pt was left sitting EOB with wife present and all needs met.   OT Evaluation Precautions/Restrictions  Precautions Precautions: Fall Restrictions Weight Bearing Restrictions: No General Chart Reviewed: Yes Family/Caregiver Present: Yes(wife)   Pain Pain Assessment Pain Scale:  0-10 Pain Score: 0-No pain Home Living/Prior Functioning Home Living Family/patient expects to be discharged to:: Private residence Living Arrangements: Spouse/significant other Available Help at Discharge: Family, Available 24 hours/day Type of Home: House Home Access: Stairs to enter [employer] of Steps: 3 Entrance Stairs-Rails: (bars installed next to door) Home Layout: Two level, Able to live on main level with bedroom/bathroom Bathroom Shower/Tub: Multimedia programmer: Handicapped height Bathroom Accessibility: Yes  Lives With: Spouse IADL History Homemaking Responsibilities: Yes Meal Prep Responsibility: Secondary Cleaning Responsibility: Secondary Bill Paying/Finance Responsibility: Secondary Shopping Responsibility: Secondary Current License: Yes Mode of Transportation: Car Occupation: Retired Prior Function Level of Independence: Independent with basic ADLs, Independent with transfers, Independent with gait, Independent with homemaking with ambulation  Able to Take Stairs?: Yes Driving: Yes Vocation: Retired Leisure: Hobbies-yes (Comment) Comments: pt and wife go out to eat often and help take care of daughter's dog/spend time with grandkids ADL ADL ADL Comments: See functional navigator Vision Baseline Vision/History: Wears glasses Wears Glasses: At all times Patient Visual Report: Diplopia Vision Assessment?: Yes Ocular Range of Motion: Within Functional Limits Alignment/Gaze Preference: Within Defined Limits Tracking/Visual Pursuits: Decreased smoothness of horizontal tracking;Decreased smoothness of vertical tracking Saccades: Decreased speed of saccadic movement Convergence: Impaired (comment)(reports diplopia at 6 in) Visual Fields: No apparent deficits Diplopia Assessment: Disappears with one eye closed;Objects split on top of one another;Present in far gaze Additional Comments: intermittent diplopia  Perception  Perception:  Within Functional Limits Praxis Praxis: Intact Cognition Overall Cognitive Status: Within Functional Limits for tasks assessed Arousal/Alertness: Awake/alert Orientation Level: Person;Situation;Place Year: 2019 Month: August Day of Week: Correct Memory: Appears intact Immediate Memory Recall: Sock;Blue Memory Recall: Bed Memory Recall Bed: With Cue Attention: Selective Selective Attention: Appears intact Awareness: Appears intact Problem Solving: Appears intact Safety/Judgment: Appears intact Sensation Sensation Light Touch: Appears Intact Proprioception: Appears Intact Coordination Gross Motor Movements are Fluid and Coordinated:  No Fine Motor Movements are Fluid and Coordinated: No Coordination and Movement Description: reduced speed and accuracy of [facility]  Finger Nose Finger Test: Slow but accurate  Motor  Motor Motor: Hemiplegia Motor - Skilled Clinical Observations: generalized weakness Mobility  Bed Mobility Bed Mobility: Rolling Right;Rolling Left;Sit to Supine;Supine to Sit Rolling Right: Supervision/verbal cueing Rolling Left: Supervision/Verbal cueing Supine to Sit: Supervision/Verbal cueing Sit to Supine: Supervision/Verbal cueing Transfers Sit to Stand: Contact Guard/Touching assist Stand to Sit: Contact Guard/Touching assist  Trunk/Postural Assessment  Cervical Assessment Cervical Assessment: Within Functional Limits Thoracic Assessment Thoracic Assessment: Exceptions to WFL(rounded shoulders) Lumbar Assessment Lumbar Assessment: Exceptions to WFL(posterior pelvic tilt) Postural Control Postural Control: Deficits on evaluation Righting Reactions: delayed  Balance Balance Balance Assessed: Yes Dynamic Sitting Balance Sitting balance - Comments: good, able to reach outside of BOS Static Standing Balance Static Standing - Balance Support: No upper extremity supported Static Standing - Level of Assistance: 4: Min assist Dynamic Standing  Balance Dynamic Standing - Balance Support: No upper extremity supported Dynamic Standing - Level of Assistance: 4: Min assist Dynamic Standing - Balance Activities: Reaching across midline Extremity/Trunk Assessment RUE Assessment RUE Assessment: Within Functional Limits LUE Assessment LUE Assessment: Exceptions to [facility] General Strength Comments: 3+/5   See Function Navigator for Current Functional Status.   Refer to Care Plan for Long Term Goals  Recommendations for other services: None    Discharge Criteria: Patient will be discharged from OT if patient refuses treatment 3 consecutive times without medical reason, if treatment goals not met, if there is a change in medical status, if patient makes no progress towards goals or if patient is discharged from hospital.  The above assessment, treatment plan, treatment alternatives and goals were discussed and mutually agreed upon: by patient and by family  Curtis Sites 04/15/2018, 3:20 PM

## 2018-04-15 NOTE — Progress Notes (Signed)
Occupational Therapy Session Note  Patient Details  Name: Duane Park MRN: [id number] Date of Birth: 06/07/1932  Today's Date: 04/15/2018 OT Individual Time: 1500-1555 OT Individual Time Calculation (min): 55 min    Short Term Goals: Week 1:  OT Short Term Goal 1 (Week 1): N/A 2/2 short ELOS  Skilled Therapeutic Interventions/Progress Updates:  Treatment session with focus on functional mobility, LUE  NMR, and shower transfers.  Pt completed stand pivot transfer to w/c with min guard. Engaged in box and block assessment (Rt 45 and Lt: 32 blocks) and 9 hole peg test (Rt: 32 seconds and Lt: 54 seconds); noted decreased shoulder strength and mild coordination impairments.  Pt reports "weak" shoulder and fatigue with UE use.  Engaged in standing activity at high low table with focus on coordination and motor control with reaching, min guard during standing activity.  Engaged in simulated walk-in shower transfer with stepping over 6" yoga blocks to simulate shower entrance.  Min guard during transfers with no difficulty clearing 6" block.  Ambulated back to room with 1 seated rest break with min guard with RW.  Pt left upright in recliner with all needs in reach.  Therapy Documentation Precautions:  Precautions Precautions: Fall Restrictions Weight Bearing Restrictions: No General: General Chart Reviewed: Yes Family/Caregiver Present: Yes(wife) Vital Signs: Therapy Vitals Temp: 98.8 F (37.1 C) Temp Source: Oral Pulse Rate: 76 Resp: 14 BP: (!) 154/85 Patient Position (if appropriate): Sitting Oxygen Therapy SpO2: 97 % O2 Device: Room Air Pain: Pain Assessment Pain Scale: 0-10 Pain Score: 0-No pain  See Function Navigator for Current Functional Status.   Therapy/Group: Individual Therapy  [name] 04/15/2018, 4:06 PM

## 2018-04-15 NOTE — Plan of Care (Signed)
  Problem: Consults Goal: RH STROKE PATIENT EDUCATION Description See Patient Education module for education specifics  Outcome: Progressing   Problem: RH SAFETY Goal: RH STG ADHERE TO SAFETY PRECAUTIONS W/ASSISTANCE/DEVICE Description STG Adhere to Safety Precautions With  Cues/resources/remiders Assistance/Device.  Outcome: Progressing Flowsheets (Taken 04/15/2018 1329) STG:Pt will adhere to safety precautions with assistance/device: 4-Minimal assistance   Problem: RH KNOWLEDGE DEFICIT Goal: RH STG INCREASE KNOWLEDGE OF HYPERTENSION Description Pt will be able to explain management of HTN including diet, medications and exercise using handouts, reminders independently  Outcome: Progressing   Problem: RH BOWEL ELIMINATION Goal: RH STG MANAGE BOWEL WITH ASSISTANCE Description STG Manage Bowel with Assistance. Outcome: Progressing Flowsheets (Taken 04/15/2018 1329) STG: Pt will manage bowels with assistance: 5-Supervision/curing Goal: RH STG MANAGE BOWEL W/MEDICATION W/ASSISTANCE Description STG Manage Bowel with Medication with Assistance. Outcome: Progressing Flowsheets (Taken 04/15/2018 1329) STG: Pt will manage bowels with medication with assistance: 6-Modified independent   Problem: RH BLADDER ELIMINATION Goal: RH STG MANAGE BLADDER WITH ASSISTANCE Description STG Manage Bladder With Assistance Outcome: Progressing Flowsheets (Taken 04/15/2018 1329) STG: Pt will manage bladder with assistance: 5-Supervision/cueing   Problem: RH SKIN INTEGRITY Goal: RH STG SKIN FREE OF INFECTION/BREAKDOWN Outcome: Progressing Goal: RH STG MAINTAIN SKIN INTEGRITY WITH ASSISTANCE Description STG Maintain Skin Integrity With Assistance. Outcome: Progressing Flowsheets (Taken 04/15/2018 1329) STG: Maintain skin integrity with assistance: 5-Supervision/cueing   Problem: RH PAIN MANAGEMENT Goal: RH STG PAIN MANAGED AT OR BELOW [name] PAIN GOAL Outcome: Progressing

## 2018-04-15 NOTE — Care Management Note (Signed)
Inpatient Rehabilitation Center Individual Statement of Services  Patient Name:  Duane Park  Date:  04/15/2018  Welcome to the [facility].  Our goal is to provide you with an individualized program based on your diagnosis and situation, designed to meet your specific needs.  With this comprehensive rehabilitation program, you will be expected to participate in at least 3 hours of rehabilitation therapies Monday-Friday, with modified therapy programming on the weekends.  Your rehabilitation program will include the following services:  Physical Therapy (PT), Occupational Therapy (OT), Speech Therapy (ST), 24 hour per day rehabilitation nursing, Neuropsychology, Case Management (Social Worker), Rehabilitation Medicine, Nutrition Services and Pharmacy Services  Weekly team conferences will be held on Wednesday to discuss your progress.  Your Social Worker will talk with you frequently to get your input and to update you on team discussions.  Team conferences with you and your family in attendance may also be held.  Expected length of stay: 5-7 days Overall anticipated outcome: independent with assistive device  Depending on your progress and recovery, your program may change. Your Social Worker will coordinate services and will keep you informed of any changes. Your Social Worker's name and contact numbers are listed  below.  The following services may also be recommended but are not provided by the [facility] will be made to provide these services after discharge if needed.  Arrangements include referral to agencies that provide these services.  Your insurance has been verified to be:  Medicare & Commerical Your primary doctor is:  Greig Right  Pertinent information will be shared with your doctor and your insurance  company.  Social Worker:  Ovidio Kin, [place] or ([phone]  Information discussed with and copy given to patient by: Elease Hashimoto, 04/15/2018, 12:27 PM

## 2018-04-15 NOTE — Progress Notes (Signed)
Patient information reviewed and entered into eRehab system by Reynalda Canny, RN, CRRN, PPS Coordinator.  Information including medical coding and functional independence measure will be reviewed and updated through discharge.     Per nursing patient was given "Data Collection Information Summary for Patients in Inpatient Rehabilitation Facilities with attached "Privacy Act Statement-Health Care Records" upon admission.  

## 2018-04-15 NOTE — Evaluation (Signed)
Physical Therapy Assessment and Plan  Patient Details  Name: Duane Park MRN: [id number] Date of Birth: [birth year]  PT Diagnosis: Abnormality of gait, Coordination disorder, Difficulty walking, Hemiplegia non-dominant and Muscle weakness Rehab Potential: Excellent ELOS: 5- 7 days   Today's Date: 04/15/2018 PT Individual Time: 1305-1420 PT Individual Time Calculation (min): 75 min    Problem List:  Patient Active Problem List   Diagnosis Date Noted  . Small vessel disease ([place]) 04/14/2018  . Diastolic dysfunction   . Stage 3 chronic kidney disease ([place])   . Aortic atherosclerosis ([place]) 04/13/2018  . Cerebral thrombosis with cerebral infarction 04/13/2018  . Cerebrovascular accident (CVA) ([place])   . Dyslipidemia   . Essential hypertension   . Gastroesophageal reflux disease   . Weakness 04/12/2018  . Chronic cough 03/28/2018  . OSTEOARTHRITIS, BACK 09/13/2009  . ARTHRITIS, KNEES, BILATERAL 09/13/2009  . 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 ABNORMALITY OF GAIT 09/13/2009    Past Medical History:  Past Medical History:  Diagnosis Date  . COPD (chronic obstructive pulmonary disease) ([place])   . Hypercholesteremia   . Hypertension    Past Surgical History:  Past Surgical History:  Procedure Laterality Date  . BACK SURGERY    . JOINT REPLACEMENT     arthroscopic surgery rt side    Assessment & Plan Clinical Impression: Patient is an 84 year old right-handed male with history of hypertension, hyperlipidemia.  Presented 04/13/2018 with left-sided weakness.  Per report, wife and patient, patient lives with spouse.  Independent prior to admission.  2 level home with bedroom on Main level and 3 steps to entry.  Wife can assist as needed.  There is a daughter in the area that works.  Cranial CT scan reviewed, unremarkable for acute intracranial process.  CT angiogram of head and neck showed no emergent large vessel occlusion or stenosis.  He did not receive TPA.  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  Echocardiogram with ejection fraction of 60% no wall motion abnormalities grade 1 diastolic dysfunction.  Currently maintained on aspirin for CVA prophylaxis and subcutaneous Lovenox for DVT prophylaxis.  He is tolerating a regular diet.  Therapy evaluations have been completed with recommendations of physical medicine rehab consult.  Patient was admitted for a comprehensive rehab program.  Patient transferred to CIR on 04/14/2018 .   Patient currently requires min with mobility secondary to muscle weakness, decreased cardiorespiratoy endurance, decreased coordination, and decreased standing balance, decreased postural control, hemiplegia and decreased balance strategies.  Prior to hospitalization, patient was independent  with mobility and lived with Spouse in a House home.  Home access is 3Stairs to enter.  Patient will benefit from skilled PT intervention to maximize safe functional mobility, minimize fall risk and decrease caregiver burden for planned discharge home with intermittent assist.  Anticipate patient will benefit from follow up [facility] at discharge.  PT - End of Session Activity Tolerance: Decreased this session Endurance Deficit: Yes Endurance Deficit Description: generalized weakness PT Assessment Rehab Potential (ACUTE/IP ONLY): Excellent PT Patient demonstrates impairments in the following area(s): Balance;Endurance;Motor PT Transfers Functional Problem(s): Bed Mobility;Bed to Chair;Car;Furniture PT Locomotion Functional Problem(s): Ambulation;Stairs;Wheelchair Mobility PT Plan PT Intensity: Minimum of 1-2 x/day ,45 to 90 minutes PT Frequency: 5 out of 7 days PT Duration Estimated Length of Stay: 5- 7 days PT Treatment/Interventions: Ambulation/gait training;Community reintegration;DME/adaptive equipment instruction;Neuromuscular re-education;Psychosocial support;Stair training;UE/LE  Strength taining/ROM;Wheelchair propulsion/positioning;Therapeutic Activities;UE/LE Coordination activities;Skin care/wound management;Pain management;Functional electrical  stimulation;Discharge planning;Balance/vestibular training;Disease management/prevention;Functional mobility training;Patient/family education;Splinting/orthotics;Visual/perceptual remediation/compensation;Therapeutic Exercise PT Transfers Anticipated Outcome(s): supervision<>mod I with LRAD PT Locomotion Anticipated Outcome(s): supervision with LRAD PT Recommendation Recommendations for Other Services: Therapeutic Recreation consult Therapeutic Recreation Interventions: Pet therapy;Outing/community reintergration Follow Up Recommendations: Home health PT Patient destination: Home Equipment Recommended: Rolling walker with 5" wheels  Skilled Therapeutic Intervention Pt received sitting on EOB with wife present but not accompanying pt to gym. No c/o pain reported. Therapist educated pt on ELOS, weekly interdisciplinary team meetings, safety recommendations (wife only checked off to assist him with sit>stand to use urinal per OT), daily CIR therapy, and other various CIR information. Transported pt to gym and pt ambulates x 45 ft without AD and min assist, but when given RW only requires CGA. Pt completes all functional mobility tasks with steady assist<>min assist overall with AD, please see below for additional details. Pt reports need to use restroom and is returned to room via w/c total assist. Pt able to stand with steady assist while voiding into toilet but pt has spillage due to urgency and wife & therapist assist pt with changing into clean clothes for time management. Pt utilizes nu-step on level 4 x 10 minutes with BLE & LUE (pt with hx of spurs in R shoulder & need for rotator cuff repair (per pt) therefore pt elects not to use RUE) with activity focusing on endurance training, strengthening, and coordination of reciprocal  movement. At end of session pt left sitting in recliner with wife present to supervise.   PT Evaluation Precautions/Restrictions Precautions Precautions: Fall Restrictions Weight Bearing Restrictions: No  General Chart Reviewed: Yes Additional Pertinent History: HTN, HLD, COPD Response to Previous Treatment: Patient with no complaints from previous session. Family/Caregiver Present: Yes(wife present in room, did not go to gym with pt/therapist)  Home Living/Prior [place] Arrangements: Spouse/significant other Available Help at Discharge: Family;Available 24 hours/day Type of Home: House Home Access: Stairs to enter [employer] of Steps: 3 Entrance Stairs-Rails: (has bars he can (and usually does) hold to) Home Layout: Two level;Able to live on main level with bedroom/bathroom  Lives With: Spouse(Janie) Prior Function Level of Independence: Independent with basic ADLs;Independent with transfers;Independent with gait;Independent with homemaking with ambulation  Able to Take Stairs?: Yes Driving: Yes Vocation: Retired  Vision/Perception  Pt wears glasses at all times at baseline. Pt reports chronic diplopia (began ~20 years ago). No new visual deficits apparent.  Cognition Overall Cognitive Status: Within Functional Limits for tasks assessed Arousal/Alertness: Awake/alert Memory: Appears intact Awareness: Appears intact Problem Solving: Appears intact   Sensation Sensation Light Touch: Appears Intact(BLE) Proprioception: Appears Intact(BLE) Coordination Gross Motor Movements are Fluid and Coordinated: No(LLE heel to shin delayed & slightly impaired coordination)  Motor  Motor Motor: Hemiplegia Motor - Skilled Clinical Observations: generalized weakness   Mobility Bed Mobility Bed Mobility: Rolling Right;Rolling Left;Sit to Supine;Supine to Sit Rolling Right: Supervision/verbal cueing Rolling Left: Supervision/Verbal  cueing Supine to Sit: Supervision/Verbal cueing Sit to Supine: Supervision/Verbal cueing Transfers Transfers: Sit to Stand;Stand to Sit Sit to Stand: Contact Guard/Touching assist Stand to Sit: Contact Guard/Touching assist Transfer (Assistive device): (armrests)  Locomotion  Gait Ambulation: Yes Gait Assistance: Minimal Assistance - Patient > 75% Gait Distance (Feet): 45 Feet Assistive device: None Gait Assistance Details: Manual facilitation for weight shifting Gait Gait: Yes Gait Pattern: Impaired Gait Pattern: (decreased weight shifting L, decreased step length BLE, decreased stride length, decreased gait speed) Stairs / Additional Locomotion Stairs: Yes Stairs Assistance: Minimal Assistance -  Patient > 75% Stair Management Technique: Two rails Number of Stairs: 12 Height of Stairs: (6" + 3") Ramp: Minimal Assistance - Patient >75%(ambulatory with RW) Wheelchair Mobility Wheelchair Mobility: No   Trunk/Postural Assessment  Cervical Assessment Cervical Assessment: Within Functional Limits Thoracic Assessment Thoracic Assessment: Exceptions to WFL(rounded shoulders) Lumbar Assessment Lumbar Assessment: Exceptions to WFL(posterior pelvic tilt) Postural Control Postural Control: Deficits on evaluation Righting Reactions: impaired   Balance Balance Balance Assessed: Yes Static Standing Balance Static Standing - Balance Support: No upper extremity supported Static Standing - Level of Assistance: 4: CGA (while standing at toilet to void) Dynamic Standing Balance Dynamic Standing - Balance Support: No upper extremity supported Dynamic Standing - Level of Assistance: 4: Min assist Dynamic Standing - Balance Activities: (during gait without AD)  Extremity Assessment  RUE Assessment RUE Assessment: Within Functional Limits LUE Assessment LUE Assessment: Within Functional Limits RLE Assessment RLE Assessment: Within Functional Limits LLE Assessment LLE Assessment:  Exceptions to [facility] Passive Range of Motion (PROM) Comments: WFL Active Range of Motion (AROM) Comments: WFL General Strength Comments: 3/5 except ankle dorsiflexion 3+/5   See Function Navigator for Current Functional Status.   Refer to Care Plan for Long Term Goals  Recommendations for other services: Therapeutic Recreation  Pet therapy and Outing/community reintegration  Discharge Criteria: Patient will be discharged from PT if patient refuses treatment 3 consecutive times without medical reason, if treatment goals not met, if there is a change in medical status, if patient makes no progress towards goals or if patient is discharged from hospital.  The above assessment, treatment plan, treatment alternatives and goals were discussed and mutually agreed upon: by patient  Waunita Schooner 04/15/2018, 4:06 PM

## 2018-04-15 NOTE — Progress Notes (Signed)
Social Work  Social Work Assessment and Plan  Patient Details  Name: Duane Park MRN: [id number] Date of Birth: January 06, 1932  Today's Date: 04/15/2018  Problem List:  Patient Active Problem List   Diagnosis Date Noted  . Small vessel disease ([place]) 04/14/2018  . Diastolic dysfunction   . Stage 3 chronic kidney disease ([place])   . Aortic atherosclerosis ([place]) 04/13/2018  . Cerebral thrombosis with cerebral infarction 04/13/2018  . Cerebrovascular accident (CVA) ([place])   . Dyslipidemia   . Essential hypertension   . Gastroesophageal reflux disease   . Weakness 04/12/2018  . Chronic cough 03/28/2018  . OSTEOARTHRITIS, BACK 09/13/2009  . ARTHRITIS, KNEES, BILATERAL 09/13/2009  . 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 ABNORMALITY OF GAIT 09/13/2009   Past Medical History:  Past Medical History:  Diagnosis Date  . COPD (chronic obstructive pulmonary disease) ([place])   . Hypercholesteremia   . Hypertension    Past Surgical History:  Past Surgical History:  Procedure Laterality Date  . BACK SURGERY    . JOINT REPLACEMENT     arthroscopic surgery rt side   Social History:  reports that he has never smoked. He has never used smokeless tobacco. He reports that he drank alcohol. He reports that he does not use drugs.  Family / Support Systems Marital Status: Married Patient Roles: Spouse, Parent Spouse/Significant Other: Janie [phone]-home Children: [name] Brown-daughter [phone] Other Supports: Church members and friends Anticipated Caregiver: Wife Ability/Limitations of Caregiver: Wife is in good health and can provide some min assist level Caregiver Availability: 24/7 Family Dynamics: Close knit family they have a local daughter their son passed away 66 years ago. They are close with their friends and church members and find comfort and support from them  Social History Preferred language: English Religion: Baptist Cultural Background: No issues Education:  Secretary/administrator educated Read: Yes Write: Yes Employment Status: Retired Freight forwarder Issues: No issues Guardian/Conservator: None-according to MD pt is capable of making his own decisions while here, wife has been staying here with him   Abuse/Neglect Abuse/Neglect Assessment Can Be Completed: Yes Physical Abuse: Denies Verbal Abuse: Denies Sexual Abuse: Denies Exploitation of patient/patient's resources: Denies Self-Neglect: Denies  Emotional Status [name] affect, behavior adn adjustment status: Pt is motivated and very pleased with how well he has progressed already, he is hopeful this will continue. He has always been independent and active and wants to remain this way when leaves here. His wife voiced he is one who wants to do on his own rather than someone help him. Recent Psychosocial Issues: other health issues-that were managed prior to admission Pyschiatric History: No history-deferred depression screen due to doing well and verbalizing his concerns. He may benefit from seeing neuro-psych while here for coping, will ask team. Substance Abuse History: No issues  Patient / Family Perceptions, Expectations & Goals Pt/Family understanding of illness & functional limitations: Pt and wife can explain his stroke and deficits. They both talk with the MD daily and feel their questions have been answered and know the plan going forward. Wife has been staying here with pt and plans to go home tomorrow. Premorbid pt/family roles/activities: Husband, father, retiree, church member, etc Anticipated changes in roles/activities/participation: resume Pt/family expectations/goals: Pt states: " I want to do all that I can to recover from this."  Wife states: " He is doing well but I don't want him rushed out of here."  [employer]: None Premorbid Home Care/DME Agencies: None Transportation available at  discharge: Wife Resource referrals recommended:  Neuropsychology, Support group (specify)  Discharge Planning Living Arrangements: Spouse/significant other Support Systems: Spouse/significant other, Children, Friends/neighbors, Church/faith community Type of Residence: Private residence Insurance Resources: [employer], Multimedia programmer (Engineer, agricultural) Financial Resources: Radio broadcast assistant Screen Referred: No Living Expenses: Own Money Management: Spouse, Patient Does the patient have any problems obtaining your medications?: No Home Management: Both wife does inside and pt did outside Patient/Family Preliminary Plans: Return home with wife who can provide some physical assist if needed. She has been here and observed him in therapies. Both are pleased with how well he is doing and hopeful this will continue. Will await team's evaluations and work on a discharge plan. Social Work Anticipated Follow Up Needs: HH/OP, Support Group  Clinical Impression Pleasant gentleman who is doing well and progressing daily in his therapies. His wife has been here and very supportive and involved. Both are hopeful he will do well here. Wife can provide assist if needed. Pt is hopeful this will not be needed. Will work on discharge needs and see if would benefit from seeing neuro-psych while here.  Elease Hashimoto 04/15/2018, 12:25 PM

## 2018-04-16 ENCOUNTER — Inpatient Hospital Stay (HOSPITAL_COMMUNITY): Payer: PRIVATE HEALTH INSURANCE | Admitting: Occupational Therapy

## 2018-04-16 ENCOUNTER — Inpatient Hospital Stay (HOSPITAL_COMMUNITY): Payer: Medicare Other | Admitting: Occupational Therapy

## 2018-04-16 ENCOUNTER — Encounter (HOSPITAL_COMMUNITY): Payer: PRIVATE HEALTH INSURANCE | Admitting: Psychology

## 2018-04-16 ENCOUNTER — Inpatient Hospital Stay (HOSPITAL_COMMUNITY): Payer: PRIVATE HEALTH INSURANCE

## 2018-04-16 NOTE — Progress Notes (Signed)
Social Work   Desiree Daise, Eliezer Champagne  Social Worker  Physical Medicine and Rehabilitation  Patient Care Conference  Signed  Date of Service:  04/16/2018  1:28 PM          Signed          Show:Clear all [x] Manual[x] Template[] Copied  Added by: [x] Jerren Flinchbaugh, [name], LCSW   [] Hover for details   Inpatient RehabilitationTeam Conference and Plan of Care Update Date: 04/16/2018   Time: 10:30 AM      Patient Name: Duane Park      Medical Record Number: [id number]  Date of Birth: 04-23-1932 Sex: Male         Room/Bed: 4W15C/4W15C-01 Payor Info: Payor: MEDICARE / Plan: MEDICARE PART A AND B / Product Type: *No Product type* /     Admitting Diagnosis: FTS CVA  Admit Date/Time:  04/14/2018  4:37 PM Admission Comments: No comment available    Primary Diagnosis:  <principal problem not specified> Principal Problem: <principal problem not specified>       Patient Active Problem List    Diagnosis Date Noted  . Small vessel disease ([place]) 04/14/2018  . Diastolic dysfunction    . Stage 3 chronic kidney disease ([place])    . Aortic atherosclerosis ([place]) 04/13/2018  . Cerebral thrombosis with cerebral infarction 04/13/2018  . Cerebrovascular accident (CVA) ([place])    . Dyslipidemia    . Essential hypertension    . Gastroesophageal reflux disease    . Weakness 04/12/2018  . Chronic cough 03/28/2018  . OSTEOARTHRITIS, BACK 09/13/2009  . ARTHRITIS, KNEES, BILATERAL 09/13/2009  . 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 ABNORMALITY OF GAIT 09/13/2009      Expected Discharge Date: Expected Discharge Date: 04/19/18   Team Members Present: Physician leading conference: Dr. Alysia Penna Social Worker Present: Ovidio Kin, LCSW Nurse Present: Isla Pence, RN PT Present: Lavone Nian, PT OT Present: [name], OT SLP Present: Stormy Fabian, SLP PPS Coordinator present : Daiva Nakayama, RN, CRRN       Current Status/Progress Goal Weekly Team Focus  Medical    Left sided weakness and decreased coordination.  Permissive hypertension  Normalized blood pressure prior to discharge  Initiate rehabilitation program   Bowel/Bladder     Continent of bowel/bladder; LBM 04/15/18  Remain continent with mni assist  Assess and treat any bowel/bladder issues q shift and asw needed   Swallow/Nutrition/ Hydration               ADL's     min assist toilet and shower transfers, min assist bathing and LB dressing, supervision UB dressing  supervision bathing, bathroom transfers - Mod I dressing and grooming  ADL retraining, dynamic standing balance, LUE NMR, family education, d/c planning   Mobility     min assist without AD, steady assist with RW  supervision<>mod I overall with LRAD, min assist floor transfer  L NMR, transfers, gait, balance, pt/family education, endurance, strengthening   Communication               Safety/Cognition/ Behavioral Observations             Pain     No complain of pain  <2  Assess and treat pain q shift and as needed   Skin     No skin issue noted  Skin remain intact with min assist  Assess and address any skin issue q shift and as needed     *See Care Plan and progress notes for  long and short-term goals.      Barriers to Discharge   Current Status/Progress Possible Resolutions Date Resolved   Physician     Medical stability     Initiating rehab program  See above      Nursing                 PT                    OT                 SLP            SW              Discharge Planning/Teaching Needs:  Home with wife who has been staying here with pt and observing him in therapies. Doesn't want him to leave too soon.       Team Discussion:  Goals supervision-mod.i level. Has bal;ance and endurance issues working on. Left leg and arm weak but can use functionally. Berg 32/56. Speech evaluated and does not plan to follow. Wife staying and observing in therapies. Concerned about leaving too early.  Revisions to  Treatment Plan:  DC 8/10    Continued Need for Acute Rehabilitation Level of Care: The patient requires daily medical management by a physician with specialized training in physical medicine and rehabilitation for the following conditions: Daily direction of a multidisciplinary physical rehabilitation program to ensure safe treatment while eliciting the highest outcome that is of practical value to the patient.: Yes Daily analysis of laboratory values and/or radiology reports with any subsequent need for medication adjustment of medical intervention for : Neurological problems   Gedalya Jim, [name] 04/16/2018, 1:28 PM                  Lean Jaeger, [name], LCSW  Social Worker  Physical Medicine and Rehabilitation  Patient Care Conference  Signed  Date of Service:  04/16/2018  1:28 PM          Signed          Show:Clear all [x] Manual[x] Template[] Copied  Added by: [x] Elease Hashimoto, LCSW   [] Hover for details   Inpatient RehabilitationTeam Conference and Plan of Care Update Date: 04/16/2018   Time: 10:30 AM      Patient Name: Duane Park      Medical Record Number: [id number]  Date of Birth: [birth year] Sex: Male         Room/Bed: 4W15C/4W15C-01 Payor Info: Payor: MEDICARE / Plan: MEDICARE PART A AND B / Product Type: *No Product type* /     Admitting Diagnosis: FTS CVA  Admit Date/Time:  04/14/2018  4:37 PM Admission Comments: No comment available    Primary Diagnosis:  <principal problem not specified> Principal Problem: <principal problem not specified>       Patient Active Problem List    Diagnosis Date Noted  . Small vessel disease ([place]) 04/14/2018  . Diastolic dysfunction    . Stage 3 chronic kidney disease ([place])    . Aortic atherosclerosis ([place]) 04/13/2018  . Cerebral thrombosis with cerebral infarction 04/13/2018  . Cerebrovascular accident (CVA) ([place])    . Dyslipidemia    . Essential hypertension    . Gastroesophageal reflux disease    . Weakness  04/12/2018  . Chronic cough 03/28/2018  . OSTEOARTHRITIS, BACK 09/13/2009  . ARTHRITIS, KNEES, BILATERAL 09/13/2009  . 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 ABNORMALITY OF GAIT 09/13/2009  Expected Discharge Date: Expected Discharge Date: 04/19/18   Team Members Present: Physician leading conference: Dr. Alysia Penna Social Worker Present: Ovidio Kin, LCSW Nurse Present: Isla Pence, RN PT Present: Lavone Nian, PT OT Present: [name], OT SLP Present: Stormy Fabian, SLP PPS Coordinator present : Daiva Nakayama, RN, CRRN       Current Status/Progress Goal Weekly Team Focus  Medical     Left sided weakness and decreased coordination.  Permissive hypertension  Normalized blood pressure prior to discharge  Initiate rehabilitation program   Bowel/Bladder     Continent of bowel/bladder; LBM 04/15/18  Remain continent with mni assist  Assess and treat any bowel/bladder issues q shift and asw needed   Swallow/Nutrition/ Hydration               ADL's     min assist toilet and shower transfers, min assist bathing and LB dressing, supervision UB dressing  supervision bathing, bathroom transfers - Mod I dressing and grooming  ADL retraining, dynamic standing balance, LUE NMR, family education, d/c planning   Mobility     min assist without AD, steady assist with RW  supervision<>mod I overall with LRAD, min assist floor transfer  L NMR, transfers, gait, balance, pt/family education, endurance, strengthening   Communication               Safety/Cognition/ Behavioral Observations             Pain     No complain of pain  <2  Assess and treat pain q shift and as needed   Skin     No skin issue noted  Skin remain intact with min assist  Assess and address any skin issue q shift and as needed     *See Care Plan and progress notes for long and short-term goals.      Barriers to Discharge   Current Status/Progress Possible Resolutions Date Resolved   Physician       Medical stability     Initiating rehab program  See above      Nursing                 PT                    OT                 SLP            SW              Discharge Planning/Teaching Needs:  Home with wife who has been staying here with pt and observing him in therapies. Doesn't want him to leave too soon.       Team Discussion:  Goals supervision-mod.i level. Has bal;ance and endurance issues working on. Left leg and arm weak but can use functionally. Berg 32/56. Speech evaluated and does not plan to follow. Wife staying and observing in therapies. Concerned about leaving too early.  Revisions to Treatment Plan:  DC 8/10    Continued Need for Acute Rehabilitation Level of Care: The patient requires daily medical management by a physician with specialized training in physical medicine and rehabilitation for the following conditions: Daily direction of a multidisciplinary physical rehabilitation program to ensure safe treatment while eliciting the highest outcome that is of practical value to the patient.: Yes Daily analysis of laboratory values and/or radiology reports with any subsequent need for medication adjustment of medical intervention for : Neurological problems  Elease Hashimoto 04/16/2018, 1:28 PM                 Patient ID: KEISUKE HOLLABAUGH, male   DOB: 1932-03-14, 84 y.o.   MRN: [id number]

## 2018-04-16 NOTE — Progress Notes (Signed)
Subjective/Complaints:  Does not remember my name but recalls seeing me yesterday ROS-  Neg CP, SOB, N/V/D  Objective: Vital Signs: Blood pressure (!) 146/84, pulse 70, temperature 97.7 F (36.5 C), temperature source Oral, resp. rate 18, height _0  (1.803 m), weight 93.3 kg (205 lb 11 oz), SpO2 96 %. No results found. Results for orders placed or performed during the hospital encounter of 04/14/18 (from the past 72 hour(s))  CBC WITH DIFFERENTIAL     Status: Abnormal   Collection Time: 04/15/18  6:15 AM  Result Value Ref Range   WBC 7.5 4.0 - 10.5 K/uL   RBC 4.54 4.22 - 5.81 MIL/uL   Hemoglobin 15.6 13.0 - 17.0 g/dL   HCT 46.5 39.0 - 52.0 %   MCV 102.4 (H) 78.0 - 100.0 fL   MCH 34.4 (H) 26.0 - 34.0 pg   MCHC 33.5 30.0 - 36.0 g/dL   RDW 12.2 11.5 - 15.5 %   Platelets 173 150 - 400 K/uL   Neutrophils Relative % 50 %   Neutro Abs 3.7 1.7 - 7.7 K/uL   Lymphocytes Relative 30 %   Lymphs Abs 2.2 0.7 - 4.0 K/uL   Monocytes Relative 12 %   Monocytes Absolute 0.9 0.1 - 1.0 K/uL   Eosinophils Relative 8 %   Eosinophils Absolute 0.6 0.0 - 0.7 K/uL   Basophils Relative 1 %   Basophils Absolute 0.0 0.0 - 0.1 K/uL   Immature Granulocytes 1 %   Abs Immature Granulocytes 0.0 0.0 - 0.1 K/uL    Comment: Performed at [facility], [address] [place], [place] [address]  Comprehensive metabolic panel     Status: Abnormal   Collection Time: 04/15/18  6:15 AM  Result Value Ref Range   Sodium 141 135 - 145 mmol/L   Potassium 4.4 3.5 - 5.1 mmol/L   Chloride 103 98 - 111 mmol/L   CO2 28 22 - 32 mmol/L   Glucose, Bld 99 70 - 99 mg/dL   BUN 29 (H) 8 - 23 mg/dL   Creatinine, Ser 1.41 (H) 0.61 - 1.24 mg/dL   Calcium 9.0 8.9 - 10.3 mg/dL   Total Protein 5.4 (L) 6.5 - 8.1 g/dL   Albumin 3.0 (L) 3.5 - 5.0 g/dL   AST 17 15 - 41 U/L   ALT 14 0 - 44 U/L   Alkaline Phosphatase 58 38 - 126 U/L   Total Bilirubin 1.3 (H) 0.3 - 1.2 mg/dL   GFR calc non Af Amer 44 (L) >60 mL/min   GFR  calc Af Amer 52 (L) >60 mL/min    Comment: (NOTE) The eGFR has been calculated using the CKD EPI equation. This calculation has not been validated in all clinical situations. eGFR's persistently <60 mL/min signify possible Chronic Kidney Disease.    Anion gap 10 5 - 15    Comment: Performed at [facility] [address] [place], [place] [address]  Vitamin B12     Status: None   Collection Time: 04/15/18  8:31 AM  Result Value Ref Range   Vitamin B-12 310 180 - 914 pg/mL    Comment: (NOTE) This assay is not validated for testing neonatal or myeloproliferative syndrome specimens for Vitamin B12 levels. Performed at [facility], [place] [address] [place], [place] [address]      HEENT: mild left facial droop Cardio: RRR and no murmur Resp: CTA B/L and unlabored GI: BS positive and NT, ND Extremity:  No Edema Skin:   Intact Neuro: Alert/Oriented, Cranial Nerve Abnormalities Left central VII, Normal Sensory, Abnormal Motor 4/5 Left delt, Bi, ti grip HF, KE, 3 L ADF, Abnormal FMC Ataxic/ dec FMC and Other HOH Musc/Skel:  Other no pain with UE or LE ROM Gen NAD   Assessment/Plan: 1. 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 Physiatrist is providing close team supervision and 24 hour management of active medical problems listed below. Physiatrist and rehab team continue to assess barriers to discharge/monitor patient progress toward functional and medical goals. FIM: Function - Bathing Position: Shower Body parts bathed by patient: Right arm, Right lower leg, Left arm, Left upper leg, Chest, Abdomen, Left lower leg, Front perineal area, Buttocks, Right upper leg Body parts bathed by helper: Back Assist Level: Touching or steadying assistance(Pt > 75%)(CGA in standing)  Function- Upper Body Dressing/Undressing What is the patient wearing?: Pull  over shirt/dress Pull over shirt/dress - Perfomed by patient: Thread/unthread right sleeve, Thread/unthread left sleeve, Put head through opening, Pull shirt over trunk Assist Level: Supervision or verbal cues, Set up Set up : To obtain clothing/put away Function - Lower Body Dressing/Undressing What is the patient wearing?: Underwear, Pants, [employer], Socks, Shoes Position: Sitting EOB Underwear - Performed by patient: Thread/unthread right underwear leg, Thread/unthread left underwear leg, Pull underwear up/down Pants- Performed by patient: Thread/unthread right pants leg, Thread/unthread left pants leg, Pull pants up/down Socks - Performed by patient: Don/doff right sock Socks - Performed by helper: Don/doff left sock TED Hose - Performed by helper: Don/doff right TED hose, Don/doff left TED hose Assist for footwear: Partial/moderate assist Assist for lower body dressing: Touching or steadying assistance (Pt > 75%)  Function - Toileting Toileting steps completed by patient: Adjust clothing prior to toileting, Performs perineal hygiene, Adjust clothing after toileting Toileting steps completed by helper: Adjust clothing after toileting Assist level: Touching or steadying assistance (Pt.75%)  Function - Toilet Transfers Toilet transfer assistive device: Elevated toilet seat/BSC over toilet Assist level to toilet: Touching or steadying assistance (Pt > 75%) Assist level from toilet: Touching or steadying assistance (Pt > 75%)  Function - Chair/bed transfer Chair/bed transfer method: Squat pivot Chair/bed transfer assist level: Touching or steadying assistance (Pt > 75%) Chair/bed transfer assistive device: Armrests Chair/bed transfer details: Verbal cues for sequencing, Verbal cues for technique  Function - Locomotion: Wheelchair Will patient use wheelchair at discharge?: No Wheelchair activity did not occur: N/A Wheel 50 feet with 2 turns activity did not occur: N/A Wheel 150 feet  activity did not occur: N/A Function - Locomotion: Ambulation Assistive device: No device Max distance: 45 ft Assist level: Touching or steadying assistance (Pt > 75%) Assist level: Touching or steadying assistance (Pt > 75%) Walk 50 feet with 2 turns activity did not occur: Safety/medical concerns Walk 150 feet activity did not occur: Safety/medical concerns Assist level: Touching or steadying assistance (Pt > 75%)(with RW)  Function - Comprehension Comprehension: Auditory Comprehension assistive device: Hearing aids Comprehension assist level: Follows complex conversation/direction with no assist  Function - Expression Expression: Verbal Expression assist level: Expresses complex ideas: With no assist  Function - Social Interaction Social Interaction assist level: Interacts appropriately with others - No medications needed.  Function - Problem Solving Problem solving assist level: Solves complex problems: Recognizes & self-corrects  Function - Memory Memory assist level: Complete Independence: No helper Patient normally able to recall (first 3 days  only): That he or she is in a hospital, Current season  Medical Problem List and Plan: 1.  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 Team conference today please see physician documentation under team conference tab, met with team face-to-face to discuss problems,progress, and goals. Formulized individual treatment plan based on medical history, underlying problem and comorbidities. 2.  DVT Prophylaxis/Anticoagulation: Subcutaneous Lovenox.  Monitor for any bleeding episodes 3. Pain Management: Tylenol as needed 4. Mood: Provide emotional support 5. Neuropsych: This patient is capable of making decisions on his own behalf. 6. Skin/Wound Care: Routine skin checks 7. Fluids/Electrolytes/Nutrition: Routine in and outs with follow-up chemistries BUN and creat creeping up likely due to poor fluid intake,  enc po fluid , recheck BMET in am Fluid intake 1268m 8/6 8.  Hypertension.  Permissive hypertension.  Benicar currently on hold. Vitals:   04/15/18 2020 04/16/18 0443  BP: (!) 143/90 (!) 146/84  Pulse: 79 70  Resp: 18 18  Temp: 98 F (36.7 C) 97.7 F (36.5 C)  SpO2: 97% 96%  in desired range 8/7 9.  Hyperlipidemia.  Lipitor    LOS (Days) 2 A FACE TO FACE EVALUATION WAS PERFORMED  ACharlett Blake8/03/2018, 7:39 AM

## 2018-04-16 NOTE — Patient Care Conference (Signed)
Inpatient RehabilitationTeam Conference and Plan of Care Update Date: 04/16/2018   Time: 10:30 AM    Patient Name: Duane Park      Medical Record Number: [id number]  Date of Birth: 01/13/1932 Sex: Male         Room/Bed: 4W15C/4W15C-01 Payor Info: Payor: MEDICARE / Plan: MEDICARE PART A AND B / Product Type: *No Product type* /    Admitting Diagnosis: FTS CVA  Admit Date/Time:  04/14/2018  4:37 PM Admission Comments: No comment available   Primary Diagnosis:  <principal problem not specified> Principal Problem: <principal problem not specified>  Patient Active Problem List   Diagnosis Date Noted  . Small vessel disease ([place]) 04/14/2018  . Diastolic dysfunction   . Stage 3 chronic kidney disease ([place])   . Aortic atherosclerosis ([place]) 04/13/2018  . Cerebral thrombosis with cerebral infarction 04/13/2018  . Cerebrovascular accident (CVA) ([place])   . Dyslipidemia   . Essential hypertension   . Gastroesophageal reflux disease   . Weakness 04/12/2018  . Chronic cough 03/28/2018  . OSTEOARTHRITIS, BACK 09/13/2009  . ARTHRITIS, KNEES, BILATERAL 09/13/2009  . 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 ABNORMALITY OF GAIT 09/13/2009    Expected Discharge Date: Expected Discharge Date: 04/19/18  Team Members Present: Physician leading conference: Dr. Alysia Penna Social Worker Present: Ovidio Kin, LCSW Nurse Present: Isla Pence, RN PT Present: Lavone Nian, PT OT Present: [name], OT SLP Present: Stormy Fabian, SLP PPS Coordinator present : Daiva Nakayama, RN, CRRN     Current Status/Progress Goal Weekly Team Focus  Medical   Left sided weakness and decreased coordination.  Permissive hypertension  Normalized blood pressure prior to discharge  Initiate rehabilitation program   Bowel/Bladder   Continent of bowel/bladder; LBM 04/15/18  Remain continent with mni assist  Assess and treat any bowel/bladder issues q shift and asw needed   Swallow/Nutrition/  Hydration             ADL's   min assist toilet and shower transfers, min assist bathing and LB dressing, supervision UB dressing  supervision bathing, bathroom transfers - Mod I dressing and grooming  ADL retraining, dynamic standing balance, LUE NMR, family education, d/c planning   Mobility   min assist without AD, steady assist with RW  supervision<>mod I overall with LRAD, min assist floor transfer  L NMR, transfers, gait, balance, pt/family education, endurance, strengthening   Communication             Safety/Cognition/ Behavioral Observations            Pain   No complain of pain  <2  Assess and treat pain q shift and as needed   Skin   No skin issue noted  Skin remain intact with min assist  Assess and address any skin issue q shift and as needed      *See Care Plan and progress notes for long and short-term goals.     Barriers to Discharge  Current Status/Progress Possible Resolutions Date Resolved   Physician    Medical stability     Initiating rehab program  See above      Nursing                  PT                    OT                  SLP  SW                Discharge Planning/Teaching Needs:  Home with wife who has been staying here with pt and observing him in therapies. Doesn't want him to leave too soon.       Team Discussion:  Goals supervision-mod.i level. Has bal;ance and endurance issues working on. Left leg and arm weak but can use functionally. Berg 32/56. Speech evaluated and does not plan to follow. Wife staying and observing in therapies. Concerned about leaving too early.  Revisions to Treatment Plan:  DC 8/10    Continued Need for Acute Rehabilitation Level of Care: The patient requires daily medical management by a physician with specialized training in physical medicine and rehabilitation for the following conditions: Daily direction of a multidisciplinary physical rehabilitation program to ensure safe treatment while  eliciting the highest outcome that is of practical value to the patient.: Yes Daily analysis of laboratory values and/or radiology reports with any subsequent need for medication adjustment of medical intervention for : Neurological problems  Duane Park, Duane Park 04/16/2018, 1:28 PM

## 2018-04-16 NOTE — Progress Notes (Signed)
Social Work Patient ID: [name], male   DOB: 09/09/1932, 84 y.o.   MRN: [id number]  Met with pt and husband to discuss team conference goals supervision-mod/i level and target discharge 8/10. Wife and pt both feel this is not enough time and contacted daughter who came on for OT session with Judson Roch, to discuss concerns and mom's abilities. Team feels pt would benefit from staying until Tuesday 8/13 due to stair issue and need for further education. MD has approved this and pt and wife feel better but feel the team needs to communicate better with the acute side of the hospital and length of stay should not be communicated prior to coming to CIR. Wife wants him to get a new PCP since they have loss faith in the current one due to feel did not order the labs he needed to have.Have encouraged wife to attend all therapy sessions to ask her questions and express her concerns. Will continue to work on discharge needs. OP is too far for them to travel, so will need to start off with home health. See daily to address wife's and pt's concerns.

## 2018-04-16 NOTE — Progress Notes (Signed)
Occupational Therapy Session Note  Patient Details  Name: ARIAN MURLEY MRN: [id number] Date of Birth: 02-03-1932  Today's Date: 04/16/2018 OT Individual Time: [phone] and 1305-1400 OT Individual Time Calculation (min): 60 min and 55 min   Short Term Goals: Week 1:  OT Short Term Goal 1 (Week 1): N/A 2/2 short ELOS  Skilled Therapeutic Interventions/Progress Updates:    1) Treatment session with focus on LB dressing, LUE ROM and strengthening, and dynamic standing balance.  Pt declined bathing this session, donned shoes with use of shoe horn.  Educated on technique for donning TEDS, with plan to attempt during future session.  Pt ambulated to Dayroom with RW with min guard.  Engaged in dynamic standing balance and weight shifting on Biodex with limits of stability, maze control, and weight shift programs.  Pt with improved weight shift when provided imagery of reaching into cabinet or to sit on toilet with anterior and posterior weight shifts.  Engaged in [place] in standing with focus on use of LUE.  Pt completed 64/66 and 74/76 in 2 mins missing 2 in upper Lt quadrant during each trial.  Discussed functional implications of reaction time and function when fatigued.  Ambulated > 100' with RW with min guard.  Completed pipe tree activity in sitting with focus on functional use of LUE.  Returned to room ambulating with RW and min guard, min cues for safety as pt picking up speed as he approached his destination thus increasing fall risk due to impulsive movements.  2) Engaged in family education with pt, pt's wife, and daughter with focus on d/c planning and addressing family's concerns.  Pt and family members concerned about proposed d/c date as they had been told 9-13 days initially.  Therapist explained pt current level and established goals with typical pattern of recovery.  [name] wife most concerned about pt navigating steps in and out of home.  Engaged in 4 steps x2 with Rt rail with pt  ascending reciprocally and cues to descend in step together pattern leading with LLE.  Pt required contact guard when ascending and min assist when descending stairs.  Encouraged pt's daughter to take pictures of grab bars and railings on stairs to further problem solve accessing home.  Discussed at length with pt and family their concerns with hope to address each concern and make a plan to address each during therapy sessions.  Pt ambulated 150' with RW supervision, min guard when navigating thresholds.  [name] daughter appreciative of therapist's time and understanding of pt progress and goals.  Pt would benefit from continued focus on high level, dynamic standing balance, including navigating obstacles to include accessing walk-in shower with 6" ledge and stairs as these will be pt's greatest challenges at home.  Therapy Documentation Precautions:  Precautions Precautions: Fall Restrictions Weight Bearing Restrictions: No Pain: Pain Assessment Pain Score: 0-No pain  See Function Navigator for Current Functional Status.   Therapy/Group: Individual Therapy  [name] 04/16/2018, 12:27 PM

## 2018-04-16 NOTE — Progress Notes (Signed)
Occupational Therapy Session Note  Patient Details  Name: PLEASANT BENSINGER MRN: [id number] Date of Birth: Sep 27, 1931  Today's Date: 04/16/2018 OT Individual Time: 8938-1017 OT Individual Time Calculation (min): 43 min    Short Term Goals: Week 1:  OT Short Term Goal 1 (Week 1): N/A 2/2 short ELOS  Skilled Therapeutic Interventions/Progress Updates:    Upon entering the room, pt supine in bed with wife present for observation. Pt performing supine >sit with supervision and donning B shoes with use of shoe horn and supervision for safety. Pt ambulating 150' with RW to ADL apartment with steady assistance. Pt taking seated rest break secondary to fatigue. OT recommended purchase of safety treads to decrease fall risk with shower when discussing shower plans at discharge. Caregiver verbalized understanding. Pt ambulating in kitchen with RW and discussed how to use counter tops, RW bag, and position self to be safe with kitchen mobility and keep both hands on RW for safety. Pt returning back to room at end of session with call bell and all needed items within reach. Wife remains present in room.   Therapy Documentation Precautions:  Precautions Precautions: Fall Restrictions Weight Bearing Restrictions: No General:   Vital Signs:  Pain:   ADL: ADL ADL Comments: See functional navigator Vision   Perception    Praxis   Exercises:   Other Treatments:    See Function Navigator for Current Functional Status.   Therapy/Group: Individual Therapy  Gypsy Decant 04/16/2018, 1:18 PM

## 2018-04-16 NOTE — Consult Note (Signed)
Neuropsychological Consultation   Patient:   Duane Park   DOB:   1931/11/26  MR Number:  [id number]  Location:  [facility] [address] [id number] [place] [address] Dept: [phone] Loc: [phone]           Date of Service:   04/16/2018  Start Time:   1 PM End Time:   2 PM  Provider/Observer:  Ilean Skill, Psy.D.       Clinical Neuropsychologist       Billing Code/Service: [phone] 4 Units  Chief Complaint:    Duane Park is an 84 year old male with history of hypertension, hyperlipidemia.  Presented 04/13/2018 with left-sided weakness.  CT scan revealed no indication f acute intracranial process.  CT angiogram of head and neck showed no emergent large vessel occlusion or stenosis.  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  Patient has continued with left sided weakness, which is improving.    Reason for Service:  Duane Park was referred for neuropsychological consultation due to coping issues and psychosocial issues about concerns of family about discharge planning and there worries and discharge date.  Below is the HPI for the current admission.  HPI: Duane Park. [place] is an 84 year old right-handed male with history of hypertension, hyperlipidemia.  Presented 04/13/2018 with left-sided weakness.  Per report, wife and patient, patient lives with spouse.  Independent prior to admission.  2 level home with bedroom on Main level and 3 steps to entry.  Wife can assist as needed.  There is a daughter in the area that works.  Cranial CT scan reviewed, unremarkable for acute intracranial process.  CT angiogram of head and neck showed no emergent large vessel occlusion or stenosis.  He did not receive TPA.  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  Echocardiogram with ejection fraction of 60% no  wall motion abnormalities grade 1 diastolic dysfunction.  Currently maintained on aspirin for CVA prophylaxis and subcutaneous Lovenox for DVT prophylaxis.  He is tolerating a regular diet.  Therapy evaluations have been completed with recommendations of physical medicine rehab consult.  Patient was admitted for a comprehensive rehab program.    Current Status:  Duane Park reports that he does continue to have some motor deficits and slowness on his right side.  He reports that he has some concerns about his ability to ambulate around his house once he is discharged.  He is worried that his wife is not going to be able to do what is needed.  However, his wife was present during this interview.  She was generally upset with the plan discharge date even though the patient has been making good progress and is reaching goals or is expected to reach goals sooner than expected.  However, limitations of help at home and the worry that she will not be able to do what is needed to get him in and out of the house or in and out of the bathroom persist.  The discharge planning has been reassessed given the concerns about what resources were there and how soon he will be ready to discharge to home.  Behavioral Observation: Duane Park  presents as a 84 y.o.-year-old Right Caucasian Male who appeared his stated age. his dress was Appropriate and he was Well Groomed and his manners were Appropriate to the situation.  his  participation was indicative of Appropriate and Attentive behaviors.  There were any physical disabilities noted.  he displayed an appropriate level of cooperation and motivation.     Interactions:    Active Redirectable  Attention:   abnormal and attention span appeared shorter than expected for age  Memory:   within normal limits; recent and remote memory intact  Visuo-spatial:  not examined  Speech (Volume):  normal  Speech:   normal; normal  Thought Process:  Coherent and  Relevant  Though Content:  WNL; not suicidal and not homicidal  Orientation:   person, place, time/date and situation  Judgment:   Fair  Planning:   Fair  Affect:    Irritable  Mood:    Irritable  Insight:   Fair  Intelligence:   normal   Medical History:   Past Medical History:  Diagnosis Date  . COPD (chronic obstructive pulmonary disease) ([place])   . Hypercholesteremia   . Hypertension      Psychiatric History:  No prior history of depression, anxiety or other psychiatric issues.  Family Med/Psych History:  Family History  Problem Relation Age of Onset  . Lung disease Mother        never smoker  . Heart disease Father     Risk of Suicide/Violence: low the patient denies any suicidal or homicidal ideation.  Impression/DX:  Duane Park is an 84 year old male with history of hypertension, hyperlipidemia.  Presented 04/13/2018 with left-sided weakness.  CT scan revealed no indication f acute intracranial process.  CT angiogram of head and neck showed no emergent large vessel occlusion or stenosis.  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  Patient has continued with left sided weakness, which is improving.    Duane Park reports that he does continue to have some motor deficits and slowness on his right side.  He reports that he has some concerns about his ability to ambulate around his house once he is discharged.  He is worried that his wife is not going to be able to do what is needed.  However, his wife was present during this interview.  She was generally upset with the plan discharge date even though the patient has been making good progress and is reaching goals or is expected to reach goals sooner than expected.  However, limitations of help at home and the worry that she will not be able to do what is needed to get him in and out of the house or in and out of the bathroom persist.  The discharge planning has been  reassessed given the concerns about what resources were there and how soon he will be ready to discharge to home.    Disposition/Plan:  Today we worked on issues related to their worries and fears about discharge planning and how the patient and his wife will manage him once he goes home.  I will see the patient again first of next week if he is still on the unit as the patient and his wife of both reported a lot of concerns about how they are to be able to manage.  Diagnosis:    Small vessel disease ([place]) - Plan: Ambulatory referral to Physical Medicine Rehab         Electronically Signed   _______________________ Ilean Skill, Psy.D.

## 2018-04-16 NOTE — Progress Notes (Signed)
Physical Therapy Session Note  Patient Details  Name: Duane Park MRN: [id number] Date of Birth: Dec 24, 1931  Today's Date: 04/16/2018 PT Individual Time: 1100-1200 PT Individual Time Calculation (min): 60 min   Short Term Goals: Week 1:  PT Short Term Goal 1 (Week 1): STG = LTG due to short ELOS.  Skilled Therapeutic Interventions/Progress Updates:   Gait to/from gym with RW, close supervision except turn to R requiring min assist, when pt's scissored feet with mild LOB.  Patient demonstrates increased fall risk as noted by score of  32 /56 on Berg Balance Scale.  (<36= high risk for falls, close to 100%; 37-45 significant >80%; 46-51 moderate >50%; 52-55 lower >25%).  PT discussed implications of Berg score with pt and wife.  Therapeutic activity to facilitte trunk shortening, lengthening,rotation while reaching out of BOS R/L in unsupported sitting.  neuromuscular re-education via forced use, multimodal cues for balance challenge and sustained stretch bil heel cords standing on wedge without UE support x 2 minutes, without LOB.  PT discussed fitness for life via strengthening/maintaining flexiblity to address balance with aging.  Gait training through cone obstacle course with RW, min guard assist on turns due to RLE adducting and scissoring slightly during turns.  RLE appears to be shorter than L.  PT placed 1/4" heel lift in R shoe.  Pt and wife stated that he has been informed previously that he appears to have LL discrepancy.  Pt left resting in wc with wife present, setting him up for lunch.     Therapy Documentation Precautions:  Precautions Precautions: Fall Restrictions Weight Bearing Restrictions: No Pain: Pain Assessment Pain Score: 0-No pain       Balance: Standardized Balance Assessment Standardized Balance Assessment: Berg Balance Test Berg Balance Test Sit to Stand: Able to stand  independently using hands Standing Unsupported: Able to stand 2 minutes  with supervision Sitting with Back Unsupported but Feet Supported on Floor or Stool: Able to sit safely and securely 2 minutes Stand to Sit: Sits safely with minimal use of hands Transfers: Able to transfer with verbal cueing and /or supervision Standing Unsupported with Eyes Closed: Able to stand 10 seconds with supervision Standing Ubsupported with Feet Together: Able to place feet together independently and stand for 1 minute with supervision From Standing, Reach Forward with Outstretched Arm: Reaches forward but needs supervision From Standing Position, Pick up Object from Floor: Able to pick up shoe, needs supervision From Standing Position, Turn to Look Behind Over each Shoulder: Needs supervision when turning Turn 360 Degrees: Needs close supervision or verbal cueing Standing Unsupported, Alternately Place Feet on Step/Stool: Able to complete >2 steps/needs minimal assist Standing Unsupported, One Foot in Front: Able to take small step independently and hold 30 seconds Standing on One Leg: Tries to lift leg/unable to hold 3 seconds but remains standing independently Total Score: 32     See Function Navigator for Current Functional Status.   Therapy/Group: Individual Therapy  Gussie Towson 04/16/2018, 12:11 PM

## 2018-04-17 ENCOUNTER — Inpatient Hospital Stay (HOSPITAL_COMMUNITY): Payer: PRIVATE HEALTH INSURANCE

## 2018-04-17 ENCOUNTER — Inpatient Hospital Stay (HOSPITAL_COMMUNITY): Payer: PRIVATE HEALTH INSURANCE | Admitting: Occupational Therapy

## 2018-04-17 ENCOUNTER — Inpatient Hospital Stay (HOSPITAL_COMMUNITY): Payer: PRIVATE HEALTH INSURANCE | Admitting: Physical Therapy

## 2018-04-17 LAB — BASIC METABOLIC PANEL
Anion gap: 9 (ref 5–15)
BUN: 21 mg/dL (ref 8–23)
CO2: 25 mmol/L (ref 22–32)
Calcium: 8.8 mg/dL — ABNORMAL LOW (ref 8.9–10.3)
Chloride: 103 mmol/L (ref 98–111)
Creatinine, Ser: 1.1 mg/dL (ref 0.61–1.24)
GFR calc Af Amer: >60 mL/min (ref >60)
GFR calc non Af Amer: >60 mL/min (ref >60)
Glucose, Bld: 88 mg/dL (ref 70–99)
Potassium: 3.9 mmol/L (ref 3.5–5.1)
Sodium: 137 mmol/L (ref 135–145)

## 2018-04-17 LAB — METHYLMALONIC ACID, SERUM: Methylmalonic Acid, Quantitative: 370 nmol/L (ref 0–378)

## 2018-04-17 NOTE — Progress Notes (Addendum)
Physical Therapy Session Note  Patient Details  Name: Duane Park MRN: [id number] Date of Birth: 1932-07-31  Today's Date: 04/17/2018 PT Individual Time: 2831-5176 PT Individual Time Calculation (min): 60 min   Short Term Goals: Week 1:  PT Short Term Goal 1 (Week 1): STG = LTG due to short ELOS.  Skilled Therapeutic Interventions/Progress Updates:   Gait training with RW on level tile to/from therapy, with close supervision. Pt requested removing small heel lift from R shoe, stating it "tilts him forward, and doesn't seem to help".  PT complied.  neuromuscular re-education via forced use, multimodal cues for alternating reciprocal pattern bil LEs seated on [place], with bil UE support> 0UE support, in sitting > standing.  Due to minimal L trunk lengthening, pt has limited trunk righting responses when shifting to R.  Resistance set at 30 cm/sec.  Discussed fall recovery and use of 911.  Pt declined performing a floor transfer to floor mat, stating "this stuff is not helping my L shoulder or back".  PT demonstrated floor transfer.  LE neuro re-ed via multimodal cues and hand-out for seated with bil UE support -R/L long arc quad knee extension with 3# wt, standing with bil UE support without ankle wts-  R/L hip abduction, mini squats, hamstring curls,  Heel/toe raises, with focus on bil eccentric control, L full ROM and smoothness of movement. Pt 's L knee became unstable during heel/toe raises and pt had to sit down.  Sustained stretch and balance challenge, standing on wedge with bil UE support fading to 0UE support, with immediate drift backward with emerging balance strategies and min assist required, x 1 minute.  Issued [place] ex hand out to pt and wife.  Pt left resting in recliner with wife present, needs at hand.     Therapy Documentation Precautions:  Precautions Precautions: Fall Restrictions Weight Bearing Restrictions: No   Pain: Pain Assessment Pain Scale:  0-10 Pain Score: 0-No pain    See Function Navigator for Current Functional Status.   Therapy/Group: Individual Therapy  Azra Abrell 04/17/2018, 10:48 AM

## 2018-04-17 NOTE — Progress Notes (Signed)
Occupational Therapy Session Note  Patient Details  Name: Duane Park MRN: [id number] Date of Birth: [birth year]  Today's Date: 04/17/2018 OT Individual Time: 1594-7076 OT Individual Time Calculation (min): 56 min    Short Term Goals: Week 1:  OT Short Term Goal 1 (Week 1): N/A 2/2 short ELOS  Skilled Therapeutic Interventions/Progress Updates:    1:1. Pt with no c/o pain and agreeable to tx. Pt ambulates throughout session with min guard and RW and VC for reach back for 50% of transfers onto various surfaces. Pt competes toileting with min guard leaning laterally for hygiene. Pt completes bathing at sit to stand level with supervision using grab bar to steady self while washing buttocks in standing. Pt dons clothing EOB with supervision at sit to stand level with RW. PT able to don teds with plastic bag over feet and A to pull bag off of foot. OT instructs pt in floor transfer with VC for rolling to L side and bringing LLE forward into lung when pivoting onto chair with MOD A overall for lifting to bring hips up to seat. Pt would benefit from continued practice however apprehensive about activity. Exited session with pt seated in recliner, call light in reach and wife present to supervise  Therapy Documentation Precautions:  Precautions Precautions: Fall Restrictions Weight Bearing Restrictions: No General:   Vital Signs:    See Function Navigator for Current Functional Status.   Therapy/Group: Individual Therapy  Tonny Branch 04/17/2018, 8:57 AM

## 2018-04-17 NOTE — Progress Notes (Signed)
Physical Therapy Session Note  Patient Details  Name: Duane Park MRN: [id number] Date of Birth: [birth year]  Today's Date: 04/17/2018 PT Individual Time:1300-1400 PT Individual Time Calculation (min): 60 min   Short Term Goals: Week 1:  PT Short Term Goal 1 (Week 1): STG = LTG due to short ELOS.  Skilled Therapeutic Interventions/Progress Updates:   Pt received sitting in WC and agreeable to PT  Gait training with RW and supervision assist to rehab gym x 122f. Gait training without AD x 1523fwith min assist from PT. Increased trendelenburg noted with increased distance on the L.   Dynamic balance training with 2 square balance task min-mod assist from PT. Forward/back ward x 10 bil. Laterally x 10 bil. Square step x3 each direction.  Dynamic gait training without AD. Forward/backward 4x 12 ft with min assist. Side stepping 3x 12 Bil with min assist.  Weave through 12 cones x 3. Moderate multimodal cues to improve L hip activation and prevent trendelenburg.  Biodex LOS x 3 with min assist from PT with cues for ankle strategy to correct LOB.   Patient returned to room and left sitting in recliner with call bell in reach and all needs met.                Therapy Documentation Precautions:  Precautions Precautions: Fall Restrictions Weight Bearing Restrictions: No    Pain: 0/10   See Function Navigator for Current Functional Status.   Therapy/Group: Individual Therapy  AuLorie Phenix/04/2018, 1:15 PM

## 2018-04-17 NOTE — Progress Notes (Signed)
Occupational Therapy Session Note  Patient Details  Name: Duane Park MRN: [id number] Date of Birth: 1932-07-02  Today's Date: 04/17/2018 OT Individual Time: 1100-1130 OT Individual Time Calculation (min): 30 min    Short Term Goals: Week 1:  OT Short Term Goal 1 (Week 1): N/A 2/2 short ELOS  Skilled Therapeutic Interventions/Progress Updates:    Pt seen for OT session focusing on neuro re ed of L UE/LE, functional standing balance/endurance and ambulation. Pt sitting up in recliner upon arrival with wife present, pt agreeable to tx session and denying pain. He ambulated to therapy gym with supervision using RW.  Completed WBing/weightshift activity in standing, R LE placed on block to promote increased WBing through L LE. From this standing position, pt required to place clothes pins along netting of overhead basketball rim to address L fine motor skills and endurance. Completed with guarding assist, x2 trials with seated rest break btwn trials, tolerating ~1-2 minutes each trial. Following seated rest break, pt completed horseshoe toss while standing on foam, non-compliant surface, min guard assist provided. He then ambulated to retrieve items from floor using RW with min A when bending down to retrieve item Pt ambulated back to room at end of session, trialed ambulation without use of AD, initially min A progressing to mod A when fatigued. Pt left seated in recliner at end of session, all needs in reach and wife present.   Therapy Documentation Precautions:  Precautions Precautions: Fall Restrictions Weight Bearing Restrictions: No Pain:   No/denies pain ADL: ADL ADL Comments: See functional navigator  See Function Navigator for Current Functional Status.   Therapy/Group: Individual Therapy  Aqeel Norgaard L 04/17/2018, 7:24 AM

## 2018-04-17 NOTE — Progress Notes (Signed)
Subjective/Complaints:  Drinking more , hx of nocturia at home ~3x per night , was up 4x last noc ROS-  Neg CP, SOB, N/V/D  Objective: Vital Signs: Blood pressure 126/69, pulse 68, temperature 98.6 F (37 C), resp. rate 14, height 5' 11"  (1.803 m), weight 93.3 kg, SpO2 96 %. No results found. Results for orders placed or performed during the hospital encounter of 04/14/18 (from the past 72 hour(s))  CBC WITH DIFFERENTIAL     Status: Abnormal   Collection Time: 04/15/18  6:15 AM  Result Value Ref Range   WBC 7.5 4.0 - 10.5 K/uL   RBC 4.54 4.22 - 5.81 MIL/uL   Hemoglobin 15.6 13.0 - 17.0 g/dL   HCT 46.5 39.0 - 52.0 %   MCV 102.4 (H) 78.0 - 100.0 fL   MCH 34.4 (H) 26.0 - 34.0 pg   MCHC 33.5 30.0 - 36.0 g/dL   RDW 12.2 11.5 - 15.5 %   Platelets 173 150 - 400 K/uL   Neutrophils Relative % 50 %   Neutro Abs 3.7 1.7 - 7.7 K/uL   Lymphocytes Relative 30 %   Lymphs Abs 2.2 0.7 - 4.0 K/uL   Monocytes Relative 12 %   Monocytes Absolute 0.9 0.1 - 1.0 K/uL   Eosinophils Relative 8 %   Eosinophils Absolute 0.6 0.0 - 0.7 K/uL   Basophils Relative 1 %   Basophils Absolute 0.0 0.0 - 0.1 K/uL   Immature Granulocytes 1 %   Abs Immature Granulocytes 0.0 0.0 - 0.1 K/uL    Comment: Performed at [facility], [address] [place], [place] [address]  Comprehensive metabolic panel     Status: Abnormal   Collection Time: 04/15/18  6:15 AM  Result Value Ref Range   Sodium 141 135 - 145 mmol/L   Potassium 4.4 3.5 - 5.1 mmol/L   Chloride 103 98 - 111 mmol/L   CO2 28 22 - 32 mmol/L   Glucose, Bld 99 70 - 99 mg/dL   BUN 29 (H) 8 - 23 mg/dL   Creatinine, Ser 1.41 (H) 0.61 - 1.24 mg/dL   Calcium 9.0 8.9 - 10.3 mg/dL   Total Protein 5.4 (L) 6.5 - 8.1 g/dL   Albumin 3.0 (L) 3.5 - 5.0 g/dL   AST 17 15 - 41 U/L   ALT 14 0 - 44 U/L   Alkaline Phosphatase 58 38 - 126 U/L   Total Bilirubin 1.3 (H) 0.3 - 1.2 mg/dL   GFR calc non Af Amer 44 (L) >60 mL/min   GFR calc Af Amer 52 (L) >60 mL/min     Comment: (NOTE) The eGFR has been calculated using the CKD EPI equation. This calculation has not been validated in all clinical situations. eGFR's persistently <60 mL/min signify possible Chronic Kidney Disease.    Anion gap 10 5 - 15    Comment: Performed at [facility] [address], [place], [place] [address]  Vitamin B12     Status: None   Collection Time: 04/15/18  8:31 AM  Result Value Ref Range   Vitamin B-12 310 180 - 914 pg/mL    Comment: (NOTE) This assay is not validated for testing neonatal or myeloproliferative syndrome specimens for Vitamin B12 levels. Performed at [facility], [place] [address], [place], [place] [address]   Basic metabolic panel     Status: Abnormal   Collection Time: 04/17/18  6:02 AM  Result Value Ref Range   Sodium 137  135 - 145 mmol/L   Potassium 3.9 3.5 - 5.1 mmol/L   Chloride 103 98 - 111 mmol/L   CO2 25 22 - 32 mmol/L   Glucose, Bld 88 70 - 99 mg/dL   BUN 21 8 - 23 mg/dL   Creatinine, Ser 1.10 0.61 - 1.24 mg/dL   Calcium 8.8 (L) 8.9 - 10.3 mg/dL   GFR calc non Af Amer >60 >60 mL/min   GFR calc Af Amer >60 >60 mL/min    Comment: (NOTE) The eGFR has been calculated using the CKD EPI equation. This calculation has not been validated in all clinical situations. eGFR's persistently <60 mL/min signify possible Chronic Kidney Disease.    Anion gap 9 5 - 15    Comment: Performed at [facility] [address] [place], [place] [address]     HEENT: mild left facial droop Cardio: RRR and no murmur Resp: CTA B/L and unlabored GI: BS positive and NT, ND Extremity:  No Edema Skin:   Intact Neuro: Alert/Oriented, Cranial Nerve Abnormalities Left central VII, Normal Sensory, Abnormal Motor 4/5 Left delt, Bi, ti grip HF, KE, 3 L ADF, Abnormal FMC Ataxic/ dec FMC and Other HOH Musc/Skel:  Other no pain with UE or LE ROM Gen NAD   Assessment/Plan: 1. 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 Physiatrist is providing close team supervision and 24 hour management of active medical problems listed below. Physiatrist and rehab team continue to assess barriers to discharge/monitor patient progress toward functional and medical goals. FIM: Function - Bathing Position: Shower Body parts bathed by patient: Right arm, Right lower leg, Left arm, Left upper leg, Chest, Abdomen, Left lower leg, Front perineal area, Buttocks, Right upper leg Body parts bathed by helper: Back Assist Level: Touching or steadying assistance(Pt > 75%)(CGA in standing)  Function- Upper Body Dressing/Undressing What is the patient wearing?: Pull over shirt/dress Pull over shirt/dress - Perfomed by patient: Thread/unthread right sleeve, Thread/unthread left sleeve, Put head through opening, Pull shirt over trunk Assist Level: Supervision or verbal cues, Set up Set up : To obtain clothing/put away Function - Lower Body Dressing/Undressing What is the patient wearing?: Shoes, Ted Hose Position: Sitting EOB Underwear - Performed by patient: Thread/unthread right underwear leg, Thread/unthread left underwear leg, Pull underwear up/down Pants- Performed by patient: Thread/unthread right pants leg, Thread/unthread left pants leg, Pull pants up/down Socks - Performed by patient: Don/doff right sock Socks - Performed by helper: Don/doff left sock Shoes - Performed by patient: Don/doff right shoe, Don/doff left shoe TED Hose - Performed by helper: Don/doff right TED hose, Don/doff left TED hose Assist for footwear: Partial/moderate assist Assist for lower body dressing: Set up, Assistive device Assistive Device Comment: shoe horn  Function - Toileting Toileting steps completed by patient: Adjust clothing prior to toileting, Adjust clothing after toileting Toileting steps completed by helper: Adjust clothing after  toileting Toileting Assistive Devices: Grab bar or rail Assist level: Touching or steadying assistance (Pt.75%)  Function - Air cabin crew transfer assistive device: Elevated toilet seat/BSC over toilet Assist level to toilet: Touching or steadying assistance (Pt > 75%) Assist level from toilet: Touching or steadying assistance (Pt > 75%)  Function - Chair/bed transfer Chair/bed transfer method: Stand pivot Chair/bed transfer assist level: Touching or steadying assistance (Pt > 75%) Chair/bed transfer assistive device: Walker Chair/bed transfer details: Verbal cues for sequencing, Verbal cues for technique, Verbal  cues for safe use of DME/AE  Function - Locomotion: Wheelchair Will patient use wheelchair at discharge?: No Wheelchair activity did not occur: N/A Wheel 50 feet with 2 turns activity did not occur: N/A Wheel 150 feet activity did not occur: N/A Function - Locomotion: Ambulation Assistive device: No device Max distance: 100 Assist level: Touching or steadying assistance (Pt > 75%) Assist level: Supervision or verbal cues Walk 50 feet with 2 turns activity did not occur: Safety/medical concerns Assist level: Touching or steadying assistance (Pt > 75%) Walk 150 feet activity did not occur: Safety/medical concerns Assist level: Touching or steadying assistance (Pt > 75%)(with RW)  Function - Comprehension Comprehension: Auditory Comprehension assistive device: Hearing aids Comprehension assist level: Follows complex conversation/direction with extra time/assistive device  Function - Expression Expression: Verbal Expression assist level: Expresses complex ideas: With extra time/assistive device  Function - Social Interaction Social Interaction assist level: Interacts appropriately with others with medication or extra time (anti-anxiety, antidepressant).  Function - Problem Solving Problem solving assist level: Solves complex problems: Recognizes &  self-corrects  Function - Memory Memory assist level: Complete Independence: No helper Patient normally able to recall (first 3 days only): That he or she is in a hospital, Current season  Medical Problem List and Plan: 1.  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 Tent d/c 8/13 2.  DVT Prophylaxis/Anticoagulation: Subcutaneous Lovenox.  Monitor for any bleeding episodes 3. Pain Management: Tylenol as needed 4. Mood: Provide emotional support 5. Neuropsych: This patient is capable of making decisions on his own behalf. 6. Skin/Wound Care: Routine skin checks 7. Fluids/Electrolytes/Nutrition: Routine in and outs with follow-up chemistries BUN and creat creeping up likely due to poor fluid intake, enc po fluid , recheck BMET improved  Fluid intake 1236m 8/6 8.  Hypertension.  Permissive hypertension.  Benicar currently on hold. Vitals:   04/16/18 2054 04/17/18 0455  BP: 134/82 126/69  Pulse: 64 68  Resp:  14  Temp:  98.6 F (37 C)  SpO2:  96%  in desired range 8/8 9.  Hyperlipidemia.  Lipitor 10.  Macrocytosis, B12 normal, denies ETOH, can f/u as outpt   LOS (Days) 3 A FACE TO FACE EVALUATION WAS PERFORMED  ACharlett Blake8/04/2018, 8:21 AM

## 2018-04-17 NOTE — IPOC Note (Signed)
Overall Plan of Care [facility]) Patient Details Name: Duane Park MRN: [id number] DOB: 05-Sep-1932  Admitting Diagnosis: <principal problem not specified>  Hospital Problems: Active Problems:   Small vessel disease ([place])     Functional Problem List: Nursing Edema, Endurance, Medication Management, Safety  PT Balance, Endurance, Motor  OT Balance, Vision, Safety, Endurance, Motor  SLP    TR         Basic ADL's: OT Grooming, Bathing, Dressing, Toileting     Advanced  ADL's: OT       Transfers: PT Bed Mobility, Bed to Chair, Car, Manufacturing systems engineer, Metallurgist: PT Ambulation, Data processing manager, Emergency planning/management officer     Additional Impairments: OT None  SLP        TR      Anticipated Outcomes Item Anticipated Outcome  Self Feeding mod I  Swallowing      Basic self-care  mod I  Toileting  (S)   Bathroom Transfers (S)  Bowel/Bladder     Transfers  supervision<>mod I with LRAD  Locomotion  supervision with LRAD  Communication     Cognition     Pain     Safety/Judgment  maintain safety with cues/reminders   Therapy Plan: PT Intensity: Minimum of 1-2 x/day ,45 to 90 minutes PT Frequency: 5 out of 7 days PT Duration Estimated Length of Stay: 5- 7 days OT Intensity: Minimum of 1-2 x/day, 45 to 90 minutes OT Frequency: 5 out of 7 days OT Duration/Estimated Length of Stay: 5-7 days      Team Interventions: Nursing Interventions Patient/Family Education, Disease Management/Prevention, Discharge Planning, Medication Management  PT interventions Ambulation/gait training, Community reintegration, DME/adaptive equipment instruction, Neuromuscular re-education, Psychosocial support, Stair training, UE/LE Strength taining/ROM, Wheelchair propulsion/positioning, Therapeutic Activities, UE/LE Coordination activities, Skin care/wound management, Pain management, Functional electrical stimulation, Discharge planning, Training and development officer, Disease  management/prevention, Functional mobility training, Patient/family education, Splinting/orthotics, Visual/perceptual remediation/compensation, Therapeutic Exercise  OT Interventions Training and development officer, Community reintegration, Disease mangement/prevention, Barrister's clerk education, Self Care/advanced ADL retraining, Neuromuscular re-education, Therapeutic Exercise, UE/LE Coordination activities, DME/adaptive equipment instruction, Discharge planning, Functional mobility training, Psychosocial support, Therapeutic Activities, UE/LE Strength taining/ROM  SLP Interventions    TR Interventions    SW/CM Interventions Discharge Planning, Psychosocial Support, Patient/Family Education   Barriers to Discharge MD  Medical stability  Nursing      PT      OT      SLP      SW       Team Discharge Planning: Destination: PT-Home ,OT- Home , SLP-  Projected Follow-up: PT-Home health PT, OT-  None, SLP-  Projected Equipment Needs: PT-Rolling walker with 5" wheels, OT- None recommended by OT, SLP-  Equipment Details: PT- , OT-  Patient/family involved in discharge planning: PT- Patient, Family member/caregiver,  OT-Patient, Family member/caregiver, SLP-   MD ELOS: 7d  Medical Rehab Prognosis:  Good Assessment:  84 year old right-handed male with history of hypertension, hyperlipidemia.  Presented 04/13/2018 with left-sided weakness.  Per report, wife and patient, patient lives with spouse.  Independent prior to admission.  2 level home with bedroom on Main level and 3 steps to entry.  Wife can assist as needed.  There is a daughter in the area that works.  Cranial CT scan reviewed, unremarkable for acute intracranial process.  CT angiogram of head and neck showed no emergent large vessel occlusion or stenosis.  He did not receive TPA.  MRI of the brain showed a 6 mm acute/early subacute infarction within  the right central cerebral peduncle.  Echocardiogram with ejection fraction of 60% no wall  motion abnormalities grade 1 diastolic dysfunction.  Currently maintained on aspirin for CVA prophylaxis and subcutaneous Lovenox for DVT prophylaxis.    Now requiring 24/7 Rehab RN,MD, as well as CIR level PT, OT .  Treatment team will focus on ADLs and mobility with goals set at  Mod I See Team Conference Notes for weekly updates to the plan of care

## 2018-04-18 ENCOUNTER — Inpatient Hospital Stay (HOSPITAL_COMMUNITY): Payer: PRIVATE HEALTH INSURANCE

## 2018-04-18 ENCOUNTER — Inpatient Hospital Stay (HOSPITAL_COMMUNITY): Payer: PRIVATE HEALTH INSURANCE | Admitting: Physical Therapy

## 2018-04-18 ENCOUNTER — Inpatient Hospital Stay (HOSPITAL_COMMUNITY): Payer: Medicare Other | Admitting: Physical Therapy

## 2018-04-18 DIAGNOSIS — R7989 Other specified abnormal findings of blood chemistry: Secondary | ICD-10-CM

## 2018-04-18 DIAGNOSIS — I10 Essential (primary) hypertension: Secondary | ICD-10-CM

## 2018-04-18 DIAGNOSIS — G8194 Hemiplegia, unspecified affecting left nondominant side: Secondary | ICD-10-CM

## 2018-04-18 NOTE — Progress Notes (Signed)
Subjective/Complaints:  Sitting in bed, wife at bedside. Very upset about "lack of staffing" at night. Had to wait an hour+ for staff last night after calling  ROS: Patient denies fever, rash, sore throat, blurred vision, nausea, vomiting, diarrhea, cough, shortness of breath or chest pain, joint or back pain, headache, or mood change.    Objective: Vital Signs: Blood pressure (!) 153/77, pulse 77, temperature 97.9 F (36.6 C), temperature source Oral, resp. rate 20, height _0  (1.803 m), weight 93.3 kg, SpO2 96 %. No results found. Results for orders placed or performed during the hospital encounter of 04/14/18 (from the past 72 hour(s))  Basic metabolic panel     Status: Abnormal   Collection Time: 04/17/18  6:02 AM  Result Value Ref Range   Sodium 137 135 - 145 mmol/L   Potassium 3.9 3.5 - 5.1 mmol/L   Chloride 103 98 - 111 mmol/L   CO2 25 22 - 32 mmol/L   Glucose, Bld 88 70 - 99 mg/dL   BUN 21 8 - 23 mg/dL   Creatinine, Ser 1.10 0.61 - 1.24 mg/dL   Calcium 8.8 (L) 8.9 - 10.3 mg/dL   GFR calc non Af Amer >60 >60 mL/min   GFR calc Af Amer >60 >60 mL/min    Comment: (NOTE) The eGFR has been calculated using the CKD EPI equation. This calculation has not been validated in all clinical situations. eGFR's persistently <60 mL/min signify possible Chronic Kidney Disease.    Anion gap 9 5 - 15    Comment: Performed at [facility] [address], [place], [place] [address]     Constitutional: No distress . Vital signs reviewed. HEENT: EOMI, oral membranes moist Neck: supple Cardiovascular: RRR without murmur. No JVD    Respiratory: CTA Bilaterally without wheezes or rales. Normal effort    GI: BS +, non-tender, non-distended  Skin:   Intact Neuro: Alert/Oriented, Cranial Nerve Abnormalities Left central VII, Normal Sensory, Abnormal Motor 4/5 Left delt, Bi, ti grip HF, KE, 3 L ADF, Abnormal FMC Ataxic/ dec FMC and Other HOH---motor exam appears stable Musc/Skel:   Other no pain with UE or LE ROM Psych: cooperative   Assessment/Plan: 1. 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 Physiatrist is providing close team supervision and 24 hour management of active medical problems listed below. Physiatrist and rehab team continue to assess barriers to discharge/monitor patient progress toward functional and medical goals. FIM: Function - Bathing Position: Shower Body parts bathed by patient: Right arm, Right lower leg, Left arm, Left upper leg, Chest, Abdomen, Left lower leg, Front perineal area, Buttocks, Right upper leg Body parts bathed by helper: Back Assist Level: Touching or steadying assistance(Pt > 75%)(CGA in standing)  Function- Upper Body Dressing/Undressing What is the patient wearing?: Pull over shirt/dress Pull over shirt/dress - Perfomed by patient: Thread/unthread right sleeve, Thread/unthread left sleeve, Put head through opening, Pull shirt over trunk Assist Level: Supervision or verbal cues, Set up Set up : To obtain clothing/put away Function - Lower Body Dressing/Undressing What is the patient wearing?: Shoes, Ted Hose Position: Sitting EOB Underwear - Performed by patient: Thread/unthread right underwear leg, Thread/unthread left underwear leg, Pull underwear up/down Pants- Performed by patient: Thread/unthread right pants leg, Thread/unthread left pants leg, Pull pants up/down Socks - Performed by patient: Don/doff right sock Socks - Performed by helper: Don/doff left sock Shoes - Performed by  patient: Don/doff right shoe, Don/doff left shoe TED Hose - Performed by helper: Don/doff right TED hose, Don/doff left TED hose Assist for footwear: Partial/moderate assist Assist for lower body dressing: Set up, Assistive device Assistive Device Comment: shoe horn  Function - Toileting Toileting steps completed  by patient: Adjust clothing prior to toileting, Adjust clothing after toileting Toileting steps completed by helper: Adjust clothing after toileting Toileting Assistive Devices: Grab bar or rail Assist level: Touching or steadying assistance (Pt.75%)  Function - Toilet Transfers Toilet transfer assistive device: Elevated toilet seat/BSC over toilet Assist level to toilet: Touching or steadying assistance (Pt > 75%) Assist level from toilet: Touching or steadying assistance (Pt > 75%)  Function - Chair/bed transfer Chair/bed transfer method: Stand pivot Chair/bed transfer assist level: Touching or steadying assistance (Pt > 75%) Chair/bed transfer assistive device: Walker Chair/bed transfer details: Verbal cues for technique, Verbal cues for safe use of DME/AE  Function - Locomotion: Wheelchair Will patient use wheelchair at discharge?: No Wheelchair activity did not occur: N/A Wheel 50 feet with 2 turns activity did not occur: N/A Wheel 150 feet activity did not occur: N/A Function - Locomotion: Ambulation Assistive device: Walker-rolling Max distance: 100 Assist level: Supervision or verbal cues Assist level: Supervision or verbal cues Walk 50 feet with 2 turns activity did not occur: Safety/medical concerns Assist level: Supervision or verbal cues Walk 150 feet activity did not occur: Safety/medical concerns Assist level: Touching or steadying assistance (Pt > 75%)(with RW)  Function - Comprehension Comprehension: Auditory Comprehension assistive device: Hearing aids Comprehension assist level: Follows complex conversation/direction with extra time/assistive device  Function - Expression Expression: Verbal Expression assist level: Expresses complex ideas: With extra time/assistive device  Function - Social Interaction Social Interaction assist level: Interacts appropriately with others with medication or extra time (anti-anxiety, antidepressant).  Function - Problem  Solving Problem solving assist level: Solves complex problems: Recognizes & self-corrects  Function - Memory Memory assist level: Complete Independence: No helper Patient normally able to recall (first 3 days only): That he or she is in a hospital, Current season  Medical Problem List and Plan: 1.  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   -continue PT, OT  -ELOS 8/13 2.  DVT Prophylaxis/Anticoagulation: Subcutaneous Lovenox.   3. Pain Management: Tylenol as needed 4. Mood: Provide emotional support 5. Neuropsych: This patient is capable of making decisions on his own behalf. 6. Skin/Wound Care: Routine skin checks 7. Fluids/Electrolytes/Nutrition: Routine in and outs with follow-up chemistries   -f/u BMET improved yesterday  -continue push fluid intake 8.  Hypertension.  Permissive hypertension.  Benicar currently on hold. Vitals:   04/17/18 1957 04/18/18 0559  BP: 131/74 (!) 153/77  Pulse: 69 77  Resp: 20 20  Temp: 98.6 F (37 C) 97.9 F (36.6 C)  SpO2: 96% 96%  in desired range 8/9 9.  Hyperlipidemia.  Lipitor 10.  Macrocytosis, B12 normal, denies ETOH, can f/u as outpt   LOS (Days) 4 A FACE TO FACE EVALUATION WAS PERFORMED  Meredith Staggers 04/18/2018, 9:31 AM

## 2018-04-18 NOTE — Progress Notes (Signed)
Physical Therapy Session Note  Patient Details  Name: Duane Park MRN: [id number] Date of Birth: 09-Apr-1932  Today's Date: 04/18/2018 PT Individual Time: [id number] and [id number] PT Individual Time Calculation (min): 58 min and 53 min   Short Term Goals: Week 1:  PT Short Term Goal 1 (Week 1): STG = LTG due to short ELOS.  Skilled Therapeutic Interventions/Progress Updates:  Treatment 1: Pt received sitting on EOB with wife Duane Park) present for session. No c/o pain reported. Assisted pt with donning ted hose for time management and pt donned shoes with set up assist. Pt & wife report concern regarding stair negotiation at home as front steps have wide rail they cannot grip. Educated pt & wife on need to use RW at all times for mobility, home modifications (remove throw rugs & any tripping hazards), and to use front entrance instead of garage entrance with modified rails/grab bars.  Pt ambulated throughout unit and outside over uneven surface (brick, concrete) with RW & supervision with therapist educating him on increased safety over uneven surfaces. Pt negotiates steps in gym, concrete steps outside of [facility], and brick steps outside of 1st floor with close supervision except min assist over brick steps with therapist providing educating for hand placement (fingers out vs gripping rail) and technique (ambulate steps laterally with BUE on L ascending rail and to allow enough room for BLE on each step) to simulate home environment of pt unable to grip rails. Educated [place] on & wife return demonstrated ability to maneuver RW up/down steps to await pt and assisting pt while negotiating steps, although wife elects to stand beside of pt instead of below him. [name] wife remains concerned regarding assisting pt down steps at home although he practiced task numerous times over various surfaces to simulate home entry during session. Wife states "if I need to call EMS to get him out of the house I will" with  therapist educating pt & wife that if something has occurred that causes pt to need that much assistance they should call EMS/go to hospital. At end of session pt left sitting on EOB with wife present to supervise.   Treatment 2: Pt received in recliner & agreeable to tx with wife present for session. No c/o pain reported. Pt ambulates throughout unit with RW & supervision. Pt utilized cybex kinetron in sitting then standing with BUE progressing to no UE support with task focusing on LE strengthening, R NMR, and dynamic balance (pt required min assist from therapist) with rest breaks taken PRN. Dynamic balance and high level gait addressed with pt side stepping and retrograde gait without UE support with steady<>min assist for balance. Supine on mat table pt performed BLE bridging with adductor hold progressing to LLE single leg bridging for LLE strengthening & NMR with cuing from therapist for proper technique. Pt performed sit<>stand with RLE elevated on step with anterior weight shift to L to focus on LLE strengthening & NMR. Pt becoming fatigued therefore transitioned to nu-step on level 4 x 10 minutes with BLE & LUE for endurance training; pt reported 6-7/10 RPE. At end of session pt left sitting in dayroom with wife present to supervise & awaiting OT arrival.  Provided pt & wife with handout regarding how to build a ramp, per their request.   Therapy Documentation Precautions:  Precautions Precautions: Fall Restrictions Weight Bearing Restrictions: No   See Function Navigator for Current Functional Status.   Therapy/Group: Individual Therapy  Duane Park 04/18/2018, 4:03 PM

## 2018-04-18 NOTE — Progress Notes (Signed)
Occupational Therapy Session Note  Patient Details  Name: Duane Park MRN: [id number] Date of Birth: 08-May-1932  Today's Date: 04/18/2018 OT Individual Time: 1333-1400 OT Individual Time Calculation (min): 27 min    Short Term Goals: Week 1:  OT Short Term Goal 1 (Week 1): N/A 2/2 short ELOS  Skilled Therapeutic Interventions/Progress Updates:    1;1. Pt received in therapy room with handoff from PT. Pt ambulates throughout sessionwith RW and supervision. OT adjusted RW to improve upright posture. Pt transfers onto standard rocking recliner as this is preferred chair at home with supervision and VC for RW managemetn. Pt completes kitchen search activity with RW bag attatched for energy conservation while retriving different items from various cabinets/heights and appliances with VC for RW positioning while reaching. Exited session with pt seated in room recliner, call light in reach and all neds met and wife present in room  Therapy Documentation Precautions:  Precautions Precautions: Fall Restrictions Weight Bearing Restrictions: No General:   Vital Signs:   See Function Navigator for Current Functional Status.   Therapy/Group: Individual Therapy  Tonny Branch 04/18/2018, 2:58 PM

## 2018-04-18 NOTE — Progress Notes (Signed)
Occupational Therapy Session Note  Patient Details  Name: TRIPTON NED MRN: [id number] Date of Birth: Nov 30, 1931  Today's Date: 04/18/2018 OT Individual Time: 3009-2330 OT Individual Time Calculation (min): 55 min    Short Term Goals: Week 1:  OT Short Term Goal 1 (Week 1): N/A 2/2 short ELOS  Skilled Therapeutic Interventions/Progress Updates:    1:1. Pt received seated on toilet and no c/o pain. Pt completes doffs pants and completes hygiene in standing with supervision. Pt completes showering in standing to simulate home environment using grab bar to stabilize intermittently. Pt with 1 LOB posteriorly and able to recover without A. Pt dresses at EOB with set up donning teds, shoes, pants, underwear and pull over shirt. Pt completes ambulation to/from gym with RW and supervision with VC for upright posture. Pt completes standing balance activities of ball toss and threading beads while standing on compliant surface at S-CGA for standing balance. Pt completes ambulation without AD and picks up cups off of floor. Exited session with pt seated in recliner and wife present to supervise.   Therapy Documentation Precautions:  Precautions Precautions: Fall Restrictions Weight Bearing Restrictions: No General:   Vital Signs:  See Function Navigator for Current Functional Status.   Therapy/Group: Individual Therapy  Tonny Branch 04/18/2018, 12:25 PM

## 2018-04-19 ENCOUNTER — Inpatient Hospital Stay (HOSPITAL_COMMUNITY): Payer: PRIVATE HEALTH INSURANCE

## 2018-04-19 ENCOUNTER — Inpatient Hospital Stay (HOSPITAL_COMMUNITY): Payer: PRIVATE HEALTH INSURANCE | Admitting: Physical Therapy

## 2018-04-19 ENCOUNTER — Inpatient Hospital Stay (HOSPITAL_COMMUNITY): Payer: Medicare Other | Admitting: Physical Therapy

## 2018-04-19 NOTE — Progress Notes (Signed)
   Subjective/Complaints:  Patient feels well.  Wife in the room.  No concerns today.  They are pleased with the care and pleased with improvement.  Objective: Vital Signs: Blood pressure 137/73, pulse 66, temperature 98.2 F (36.8 C), resp. rate 17, height 5\' 11"  (1.803 m), weight 93.3 kg, SpO2 97 %.   Elderly male in no acute distress.  He is sitting up in a chair. Chest clear to auscultation HEENT exam atraumatic, normocephalic, extractor muscles are intact neck is supple. Cardiac exam S1-S2 regular Abdominal exam overweight, active bowel sounds Extremities without edema.   Assessment/Plan: 1. 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   Continue inpatient rehab.  Tentative discharge date August 13. 2.  DVT Prophylaxis/Anticoagulation: Subcutaneous Lovenox.   3. Pain Management: Tylenol as needed 4. Mood: Provide emotional support 5. Neuropsych: This patient is capable of making decisions on his own behalf. 6. Skin/Wound Care: Routine skin checks 7. Fluids/Electrolytes/Nutrition: Basic Metabolic Panel:    Component Value Date/Time   NA 137 04/17/2018 0602   K 3.9 04/17/2018 0602   CL 103 04/17/2018 0602   CO2 25 04/17/2018 0602   BUN 21 04/17/2018 0602   CREATININE 1.10 04/17/2018 0602   GLUCOSE 88 04/17/2018 0602   CALCIUM 8.8 (L) 04/17/2018 0602    8.  Hypertension.  Permissive hypertension.  Benicar currently on hold. Vitals:   04/18/18 2037 04/19/18 0607  BP: 135/81 137/73  Pulse: 68 66  Resp: 17 17  Temp:  98.7 F (37.1 C) 98.2 F (36.8 C)  SpO2: 92% 97%   9.  Hyperlipidemia.  Lipitor 10.  Macrocytosis, without anemia.   LOS (Days) 5 A FACE TO FACE EVALUATION WAS PERFORMED  Bruce H Swords 04/19/2018, 10:46 AM

## 2018-04-19 NOTE — Progress Notes (Addendum)
Physical Therapy Session Note  Patient Details  Name: Duane Park MRN: [id number] Date of Birth: May 22, 1932  Today's Date: 04/19/2018 PT Individual Time: 5625-6389 PT Individual Time Calculation (min): 70 min   Short Term Goals: Week 1:  PT Short Term Goal 1 (Week 1): STG = LTG due to short ELOS.  Skilled Therapeutic Interventions/Progress Updates:  Pt received in recliner with wife [name]) present for session. Educated pt & wife on recommendation of using RW at all times & mod I goals in the home. Also discussed d/c from hospital this coming week. Assisted pt with donning ted hose total assist and pt dons shoes with personal shoe horn mod I. Pt ambulates throughout unit with RW & mod I. Educated pt & wife on curb negotiation with RW & pt return demonstrates task with supervision. Pt performed step ups on 6" step with BUE > 1 UE > no UE support with supervision>steady assist with task focusing on LLE strengthening & NMR and overall dynamic balance. Pt stood on rocker board with A/P and lateral challenges to balance without BUE support with eyes open>eyes closed with min assist for balance and multimodal cuing to correct with task focusing on postural control and balance; pt with posterior bias in standing. Pt engaged in ball toss while standing on wedge to promote anterior weight shift of COG. Utilized [employer] Limits of Stability (on compliant & noncompliant surface) & ball toss game (noncompliant surface) without BUE support and occasional cuing for weight shifting technique (most difficulty with anterior weight shifting) with task focusing on postural control and therapist providing supervision<>min assist. At end of session pt left sitting in recliner with wife present to supervise.   Educated pt & wife on pt's inability to drive until cleared by MD.  Therapy Documentation Precautions:  Precautions Precautions: Fall Restrictions Weight Bearing Restrictions: No   See Function Navigator  for Current Functional Status.   Therapy/Group: Individual Therapy  Waunita Schooner 04/19/2018, 9:59 AM

## 2018-04-19 NOTE — Progress Notes (Signed)
Occupational Therapy Session Note  Patient Details  Name: BAKARI NIKOLAI MRN: [id number] Date of Birth: 1932/08/03  Today's Date: 04/19/2018 OT Individual Time: 1400-1457 OT Individual Time Calculation (min): 28 min    Short Term Goals: Week 1:  OT Short Term Goal 1 (Week 1): N/A 2/2 short ELOS  Skilled Therapeutic Interventions/Progress Updates:    1;1. Pt gathers clothing with supervision using RW and VC for use of walker bag to transport items. Pt completes shower in standing for balance washing all body parts with supervision. Pt  Dries seated on toilet and dons all clothing at sit to stand level with supervision. Pt ambulates with supervision throughout hallway with RW. Pt completes 4x10 resps sit to stand from low mat with 1 round of 4" board under unilateral foot per set to imprve balance, BLE strength and endurance. Pt completes toe taps to targets standing with no AD and CGA required for when shifting weight onto LLE. Pt completes 1x15 shoulder abduction no weight, flexion/ext/elbow flex/ext/ int/ext rotation and overhead/chest press. Exited session with pt seated in recliner call light inr each and wife present.   Therapy Documentation Precautions:  Precautions Precautions: Fall Restrictions Weight Bearing Restrictions: No  See Function Navigator for Current Functional Status.   Therapy/Group: Individual Therapy  Tonny Branch 04/19/2018, 3:01 PM

## 2018-04-19 NOTE — Progress Notes (Signed)
Physical Therapy Session Note  Patient Details  Name: Duane Park MRN: [id number] Date of Birth: March 07, 1932  Today's Date: 04/19/2018 PT Individual Time: 1015-1115 PT Individual Time Calculation (min): 60 min   Short Term Goals: Week 1:  PT Short Term Goal 1 (Week 1): STG = LTG due to short ELOS.  Skilled Therapeutic Interventions/Progress Updates:    Pt received seated in recliner in room, agreeable to PT. No complaints of pain. Pt transfers and ambulates with RW throughout therapy session at Mod I level. Ambulation with RW and Supervision through obstacle course with focus on navigating tight spaces with RW, reaching outside BOS and OH for retrieval of cones, v/c for safe techniques. Ascend/descend 8 stairs with 1 handrail and min A, laterally. Trial amb 2 x 70 ft with no AD and CGA, increase in Trendelenburg on L and decreased LLE clearance without use of AD. Standing BLE therex x 10 reps each: marches, hip abd, hip ext, hip flex, HS curls, heel/toe raises. Pt left seated in recliner in room with needs in reach, wife present.  Therapy Documentation Precautions:  Precautions Precautions: Fall Restrictions Weight Bearing Restrictions: No  See Function Navigator for Current Functional Status.   Therapy/Group: Individual Therapy  Excell Seltzer, PT, DPT  04/19/2018, 4:03 PM

## 2018-04-20 ENCOUNTER — Inpatient Hospital Stay (HOSPITAL_COMMUNITY): Payer: PRIVATE HEALTH INSURANCE

## 2018-04-20 ENCOUNTER — Inpatient Hospital Stay (HOSPITAL_COMMUNITY): Payer: PRIVATE HEALTH INSURANCE | Admitting: Physical Therapy

## 2018-04-20 NOTE — Progress Notes (Signed)
   Subjective/Complaints:  Patient feels well.  Wife is in the room today.  They are both pleased with his progress.  He has no concerns.  He is sitting up in bed. Objective: Vital Signs: Blood pressure 128/78, pulse 69, temperature 98.7 F (37.1 C), temperature source Oral, resp. rate 17, height 5\' 11"  (1.803 m), weight 93.3 kg, SpO2 96 %.   Elderly male in no acute distress.  He is sitting up in bed. Chest clear to auscultation HEENT exam atraumatic, normocephalic, extractor muscles are intact neck is supple. Cardiac exam S1-S2 regular Abdominal exam overweight, active bowel sounds Extremities without edema.   Assessment/Plan: 1. 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   Continue inpatient rehab.  Tentative discharge date August 13. 2.  DVT Prophylaxis/Anticoagulation: Subcutaneous Lovenox.   3. Pain Management: Tylenol as needed 4. Mood: Provide emotional support 5. Neuropsych: This patient is capable of making decisions on his own behalf. 6. Skin/Wound Care: Routine skin checks 7. Fluids/Electrolytes/Nutrition: Basic Metabolic Panel:    Component Value Date/Time   NA 137 04/17/2018 0602   K 3.9 04/17/2018 0602   CL 103 04/17/2018 0602   CO2 25 04/17/2018 0602   BUN 21 04/17/2018 0602   CREATININE 1.10 04/17/2018 0602   GLUCOSE 88 04/17/2018 0602   CALCIUM 8.8 (L) 04/17/2018 0602    8.  Hypertension.  Adequately controlled.Cephus Richer currently on hold. Vitals:   04/19/18 2029 04/20/18 0538  BP: (!) 146/67 128/78   Pulse: 72 69  Resp: 18 17  Temp: 98.1 F (36.7 C) 98.7 F (37.1 C)  SpO2: 98% 96%   9.  Hyperlipidemia.  Lipitor 10.  Macrocytosis, without anemia.   LOS (Days) 6 A FACE TO FACE EVALUATION WAS PERFORMED  Abie Killian H Mendi Constable 04/20/2018, 9:00 AM

## 2018-04-20 NOTE — Progress Notes (Signed)
Occupational Therapy Session Note  Patient Details  Name: Duane Park MRN: [id number] Date of Birth: 1931/12/18  Today's Date: 04/20/2018 OT Individual Time: 1400-1458 OT Individual Time Calculation (min): 87 min    Short Term Goals: Week 1:  OT Short Term Goal 1 (Week 1): N/A 2/2 short ELOS  Skilled Therapeutic Interventions/Progress Updates:    1;1. Pt and wife present throughout session. No c/o pain. Pt completes window washing activity for weight bearing, closed chain exercise, BUE use through top ROM/strengthening and standing balance required for BADLs and IADLs. Pt completes 4x10 scapular push ups against high low table for scapular strengthening to improve proximal stability for smoothness of reaching. Pt completes 9HPT LUE 47 seconds improving from 54 seconds on eval. Pt completes box and blocks assessment LUE transporting 37 blocks (improving 5 from eval). Pt completes boxing activity with supervision/CGA while completeing combinations of (hook, jab and undercut) side stepping and squatting to improve BUE, coordination, and balance required for BADLs.   Therapy Documentation Precautions:  Precautions Precautions: Fall Restrictions Weight Bearing Restrictions: No General:    See Function Navigator for Current Functional Status.   Therapy/Group: Individual Therapy  Tonny Branch 04/20/2018, 3:00 PM

## 2018-04-20 NOTE — Progress Notes (Signed)
Physical Therapy Session Note  Patient Details  Name: Duane Park MRN: [id number] Date of Birth: 1931-10-08  Today's Date: 04/20/2018 PT Individual Time: 1615-1650 PT Individual Time Calculation (min): 35 min   Short Term Goals: Week 1:  PT Short Term Goal 1 (Week 1): STG = LTG due to short ELOS.  Skilled Therapeutic Interventions/Progress Updates:   Pt in recliner and agreeable to therapy, no c/o pain. Session focused on overall endurance/strengthening and community mobility. Ambulated around unit and to outside w/ RW at modified independent level. Negotiated stairs and uneven sidewalk surfaces w/ supervision for safety. Discussed instances in community where he may feel unfamiliar and uncertain on uneven surfaces and educated on safety including slowing gait speed. Returned to unit and performed kinetron in standing at 40 cm/sec for hip musculature strengthening, multiple bouts of 30 sec w/ supervision for safety. Returned to room and ended session in recliner and in care of wife, all needs met.   Therapy Documentation Precautions:  Precautions Precautions: Fall Restrictions Weight Bearing Restrictions: No Vital Signs: Therapy Vitals Temp: (!) 97.4 F (36.3 C) Temp Source: Oral Pulse Rate: 72 Resp: 19 BP: 137/69 Patient Position (if appropriate): Sitting Oxygen Therapy SpO2: 98 % O2 Device: Room Air Pain: Pain Assessment Pain Scale: 0-10 Pain Score: 0-No pain Pain Type: Acute pain Pain Location: Generalized Pain Orientation: Mid Pain Descriptors / Indicators: Aching;Discomfort Pain Intervention(s): Medication (See eMAR)  See Function Navigator for Current Functional Status.   Therapy/Group: Individual Therapy  Shaya Altamura K Arnette 04/20/2018, 4:53 PM

## 2018-04-21 ENCOUNTER — Inpatient Hospital Stay (HOSPITAL_COMMUNITY): Payer: PRIVATE HEALTH INSURANCE | Admitting: Occupational Therapy

## 2018-04-21 ENCOUNTER — Encounter (HOSPITAL_COMMUNITY): Payer: PRIVATE HEALTH INSURANCE | Admitting: Psychology

## 2018-04-21 ENCOUNTER — Inpatient Hospital Stay (HOSPITAL_COMMUNITY): Payer: PRIVATE HEALTH INSURANCE | Admitting: Physical Therapy

## 2018-04-21 LAB — CREATININE, SERUM
Creatinine, Ser: 1.2 mg/dL (ref 0.61–1.24)
GFR calc Af Amer: >60 mL/min (ref >60)
GFR calc non Af Amer: 54 mL/min — ABNORMAL LOW (ref >60)

## 2018-04-21 NOTE — Progress Notes (Signed)
Subjective/Complaints:  No issues overnite, appreciate Neuropsych notes  ROS:no CP, SOB, no N/V/D  Objective: Vital Signs: Blood pressure 124/68, pulse 61, temperature 98.2 F (36.8 C), temperature source Oral, resp. rate 18, height _0  (1.803 m), weight 93.3 kg, SpO2 98 %. No results found. Results for orders placed or performed during the hospital encounter of 04/14/18 (from the past 72 hour(s))  Creatinine, serum     Status: Abnormal   Collection Time: 04/21/18  5:27 AM  Result Value Ref Range   Creatinine, Ser 1.20 0.61 - 1.24 mg/dL   GFR calc non Af Amer 54 (L) >60 mL/min   GFR calc Af Amer >60 >60 mL/min    Comment: (NOTE) The eGFR has been calculated using the CKD EPI equation. This calculation has not been validated in all clinical situations. eGFR's persistently <60 mL/min signify possible Chronic Kidney Disease. Performed at [facility], [place] [address], [place], [place] [address]      Constitutional: No distress . Vital signs reviewed. HEENT: EOMI, oral membranes moist Neck: supple Cardiovascular: RRR without murmur. No JVD    Respiratory: CTA Bilaterally without wheezes or rales. Normal effort    GI: BS +, non-tender, non-distended  Skin:   Intact Neuro: Alert/Oriented, Cranial Nerve Abnormalities Left central VII, Normal Sensory, Abnormal Motor 4/5 Left delt, Bi, ti grip HF, KE, 3 L ADF, Abnormal FMC Ataxic/ dec FMC and Other HOH---motor exam appears stable Musc/Skel:  Other no pain with UE or LE ROM Psych: cooperative   Assessment/Plan: 1. 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 Physiatrist is providing close team supervision and 24 hour management of active medical problems listed below. Physiatrist and rehab team continue to assess barriers to discharge/monitor patient progress toward functional and medical  goals. FIM: Function - Bathing Position: Shower Body parts bathed by patient: Right arm, Right lower leg, Left arm, Left upper leg, Chest, Abdomen, Left lower leg, Front perineal area, Buttocks, Right upper leg, Back Body parts bathed by helper: Back Assist Level: Set up  Function- Upper Body Dressing/Undressing What is the patient wearing?: Pull over shirt/dress Pull over shirt/dress - Perfomed by patient: Thread/unthread right sleeve, Thread/unthread left sleeve, Put head through opening, Pull shirt over trunk Assist Level: Supervision or verbal cues, Set up Set up : To obtain clothing/put away Function - Lower Body Dressing/Undressing What is the patient wearing?: Shoes, [employer], Pants, Underwear Position: Sitting EOB Underwear - Performed by patient: Thread/unthread right underwear leg, Thread/unthread left underwear leg, Pull underwear up/down Pants- Performed by patient: Thread/unthread right pants leg, Thread/unthread left pants leg, Pull pants up/down Socks - Performed by patient: Don/doff right sock Socks - Performed by helper: Don/doff left sock Shoes - Performed by patient: Don/doff right shoe, Don/doff left shoe TED Hose - Performed by patient: Don/doff right TED hose, Don/doff left TED hose TED Hose - Performed by helper: Don/doff right TED hose, Don/doff left TED hose Assist for footwear: Setup Assist for lower body dressing: Set up, Assistive device Assistive Device Comment: shoe horn  Function - Toileting Toileting steps completed by patient: Adjust clothing prior to toileting, Performs perineal hygiene, Adjust clothing after toileting Toileting steps completed by helper: Adjust clothing after toileting Toileting Assistive Devices: Grab bar or rail Assist level: Supervision or verbal cues  Function - Air cabin crew transfer assistive device: Elevated toilet seat/BSC over toilet Assist level to toilet: Supervision or verbal  cues Assist level from toilet:  Supervision or verbal cues  Function - Chair/bed transfer Chair/bed transfer method: Ambulatory Chair/bed transfer assist level: No Help, no cues, assistive device, takes more than a reasonable amount of time Chair/bed transfer assistive device: Walker Chair/bed transfer details: Verbal cues for technique, Verbal cues for safe use of DME/AE  Function - Locomotion: Wheelchair Will patient use wheelchair at discharge?: No Wheelchair activity did not occur: N/A Wheel 50 feet with 2 turns activity did not occur: N/A Wheel 150 feet activity did not occur: N/A Function - Locomotion: Ambulation Assistive device: Walker-rolling Max distance: 150 ft  Assist level: No help, No cues, assistive device, takes more than a reasonable amount of time Assist level: No help, No cues, assistive device, takes more than a reasonable amount of time Walk 50 feet with 2 turns activity did not occur: Safety/medical concerns Assist level: No help, No cues, assistive device, takes more than a reasonable amount of time Walk 150 feet activity did not occur: Safety/medical concerns Assist level: No help, No cues, assistive device, takes more than a reasonable amount of time Assist level: No help, No cues, assistive device, takes more than a reasonable amount of time  Function - Comprehension Comprehension: Auditory Comprehension assistive device: Hearing aids Comprehension assist level: Understands complex 90% of the time/cues 10% of the time  Function - Expression Expression: Verbal Expression assist level: Expresses complex ideas: With extra time/assistive device  Function - Social Interaction Social Interaction assist level: Interacts appropriately with others - No medications needed.  Function - Problem Solving Problem solving assist level: Solves complex 90% of the time/cues < 10% of the time  Function - Memory Memory assist level: Complete Independence: No helper Patient normally able to recall  (first 3 days only): Current season, Location of own room, Staff names and faces, That he or she is in a hospital  Medical Problem List and Plan: 1.  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   -continue PT, OT  -ELOS 8/13, stable for d/c in am 2.  DVT Prophylaxis/Anticoagulation: Subcutaneous Lovenox.   3. Pain Management: Tylenol as needed 4. Mood: Provide emotional support 5. Neuropsych: This patient is capable of making decisions on his own behalf. 6. Skin/Wound Care: Routine skin checks 7. Fluids/Electrolytes/Nutrition: Routine in and outs with follow-up chemistries   -f/u BMET improved yesterday  -creatinine nl 8.  Hypertension.  Permissive hypertension.  Benicar currently on hold. Vitals:   04/20/18 2031 04/21/18 0553  BP: 137/65 124/68  Pulse: 61 61  Resp: 16 18  Temp: 98.9 F (37.2 C) 98.2 F (36.8 C)  SpO2: 96% 98%  in desired range 8/12 9.  Hyperlipidemia.  Lipitor 10.  Macrocytosis, B12 normal, denies ETOH, can f/u as outpt   LOS (Days) 7 A FACE TO FACE EVALUATION WAS PERFORMED  Charlett Blake 04/21/2018, 9:37 AM

## 2018-04-21 NOTE — Progress Notes (Signed)
Social Work Patient ID: Duane Park, male   DOB: Apr 07, 1932, 84 y.o.   MRN: [id number]  Met with pt and wife to discuss discharge tomorrow and feelings regarding this. They both feel prepared and more comfortable with this. Declined home health or OP services feel it is not needed. Discussed obtaining a new PCP and they will research this and this worker will get a list of MD's taking new patients. Work toward discharge tomorrow.

## 2018-04-21 NOTE — Progress Notes (Signed)
Social Work  Discharge Note  The overall goal for the admission was met for:   Discharge location: Yes-HOME WITH WIFE WHO CAN PROVIDE SUPERVISION LEVEL  Length of Stay: Yes-9 DAYS  Discharge activity level: Yes-MOD-I-SUPERVISION LEVEL  Home/community participation: Yes  Services provided included: MD, RD, PT, OT, RN, CM, TR, Pharmacy, Neuropsych and SW  Financial Services: Medicare and Private Insurance: [employer]  Follow-up services arranged: Other: HOME HEALTH AND OP DECLINED BY PT AND WIFE  Comments (or additional information):PT DID VERY WELL AND REACHED MOD/I LEVEL, WIFE STAYED HERE WITH HIM. WIFE AND DAUGHTER TO OBTAIN NEW PCP WANT TO RESEARCH ON OWN. GAVE THEM INFORMATION REGARDING INTERNAL MD IN [facility] TAKING NEW PATIENTS  Patient/Family verbalized understanding of follow-up arrangements: Yes  Individual responsible for coordination of the follow-up plan: SELF & WIFE  Confirmed correct DME delivered: Elease Hashimoto 04/21/2018    Elease Hashimoto

## 2018-04-21 NOTE — Discharge Summary (Signed)
Discharge summary job [id number]

## 2018-04-21 NOTE — Progress Notes (Signed)
Physical Therapy Discharge Summary  Patient Details  Name: Duane Park MRN: [id number] Date of Birth: Nov 08, 1931  Today's Date: 04/21/2018    Patient has met 8 of 8 long term goals due to improved activity tolerance, improved balance, increased strength, ability to compensate for deficits, functional use of  left upper extremity and left lower extremity, improved awareness and improved coordination.  Patient to discharge at an ambulatory level Modified Independent.   Patient's care partner is independent to provide the necessary physical and cognitive assistance at discharge.  Reasons goals not met: All goals met  Recommendation:  Patient will benefit from ongoing skilled PT services in outpatient setting to continue to advance safe functional mobility, address ongoing impairments in functional strength and balance, and minimize fall risk.  Equipment: RW ordered, recommend OP PT after discharge   Reasons for discharge: treatment goals met and discharge from hospital  Patient/family agrees with progress made and goals achieved: Yes  PT Discharge Precautions/Restrictions Precautions Precautions: Fall Restrictions Weight Bearing Restrictions: No Vital Signs   Pain Pain Assessment Pain Scale: 0-10 Pain Score: 2  Pain Type: Chronic pain Pain Location: Generalized(knees, back, R shoulder ) Pain Orientation: (B knees, R shoulder, lumbar spine ) Pain Descriptors / Indicators: Aching;Sore Pain Onset: On-going Patients Stated Pain Goal: 0 Pain Intervention(s): Repositioned;Ambulation/increased activity Vision/Perception  Vision - Assessment Eye Alignment: Within Functional Limits Ocular Range of Motion: Within Functional Limits Alignment/Gaze Preference: Within Defined Limits Tracking/Visual Pursuits: Decreased smoothness of horizontal tracking;Decreased smoothness of vertical tracking Saccades: Decreased speed of saccadic movement Convergence: Impaired - to be further tested  in functional context Diplopia Assessment: Disappears with one eye closed;Objects split on top of one another;Present in far gaze Perception Perception: Within Functional Limits Praxis Praxis: Intact  Cognition Overall Cognitive Status: Within Functional Limits for tasks assessed Arousal/Alertness: Awake/alert Orientation Level: Oriented X4 Attention: Alternating Selective Attention: Appears intact Alternating Attention: Appears intact Memory: Appears intact Awareness: Appears intact Problem Solving: Appears intact Executive Function: Reasoning;Decision Making;Sequencing;Initiating;Self Correcting;Organizing;Self Monitoring Reasoning: Appears intact Sequencing: Appears intact Organizing: Appears intact Decision Making: Appears intact Initiating: Appears intact Self Monitoring: Appears intact Self Correcting: Appears intact Safety/Judgment: Appears intact Sensation Sensation Light Touch: Appears Intact Hot/Cold: Appears Intact Proprioception: Appears Intact Stereognosis: Appears Intact Coordination Gross Motor Movements are Fluid and Coordinated: Yes Fine Motor Movements are Fluid and Coordinated: Yes Motor  Motor Motor: Hemiplegia Motor - Skilled Clinical Observations: generalized weakness  Mobility Bed Mobility Bed Mobility: Rolling Right;Rolling Left;Sit to Supine;Supine to Sit Rolling Right: Independent Rolling Left: Independent Supine to Sit: Independent Sit to Supine: Independent Transfers Transfers: Sit to Stand;Stand to Sit Sit to Stand: Independent with assistive device Stand to Sit: Independent with assistive device Stand Pivot Transfers: Independent with assistive device  Transfer (Assistive device): Rolling walker Locomotion  Gait Ambulation: Yes Gait Assistance: Independent with assistive device Gait Distance (Feet): 150 Feet Assistive device: Rolling walker Gait Gait: Yes Gait Pattern: Impaired Gait Pattern: Step-through pattern;Decreased step  length - right;Decreased step length - left;Decreased stride length;Decreased weight shift to left;Decreased trunk rotation;Trunk flexed Stairs / Additional Locomotion Stairs: Yes Stairs Assistance: Independent with assistive device Stair Management Technique: Two rails Number of Stairs: 9 Height of Stairs: (6 inch and 3 inch steps ) Ramp: Independent with assistive device Curb: Independent with assistive device Wheelchair Mobility Wheelchair Mobility: No  Trunk/Postural Assessment  Cervical Assessment Cervical Assessment: Within Functional Limits Thoracic Assessment Thoracic Assessment: Exceptions to WFL(some kyphosis ) Lumbar Assessment Lumbar Assessment: Exceptions to WFL(posterior pelvic tilt ) Postural  Control Postural Control: Within Functional Limits  Balance Balance Balance Assessed: Yes Static Standing Balance Static Standing - Balance Support: No upper extremity supported Static Standing - Level of Assistance: 5: Stand by assistance Dynamic Standing Balance Dynamic Standing - Balance Support: No upper extremity supported Dynamic Standing - Level of Assistance: 4: Min assist with no UEs; is Mod(I) with RW  Dynamic Standing - Balance Activities: Compliant surfaces Extremity Assessment  RUE Assessment RUE Assessment: Within Functional Limits LUE Assessment LUE Assessment: Exceptions to [facility] General Strength Comments: gross weakness noted  RLE Assessment RLE Assessment: Within Functional Limits LLE Assessment LLE Assessment: Exceptions to [facility] Passive Range of Motion (PROM) Comments: WFL Active Range of Motion (AROM) Comments: WFL General Strength Comments: grossly 3+ to 4-5 R LE    See Function Navigator for Current Functional Status.  Deniece Ree PT, DPT, CBIS  Supplemental Physical Therapist [facility]   Pager [phone]

## 2018-04-21 NOTE — Progress Notes (Addendum)
Physical Therapy Session Note  Patient Details  Name: Duane Park MRN: [id number] Date of Birth: October 13, 1931  Today's Date: 04/21/2018 PT Individual Time: 0903-1002 PT Individual Time Calculation (min): 59 min   Short Term Goals: Week 1:  PT Short Term Goal 1 (Week 1): STG = LTG due to short ELOS.  Skilled Therapeutic Interventions/Progress Updates:    Patient received up in chair, very pleasant and willing to participate in PT today; wife was present and observed entirety of session today as well. They report no concerns with return home at this point but would like to continue to work on strength. Continued gait training with RW, also practiced ramp, mulch, and curb navigation with RW and performed stair navigation with use of bilateral railings. Otherwise worked on Cytogeneticist for hip/knee/ankle musculature and incorporated balance training today with significant difficulty noted during dynamic tasks without UE support, patient required MinA to maintain balance in this case. Finished with exercise on Nustep x10 minutes for BLE strength and muscle endurance on level 4. Patient was returned to his room, and he and his wife were educated on Mod(I) status for the rest of the day due to upcoming discharge. He was left up in the chair with all needs met and wife present in room with him this morning.   Therapy Documentation Precautions:  Precautions Precautions: Fall Restrictions Weight Bearing Restrictions: No General: PT Amount of Missed Time (min): 1 Minutes Vital Signs:   Pain: Pain Assessment Pain Scale: 0-10 Pain Score: 2  Pain Type: Chronic pain Pain Location: Generalized(knees, back, R shoulder ) Pain Orientation: (B knees, R shoulder, lumbar spine ) Pain Descriptors / Indicators: Aching;Sore Pain Onset: On-going Patients Stated Pain Goal: 0 Pain Intervention(s): Repositioned;Ambulation/increased activity Mobility: Bed Mobility Bed Mobility: Rolling  Right;Rolling Left;Sit to Supine;Supine to Sit Rolling Right: Independent Rolling Left: Independent Supine to Sit: Independent Sit to Supine: Independent Transfers Transfers: Sit to Stand;Stand to Sit Sit to Stand: Independent with assistive device Stand to Sit: Independent with assistive device Stand Pivot Transfers: Set up assist Transfer (Assistive device): Rolling walker Locomotion : Gait Ambulation: Yes Gait Assistance: Independent with assistive device Gait Distance (Feet): 150 Feet Assistive device: Rolling walker Gait Gait: Yes Gait Pattern: Impaired Gait Pattern: Step-through pattern;Decreased step length - right;Decreased step length - left;Decreased stride length;Decreased weight shift to left;Decreased trunk rotation;Trunk flexed Stairs / Additional Locomotion Stairs: Yes Stairs Assistance: Independent with assistive device Stair Management Technique: Two rails Number of Stairs: 9 Height of Stairs: (6 inch and 3 inch steps ) Ramp: Independent with assistive device Curb: Independent with assistive device Wheelchair Mobility Wheelchair Mobility: No  Trunk/Postural Assessment : Cervical Assessment Cervical Assessment: Within Functional Limits Thoracic Assessment Thoracic Assessment: Exceptions to WFL(some kyphosis ) Lumbar Assessment Lumbar Assessment: Exceptions to WFL(posterior pelvic tilt ) Postural Control Postural Control: Within Functional Limits  Balance: Balance Balance Assessed: Yes Static Standing Balance Static Standing - Balance Support: No upper extremity supported Static Standing - Level of Assistance: 5: Stand by assistance Dynamic Standing Balance Dynamic Standing - Balance Support: No upper extremity supported Dynamic Standing - Level of Assistance: 4: Min assist Dynamic Standing - Balance Activities: Compliant surfaces Exercises:   Other Treatments:     See Function Navigator for Current Functional Status.   Therapy/Group:  Individual Therapy  Deniece Ree PT, DPT, CBIS  Supplemental Physical Therapist [facility]   Pager [phone]   04/21/2018, 11:33 AM

## 2018-04-21 NOTE — Progress Notes (Signed)
Occupational Therapy Discharge Summary  Patient Details  Name: Duane Park MRN: [id number] Date of Birth: 08-Jul-1932  Patient has met 9 of 9 long term goals due to improved activity tolerance, improved balance, postural control, ability to compensate for deficits, functional use of  LEFT upper and LEFT lower extremity, improved awareness and improved coordination.  Patient to discharge at overall Modified Independent- Supervision level.  Patient's care partner is independent to provide the necessary intermittent assistance at discharge.    Reasons goals not met: N/A  Recommendation:  Patient will benefit from ongoing skilled OT services in outpatient setting to continue to advance functional skills in the area of Reduce care partner burden and LUE NMR and strengthening.  Equipment: No equipment provided  Reasons for discharge: treatment goals met and discharge from hospital  Patient/family agrees with progress made and goals achieved: Yes  OT Discharge Precautions/Restrictions  Precautions Precautions: Fall Restrictions Weight Bearing Restrictions: No General   Vital Signs Therapy Vitals Temp: 98.1 F (36.7 C) Temp Source: Oral Pulse Rate: 72 Resp: 17 BP: 132/69 Patient Position (if appropriate): Sitting Oxygen Therapy SpO2: 98 % O2 Device: Room Air Pain Pain Assessment Pain Scale: 0-10 Pain Score: 2  Pain Type: Chronic pain Pain Location: Generalized(knees, back, R shoulder ) Pain Orientation: (B knees, R shoulder, lumbar spine ) Pain Descriptors / Indicators: Aching;Sore Pain Onset: On-going Patients Stated Pain Goal: 0 Pain Intervention(s): Repositioned;Ambulation/increased activity ADL ADL ADL Comments: See functional navigator Vision Baseline Vision/History: Wears glasses Wears Glasses: At all times Patient Visual Report: Diplopia Vision Assessment?: Yes Eye Alignment: Within Functional Limits Ocular Range of Motion: Within Functional  Limits Alignment/Gaze Preference: Within Defined Limits Tracking/Visual Pursuits: Decreased smoothness of horizontal tracking;Decreased smoothness of vertical tracking Saccades: Decreased speed of saccadic movement Convergence: Impaired - to be further tested in functional context Visual Fields: No apparent deficits Diplopia Assessment: Disappears with one eye closed;Objects split on top of one another;Present in far gaze Perception  Perception: Within Functional Limits Praxis Praxis: Intact Cognition Overall Cognitive Status: Within Functional Limits for tasks assessed Arousal/Alertness: Awake/alert Orientation Level: Oriented X4 Attention: Alternating Selective Attention: Appears intact Alternating Attention: Appears intact Memory: Appears intact Awareness: Appears intact Problem Solving: Appears intact Executive Function: Reasoning;Decision Making;Sequencing;Initiating;Self Correcting;Organizing;Self Monitoring Reasoning: Appears intact Sequencing: Appears intact Organizing: Appears intact Decision Making: Appears intact Initiating: Appears intact Self Monitoring: Appears intact Self Correcting: Appears intact Safety/Judgment: Appears intact Sensation Sensation Light Touch: Appears Intact Hot/Cold: Appears Intact Proprioception: Appears Intact Stereognosis: Appears Intact Coordination Gross Motor Movements are Fluid and Coordinated: Yes Fine Motor Movements are Fluid and Coordinated: Yes Finger Nose Finger Test: Slow but accurate on Lt, WNL on Rt Motor  Motor Motor: Hemiplegia Motor - Skilled Clinical Observations: generalized weakness Mobility  Bed Mobility Bed Mobility: Rolling Right;Rolling Left;Sit to Supine;Supine to Sit Rolling Right: Independent Rolling Left: Independent Supine to Sit: Independent Sit to Supine: Independent Transfers Sit to Stand: Independent with assistive device Stand to Sit: Independent with assistive device  Trunk/Postural  Assessment  Cervical Assessment Cervical Assessment: Within Functional Limits Thoracic Assessment Thoracic Assessment: Exceptions to WFL(some kyphosis ) Lumbar Assessment Lumbar Assessment: Exceptions to WFL(posterior pelvic tilt ) Postural Control Postural Control: Within Functional Limits  Balance Balance Balance Assessed: Yes Static Standing Balance Static Standing - Balance Support: No upper extremity supported Static Standing - Level of Assistance: 5: Stand by assistance Dynamic Standing Balance Dynamic Standing - Balance Support: No upper extremity supported Dynamic Standing - Level of Assistance: 4: Min assist Dynamic Standing -  Balance Activities: Compliant surfaces Extremity/Trunk Assessment RUE Assessment RUE Assessment: Within Functional Limits Active Range of Motion (AROM) Comments: shoulder flexion limited to 90* due to bone spurs at baseline General Strength Comments: 5/5 LUE Assessment LUE Assessment: Exceptions to [facility] Active Range of Motion (AROM) Comments: WNL General Strength Comments: grossly 4/5, loose gross grasp LUE Body System: Neuro Brunstrum levels for arm and hand: Arm;Hand Brunstrum level for arm: Stage V Relative Independence from Synergy Brunstrum level for hand: Stage VI Isolated joint movements   See Function Navigator for Current Functional Status.  Duane Park 04/21/2018, 2:36 PM

## 2018-04-21 NOTE — Progress Notes (Signed)
Occupational Therapy Session Note  Patient Details  Name: Duane Park MRN: [id number] Date of Birth: 13-Mar-1932  Today's Date: 04/21/2018 OT Individual Time: 1050-1201 and 1300-1414 OT Individual Time Calculation (min): 71 min and 74 min   Short Term Goals: Week 1:  OT Short Term Goal 1 (Week 1): N/A 2/2 short ELOS  Skilled Therapeutic Interventions/Progress Updates:    1) Treatment session with focus on functional mobility, LUE NMR, and dynamic standing balance.  Pt requested to complete shower during PM session.  Ambulated to [facility] and navigated around tables and completed transfers to booth and chairs as pt reports they go out to eat quite frequently.  Pt navigated threshold in/out of restaurant with RW supervision with min cues for safety when navigating over throw rugs at entrance and next to drink dispenser.  Pt able to maneuver narrow walkways between tables and when in gift shop with RW and supervision.  Engaged in dynamic standing activity progressing from standing on floor to wedge to further promote challenge of anterior weight shift when reaching to obtain items overhead.  Min guard during dynamic standing activity without UE support.  Alternating toe taps with min guard without UE support with decreased motor control when tapping with LLE.  Ball toss in sitting with focus on LUE motor control with pt dropping ball 30% of time.  Returned to room Mod I with RW and left upright in recliner with wife present and SWK arriving.  2) Treatment session with focus on ADL retraining and dynamic standing balance.  Pt engaged in bathing at shower level at overall supervision.  Pt ambulated around room at Mod I level with RW to gather clothing prior to shower.  Doffed clothing in standing with UE support on RW or grab bar.  Pt completed bathing in standing with 1 LOB when washing lower legs and feet in single leg stance which pt able to correct independently.  Educated on sitting when  washing lower legs and sitting when donning underwear/pants.  Pt reports having 3 in1 he can put in shower or use of built in shower seat.  Pt ambulated to Dayroom with RW Mod I.  Engaged in Wii balance activities on balance board with supervision.  Pt continues to demonstrate decreased weight shift through LLE but overall improved standing balance and activity tolerance.  Pt returned to room and pt and wife report ready for d/c tomorrow with no further questions or concerns.  Therapy Documentation Precautions:  Precautions Precautions: Fall Restrictions Weight Bearing Restrictions: No Pain: Pt with no c/o pain  See Function Navigator for Current Functional Status.   Therapy/Group: Individual Therapy  [name] 04/21/2018, 12:19 PM

## 2018-04-21 NOTE — Discharge Summary (Signed)
NAME: Duane Park, [place]CHOTEN MEDICAL RECORD ZO:[id number] ACCOUNT [id number] DATE OF BIRTH:1932-01-13 FACILITY: [facility] LOCATION: [facility] PHYSICIAN:ANDREW Letta Pate, MD  DISCHARGE SUMMARY  DATE OF DISCHARGE:  04/22/2018  DISCHARGE DIAGNOSES: 1.  Right central cerebral peduncle infarction. 2.  Subcutaneous Lovenox for deep venous thrombosis prophylaxis. 3.  Hypertension. 4.  Macrocytosis.  HOSPITAL COURSE:  An 84 year old right-handed male with history of hypertension,  hyperlipidemia, presented 04/13/2018 with left-sided weakness.  He lives with his spouse, independent prior to admission.  Cranial CT scan unremarkable for acute process.   CT angiogram of head and neck showed no emergent large vessel occlusion or stenosis.  He did not receive tPA.  MRI of the brain showed a 6 mm acute early subacute infarction within the right central cerebral peduncle.  Echocardiogram with ejection  fraction of 60%.  No wall motion abnormalities.  Grade I diastolic dysfunction.  Maintained on aspirin for CVA prophylaxis, subcutaneous Lovenox for DVT prophylaxis.  The patient was admitted for a comprehensive rehabilitation program.  PAST MEDICAL HISTORY:  See discharge diagnoses.  SOCIAL HISTORY:  Lives with spouse, independent prior to admission.  FUNCTIONAL STATUS:  Upon admission to rehab services was moderate assist with stand pivot transfers, minimal guard sit to stand, min mod assist with activities of daily living.  PHYSICAL EXAMINATION: VITAL SIGNS:  Blood pressure 127/77, pulse 73, temperature 98, respirations 18. GENERAL:  Alert male in no acute distress.  Mild left facial weakness.  EOMs intact. NECK:  Supple, nontender, no JVD. CARDIOVASCULAR:  Rate controlled. ABDOMEN:  Soft, nontender, good bowel sounds. LUNGS:  Clear to auscultation without wheeze.  REHABILITATION HOSPITAL COURSE:  The patient was admitted to inpatient rehab services.  Therapy was initiated on a 3-hour daily basis, consisting  of physical therapy, occupational therapy and rehabilitation nursing.  The following issues were addressed  during patient's rehabilitation stay.  Pertaining to the patient's right central cerebral peduncle infarction, remained stable, maintained on aspirin therapy.  He would follow up with neurology services.  Subcutaneous Lovenox for DVT prophylaxis.  No  bleeding episodes.  Permissive hypertension.  Monitor closely.  Hyperlipidemia with the use of Lipitor.  History of macrocytosis.  Follow up as outpatient.  The patient received weekly collaborative interdisciplinary team conferences to discuss estimated  length of stay, family teaching, any barriers to discharge.  Ambulates around the unit and outside.  Rolling walker at modified independent level.  Negotiating stairs supervision.  Gathers belongings for activities of daily living and homemaking.  He  was advised no driving.  He was on a regular diet.  Discharged 04/22/2018.  DISCHARGE MEDICATIONS:  Included aspirin 81 mg p.o. daily, Lipitor 80 mg p.o. daily, Protonix 80 mg p.o. daily, Metamucil 1 packet daily, Tylenol as needed.  DIET:  Regular.  FOLLOWUP:  Dr. Alysia Penna at the outpatient rehab center as directed.  Dr. Erlinda Hong, neurology services.  Call for appointment; Dr. Greig Right, medical management.  SPECIAL INSTRUCTIONS:  No driving.  LN/NUANCE D:04/21/2018 T:04/21/2018 JOB:[id number]

## 2018-04-21 NOTE — Consult Note (Signed)
Neuropsychological Consultation   Patient:   Duane Park   DOB:   03-08-1932  MR Number:  [id number]  Location:  [facility] [id number] [place] [address] Dept: [place]: [phone]           Date of Service:   04/21/2018  Start Time:   8 AM End Time:   9 AM  Provider/Observer:  Ilean Skill, Psy.D.       Clinical Neuropsychologist       Billing Code/Service: [phone] 4 units  Chief Complaint:    Duane Park is an 84 year old male with history of hypertension, hyperlipidemia.  Presented 04/13/2018 with left-sided weakness.  CT scan revealed no indication f acute intracranial process.  CT angiogram of head and neck showed no emergent large vessel occlusion or stenosis.  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  Patient has continued with left sided weakness, which is improving.  Patient's chief complaint has been reviewed for this visit.  The patient has shown significant improvement and is ready for discharge tomorrow.    Reason for Service:  Duane Park was referred for neuropsychological consultation due to coping issues and psychosocial issues about concerns of family about discharge planning and there worries and discharge date.  Below is the HPI for the current admission.  HPI: Duane Park is an 84 year old right-handed male with history of hypertension, hyperlipidemia.  Presented 04/13/2018 with left-sided weakness.  Per report, wife and patient, patient lives with spouse.  Independent prior to admission.  2 level home with bedroom on Main level and 3 steps to entry.  Wife can assist as needed.  There is a daughter in the area that works.  Cranial CT scan reviewed, unremarkable for acute intracranial process.  CT angiogram of head and neck showed no emergent large vessel occlusion or stenosis.  He did not receive TPA.  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  Echocardiogram with ejection fraction of 60% no wall motion abnormalities grade 1 diastolic dysfunction.  Currently maintained on aspirin for CVA prophylaxis and subcutaneous Lovenox for DVT prophylaxis.  He is tolerating a regular diet.  Therapy evaluations have been completed with recommendations of physical medicine rehab consult.  Patient was admitted for a comprehensive rehab program.    Current Status:  Duane Park and his wife report significant improvement since I saw last week.  This is confirmed through chart review and speaking with members of treatment team.  Patient reports mood is good.  Wife reports that she is feeling much better about assisting him in and out of house and on daily living issues with level of motor function that patient has achieved.  Patient and wife deny any significant changes in cogitative functioning.  Some vision changes but these were present prior to current CVA.  Behavioral Observation: Duane Park  presents as a 83 y.o.-year-old Right Caucasian Male who appeared his stated age. his dress was Appropriate and he was Well Groomed and his manners were Appropriate to the situation.  his participation was indicative of Appropriate and Attentive behaviors.  There were any physical disabilities noted.  he displayed an appropriate level of cooperation and motivation.     Interactions:    Active Attentive  Attention:   within normal limits and attention span appeared shorter than expected for age  Memory:   within normal limits; recent and remote memory intact  Visuo-spatial:  not examined  Speech (Volume):  normal  Speech:   normal; normal  Thought Process:  Coherent and Relevant  Though Content:  WNL; not suicidal and not homicidal  Orientation:   person, place, time/date and  situation  Judgment:   Good  Planning:   Good  Affect:    Appropriate  Mood:    NA and Anxious  Insight:   Good  Intelligence:   normal   Medical History:   Past Medical History:  Diagnosis Date  . COPD (chronic obstructive pulmonary disease) ([place])   . Hypercholesteremia   . Hypertension      Psychiatric History:  No prior history of depression, anxiety or other psychiatric issues.  Family Med/Psych History:  Family History  Problem Relation Age of Onset  . Lung disease Mother        never smoker  . Heart disease Father     Risk of Suicide/Violence: low the patient denies any suicidal or homicidal ideation.  Impression/DX:  Duane Park is an 84 year old male with history of hypertension, hyperlipidemia.  Presented 04/13/2018 with left-sided weakness.  CT scan revealed no indication f acute intracranial process.  CT angiogram of head and neck showed no emergent large vessel occlusion or stenosis.  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  Patient has continued with left sided weakness, which is improving.    Duane Park and his wife report significant improvement since I saw last week.  This is confirmed through chart review and speaking with members of treatment team.  Patient reports mood is good.  Wife reports that she is feeling much better about assisting him in and out of house and on daily living issues with level of motor function that patient has achieved.  Patient and wife deny any significant changes in cogitative functioning.  Some vision changes but these were present prior to current CVA.   Diagnosis:    Small vessel disease ([place]) - Plan: Ambulatory referral to Physical Medicine Rehab         Electronically Signed   _______________________ Ilean Skill, Psy.D.

## 2018-04-22 DIAGNOSIS — R269 Unspecified abnormalities of gait and mobility: Secondary | ICD-10-CM

## 2018-04-22 DIAGNOSIS — I69398 Other sequelae of cerebral infarction: Secondary | ICD-10-CM

## 2018-04-22 MED ORDER — PANTOPRAZOLE SODIUM 40 MG PO TBEC: 80.0000 mg | DELAYED_RELEASE_TABLET | Freq: Every day | ORAL | 3 refills | Status: AC

## 2018-04-22 MED ORDER — PANTOPRAZOLE SODIUM 40 MG PO TBEC: 80.0000 mg | DELAYED_RELEASE_TABLET | Freq: Every day | ORAL | 3 refills | Status: DC

## 2018-04-22 MED ORDER — ACETAMINOPHEN 325 MG PO TABS: 325.0000 mg | ORAL_TABLET | ORAL | Status: AC | PRN

## 2018-04-22 MED ORDER — ATORVASTATIN CALCIUM 80 MG PO TABS: 80.0000 mg | ORAL_TABLET | Freq: Every day | ORAL | 3 refills | Status: AC

## 2018-04-22 NOTE — Discharge Instructions (Signed)
Inpatient Rehab Discharge Instructions  [facility] Discharge date and time: No discharge date for patient encounter.   Activities/Precautions/ Functional Status: Activity: activity as tolerated Diet: regular diet Wound Care: none needed Functional status:  ___ No restrictions     ___ Walk up steps independently ___ 24/7 supervision/assistance   ___ Walk up steps with assistance ___ Intermittent supervision/assistance  ___ Bathe/dress independently ___ Walk with walker     _x__ Bathe/dress with assistance ___ Walk Independently    ___ Shower independently ___ Walk with assistance    ___ Shower with assistance ___ No alcohol     ___ Return to work/school ________  Special Instructions: No driving    COMMUNITY REFERRALS UPON DISCHARGE:   OUTPATIENT REHAB-PT & OT [facility] [phone] WILL CONTACT DAUGHTER TO ARRANGE APPOINTMENTS.  Medical Equipment/Items Ordered:NONE NEEDED    STROKE/TIA DISCHARGE INSTRUCTIONS SMOKING Cigarette smoking nearly doubles your risk of having a stroke & is the single most alterable risk factor  If you smoke or have smoked in the last 12 months, you are advised to quit smoking for your health.  Most of the excess cardiovascular risk related to smoking disappears within a year of stopping.  Ask you doctor about anti-smoking medications  [facility] Quit Line: 1-800-QUIT NOW  Free Smoking Cessation Classes [phone]  CHOLESTEROL Know your levels; limit fat & cholesterol in your diet  Lipid Panel     Component Value Date/Time   CHOL 188 04/13/2018 0507   TRIG 160 (H) 04/13/2018 0507   HDL 30 (L) 04/13/2018 0507   CHOLHDL 6.3 04/13/2018 0507   VLDL 32 04/13/2018 0507   LDLCALC 126 (H) 04/13/2018 0507      Many patients benefit from treatment even if their cholesterol is at goal.  Goal: Total Cholesterol (CHOL) less than 160  Goal:  Triglycerides (TRIG) less than 150  Goal:  HDL greater than 40  Goal:  LDL (LDLCALC)  less than 100   BLOOD PRESSURE American Stroke Association blood pressure target is less that 120/80 mm/Hg  Your discharge blood pressure is:  BP: (!) 142/78  Monitor your blood pressure  Limit your salt and alcohol intake  Many individuals will require more than one medication for high blood pressure  DIABETES (A1c is a blood sugar average for last 3 months) Goal HGBA1c is under 7% (HBGA1c is blood sugar average for last 3 months)  Diabetes: No known diagnosis of diabetes    Lab Results  Component Value Date   HGBA1C 5.3 04/12/2018     Your HGBA1c can be lowered with medications, healthy diet, and exercise.  Check your blood sugar as directed by your physician  Call your physician if you experience unexplained or low blood sugars.  PHYSICAL ACTIVITY/REHABILITATION Goal is 30 minutes at least 4 days per week  Activity: Increase activity slowly, Therapies: Physical Therapy: Home Health Return to work:   Activity decreases your risk of heart attack and stroke and makes your heart stronger.  It helps control your weight and blood pressure; helps you relax and can improve your mood.  Participate in a regular exercise program.  Talk with your doctor about the best form of exercise for you (dancing, walking, swimming, cycling).  DIET/WEIGHT Goal is to maintain a healthy weight  Your discharge diet is:  Diet Order           Diet Heart Room service appropriate? Yes; Fluid consistency: Thin  Diet effective now  liquids Your height is:  Height: 5\' 11"  (180.3 cm) Your current weight is:   Your Body Mass Index (BMI) is:     Following the type of diet specifically designed for you will help prevent another stroke.  Your goal weight range is:    Your goal Body Mass Index (BMI) is 19-24.  Healthy food habits can help reduce 3 risk factors for stroke:  High cholesterol, hypertension, and excess weight.  RESOURCES Stroke/Support Group:  Call [phone]   STROKE EDUCATION  PROVIDED/REVIEWED AND GIVEN TO PATIENT Stroke warning signs and symptoms How to activate emergency medical system (call 911). Medications prescribed at discharge. Need for follow-up after discharge. Personal risk factors for stroke. Pneumonia vaccine given:  Flu vaccine given:  My questions have been answered, the writing is legible, and I understand these instructions.  I will adhere to these goals & educational materials that have been provided to me after my discharge from the hospital.      My questions have been answered and I understand these instructions. I will adhere to these goals and the provided educational materials after my discharge from the hospital.  Patient/Caregiver Signature _______________________________ Date __________  Clinician Signature _______________________________________ Date __________  Please bring this form and your medication list with you to all your follow-up doctor's appointments.

## 2018-04-22 NOTE — Progress Notes (Signed)
Subjective/Complaints:  t feels ready to go home today  ROS:no CP, SOB, no N/V/D  Objective: Vital Signs: Blood pressure 139/67, pulse 60, temperature 98.4 F (36.9 C), temperature source Oral, resp. rate 17, height _0  (1.803 m), weight 93.3 kg, SpO2 98 %. No results found. Results for orders placed or performed during the hospital encounter of 04/14/18 (from the past 72 hour(s))  Creatinine, serum     Status: Abnormal   Collection Time: 04/21/18  5:27 AM  Result Value Ref Range   Creatinine, Ser 1.20 0.61 - 1.24 mg/dL   GFR calc non Af Amer 54 (L) >60 mL/min   GFR calc Af Amer >60 >60 mL/min    Comment: (NOTE) The eGFR has been calculated using the CKD EPI equation. This calculation has not been validated in all clinical situations. eGFR's persistently <60 mL/min signify possible Chronic Kidney Disease. Performed at [facility], [place] [address], [place], [place] [address]      Constitutional: No distress . Vital signs reviewed. HEENT: EOMI, oral membranes moist Neck: supple Cardiovascular: RRR without murmur. No JVD    Respiratory: CTA Bilaterally without wheezes or rales. Normal effort    GI: BS +, non-tender, non-distended  Skin:   Intact Neuro: Alert/Oriented, Cranial Nerve Abnormalities Left central VII, Normal Sensory, Abnormal Motor 4/5 Left delt, Bi, ti grip HF, KE, 3 L ADF, Abnormal FMC Ataxic/ dec FMC and Other HOH---motor exam appears stable Musc/Skel:  Other no pain with UE or LE ROM Psych: cooperative   Assessment/Plan: 1. Functional deficits secondary to Right cerebral peduncle infarct  Stable for D/C today F/u PCP in 3-4 weeks F/u PM&R 2 weeks See D/C summary See D/C instructions FIM: Function - Bathing Position: Shower Body parts bathed by patient: Right arm, Right lower leg, Left arm, Left upper leg, Chest, Abdomen, Left lower leg, Front perineal area, Buttocks, Right upper leg, Back Body parts bathed by helper: Back Assist Level: Set  up  Function- Upper Body Dressing/Undressing What is the patient wearing?: Pull over shirt/dress Pull over shirt/dress - Perfomed by patient: Thread/unthread right sleeve, Thread/unthread left sleeve, Put head through opening, Pull shirt over trunk Assist Level: No help, No cues Set up : To obtain clothing/put away Function - Lower Body Dressing/Undressing What is the patient wearing?: Underwear, Pants, Socks, Shoes Position: (recliner) Underwear - Performed by patient: Thread/unthread right underwear leg, Thread/unthread left underwear leg, Pull underwear up/down Pants- Performed by patient: Thread/unthread right pants leg, Thread/unthread left pants leg, Pull pants up/down Socks - Performed by patient: Don/doff right sock, Don/doff left sock Socks - Performed by helper: Don/doff left sock Shoes - Performed by patient: Don/doff right shoe, Don/doff left shoe TED Hose - Performed by patient: Don/doff right TED hose, Don/doff left TED hose TED Hose - Performed by helper: Don/doff right TED hose, Don/doff left TED hose Assist for footwear: Independent Assist for lower body dressing: Assistive device Assistive Device Comment: shoe horn  Function - Toileting Toileting steps completed by patient: Adjust clothing prior to toileting, Performs perineal hygiene, Adjust clothing after toileting Toileting steps completed by helper: Adjust clothing after toileting Toileting Assistive Devices: Grab bar or rail Assist level: No help/no cues  Function Midwife transfer assistive device: Walker Assist level to toilet: No Help, no cues, assistive device, takes more than a reasonable amount of time Assist level from toilet: No Help, no cues, assistive device, takes more than a reasonable amount of time  Function - Chair/bed transfer Chair/bed  transfer method: Ambulatory Chair/bed transfer assist level: No Help, no cues, assistive device, takes more than a reasonable amount of  time Chair/bed transfer assistive device: Walker Chair/bed transfer details: Verbal cues for technique, Verbal cues for safe use of DME/AE  Function - Locomotion: Wheelchair Will patient use wheelchair at discharge?: No Wheelchair activity did not occur: N/A Wheel 50 feet with 2 turns activity did not occur: N/A Wheel 150 feet activity did not occur: N/A Function - Locomotion: Ambulation Assistive device: Walker-rolling Max distance: 150 Assist level: No help, No cues, assistive device, takes more than a reasonable amount of time Assist level: No help, No cues, assistive device, takes more than a reasonable amount of time Walk 50 feet with 2 turns activity did not occur: Safety/medical concerns Assist level: No help, No cues, assistive device, takes more than a reasonable amount of time Walk 150 feet activity did not occur: Safety/medical concerns Assist level: No help, No cues, assistive device, takes more than a reasonable amount of time Assist level: No help, No cues, assistive device, takes more than a reasonable amount of time  Function - Comprehension Comprehension: Auditory Comprehension assistive device: Hearing aids Comprehension assist level: Understands complex 90% of the time/cues 10% of the time  Function - Expression Expression: Verbal Expression assist level: Expresses complex ideas: With extra time/assistive device  Function - Social Interaction Social Interaction assist level: Interacts appropriately with others - No medications needed.  Function - Problem Solving Problem solving assist level: Solves complex 90% of the time/cues < 10% of the time  Function - Memory Memory assist level: Complete Independence: No helper Patient normally able to recall (first 3 days only): Current season, Location of own room, Staff names and faces, That he or she is in a hospital  Medical Problem List and Plan: 1.  Left side weakness secondary to right central cerebral peduncle  infarct.   HHPT,OT  stable for d/C 2.  DVT Prophylaxis/Anticoagulation: Subcutaneous Lovenox.   3. Pain Management: Tylenol as needed 4. Mood: Provide emotional support 5. Neuropsych: This patient is capable of making decisions on his own behalf. 6. Skin/Wound Care: Routine skin checks 7. Fluids/Electrolytes/Nutrition:stable   8.  Hypertension.  Permissive hypertension.  Benicar currently on hold. Vitals:   04/21/18 2002 04/22/18 0625  BP: 132/71 139/67  Pulse: 65 60  Resp: 18 17  Temp: 98.3 F (36.8 C) 98.4 F (36.9 C)  SpO2: 98% 98%  in desired range 8/13 9.  Hyperlipidemia.  Lipitor 10.  Macrocytosis, B12 normal, denies ETOH, can f/u as outpt   LOS (Days) 8 A FACE TO FACE EVALUATION WAS PERFORMED  Charlett Blake 04/22/2018, 10:59 AM

## 2018-04-22 NOTE — Progress Notes (Signed)
Social Work Patient ID: Duane Park, male   DOB: 01-12-1932, 84 y.o.   MRN: [id number] Met with pt, daughter and wife and they report they did not decline OP services. They do want him to have follow up therapies. Discussed the options and they chose [facility]. Will make referral and per wife's request they will call daughter to set up appointments. All in agreement with the plan.

## 2018-04-22 NOTE — Accreditation Note (Signed)
Patient is scheduled for discharge today. Discharge orders have been place. Discharge Education provided to Patient and Spouse by PA. Upon discharge Patient is requesting a leg bag. Leg bag applied with Spouse verbalizing understanding of hygiene and emptying of leg bag.

## 2018-04-23 ENCOUNTER — Ambulatory Visit: Payer: Medicare Other | Admitting: Occupational Therapy

## 2018-04-23 ENCOUNTER — Ambulatory Visit: Payer: Medicare Other | Attending: Physical Medicine & Rehabilitation | Admitting: Physical Therapy

## 2018-04-23 ENCOUNTER — Encounter: Payer: Self-pay | Admitting: Physical Therapy

## 2018-04-23 DIAGNOSIS — R2681 Unsteadiness on feet: Secondary | ICD-10-CM

## 2018-04-23 DIAGNOSIS — M6281 Muscle weakness (generalized): Secondary | ICD-10-CM | POA: Diagnosis not present

## 2018-04-23 DIAGNOSIS — R262 Difficulty in walking, not elsewhere classified: Secondary | ICD-10-CM | POA: Insufficient documentation

## 2018-04-23 DIAGNOSIS — R278 Other lack of coordination: Secondary | ICD-10-CM

## 2018-04-23 DIAGNOSIS — I69354 Hemiplegia and hemiparesis following cerebral infarction affecting left non-dominant side: Secondary | ICD-10-CM | POA: Insufficient documentation

## 2018-04-23 NOTE — Therapy (Signed)
[facility] [address] [place], [place], [address] Phone: [phone]   Fax:  [phone]  Occupational Therapy Evaluation  Patient Details  Name: Duane Park MRN: [id number] Date of Birth: 04-01-1932 Referring Provider: Dr. Alysia Penna   Encounter Date: 04/23/2018  OT End of Session - 04/23/18 1349    Visit Number  1    Number of Visits  9    Date for OT Re-Evaluation  05/24/18    Authorization Type  MCR    Authorization - Visit Number  1    Authorization - Number of Visits  10    OT Start Time  1230    OT Stop Time  1315    OT Time Calculation (min)  45 min    Activity Tolerance  Patient tolerated treatment well    Behavior During Therapy  [facility] for tasks assessed/performed       Past Medical History:  Diagnosis Date  . COPD (chronic obstructive pulmonary disease) ([place])   . Hypercholesteremia   . Hypertension     Past Surgical History:  Procedure Laterality Date  . BACK SURGERY    . JOINT REPLACEMENT     arthroscopic surgery rt side    There were no vitals filed for this visit.  Subjective Assessment - 04/23/18 1237    Patient is accompained by:  Family member   wife   Pertinent History  HTN, HLD, HOH, OA lower back and bilateral knees, Back surgery 2011, Rt shoulder surgery to remove bone spurs 2012    Limitations  no driving    Currently in Pain?  Yes   O.T. will not be addressing directly d/t chronic in nature (premorbid)    Pain Location  Generalized   Low back, Rt shoulder, both knees chronic prior to stroke   Pain Descriptors / Indicators  Aching    Pain Type  Chronic pain    Pain Onset  More than a month ago    Pain Frequency  Intermittent    Aggravating Factors   standing     Pain Relieving Factors  pain meds, ice        [facility] OT Assessment - 04/23/18 0001      Assessment   Medical Diagnosis  Rt CVA    Referring Provider  Dr. Alysia Penna    Onset Date/Surgical Date   04/12/18    Hand Dominance  Right    Prior Therapy  CIR d/c 04/22/18      Precautions   Precautions  Fall    Precaution Comments  no driving      Restrictions   Weight Bearing Restrictions  No      Balance Screen   Has the patient fallen in the past 6 months  No    Has the patient had a decrease in activity level because of a fear of falling?   --   caution   Is the patient reluctant to leave their home because of a fear of falling?   No      Home  Environment   Astronomer;Door    Additional Comments  Pt lives in 2 story home, but stays on main level. Pt has 3-5 steps to enter. DME: 4W walker, [employer]    Lives With  Spouse      Prior Function   Level of Independence  Independent    Vocation  Retired      ADL   Eating/Feeding  Modified independent   Has not yet attempted cutting food   Grooming  Modified independent    Upper Body Bathing  Supervision/safety    Lower Body Bathing  Supervision/safety    Upper Body Dressing  Increased time    Lower Body Dressing  Modified independent   wears slip on shoes   Toilet Transfer  Independent    Tub/Shower Transfer  Supervision/safety      IADL   Shopping  --   has not yet attempted since d/c from hospital   Light Housekeeping  --   has not yet attempted   Meal Prep  --   [name] wife did majority of cooking, pt grilled - has not attempted since d/c   Community Mobility  Relies on family or friends for transportation    Medication Management  Takes responsibility if medication is prepared in advance in seperate dosage    Financial Management  --   wife always did     Mobility   Mobility Status Comments  walks w/ walker      Written Expression   Dominant Hand  Right    Handwriting  --   denies changes     Vision - History   Baseline Vision  Wears glasses all the time    Visual History  --   cataract surgery   Additional Comments  pt has diplopia at a certain level from 3rd nerve palsy in 2001, but no  changes from stroke      Cognition   Cognition Comments  Pt appears WFL's, delayed recall 2/3 - possible mild memory deficits      Observation/Other Assessments   Observations  Pt has OA bilateral hands, worse at index and long fingers which limit ROM slightly      Sensation   Light Touch  Appears Intact      Coordination   9 Hole Peg Test  Right;Left    Right 9 Hole Peg Test  36.56 sec    Left 9 Hole Peg Test  46.57 sec      Edema   Edema  none      ROM / Strength   AROM / PROM / Strength  AROM;Strength      AROM   Overall AROM Comments  RUE AROM WFL's w/ decreased end range (premorbid). LUE AROM WFL's. (finger ROM bilaterally impaired d/t OA)       Strength   Overall Strength Comments  RUE MMT sh. flex 4+/5, abd 3+/5 (due to premorbid sh. injuries), LUE MMT sh flex 4/5, abd 3+/5      Hand Function   Right Hand Grip (lbs)  65    Left Hand Grip (lbs)  45                           OT Long Term Goals - 04/23/18 1354      OT LONG TERM GOAL #1   Title  Independent with HEP for coordination, strength LUE - 05/24/18    Time  4    Period  Weeks    Status  New      OT LONG TERM GOAL #2   Title  Pt to perform dynamic standing tasks for 10 min. w/o LOB     Time  4    Period  Weeks    Status  New      OT LONG TERM GOAL #3   Title  Pt to perform simple  snack prep at mod I level using DME prn    Time  4    Period  Weeks    Status  New      OT LONG TERM GOAL #4   Title  Improve Lt grip strength to 52 lbs or greater    Baseline  eval: 45 lbs (Rt = 65 lbs)    Time  4    Period  Weeks    Status  New      OT LONG TERM GOAL #5   Title  Pt to improve coordination Lt hand as evidenced by performing 9 hole peg test in 40 sec. or under    Baseline  eval: 46.57 sec    Time  4    Period  Weeks    Status  New            Plan - 04/23/18 1350    Clinical Impression Statement  Pt is an 84 y.o. male s/p Rt CVA on 04/12/18 who presents to O.T. w/ mildly  decreased strength and coordination LUE, and unsteadiness on feet impacting ability to return to prior IADLS.     Occupational Profile and client history currently impacting functional performance  PMH: HTN, HLD, HOH, OA back/knees/hands, DDD lower back, premorbid Rt shoulder deficits, back surgery 2011, Rt shoulder sx 2012    Occupational performance deficits (Please refer to evaluation for details):  IADL's;Social Participation    Rehab Potential  Good    OT Frequency  2x / week    OT Duration  4 weeks   pluse eval   OT Treatment/Interventions  Self-care/ADL training;Moist Heat;DME and/or AE instruction;Therapeutic activities;Cognitive remediation/compensation;Coping strategies training;Therapeutic exercise;Neuromuscular education;Functional Mobility Training;Passive range of motion;Visual/perceptual remediation/compensation;Patient/family education;Paraffin    Plan  HEP for coordination and grip strength Lt hand, show pt paraffin for OA    Clinical Decision Making  Several treatment options, min-mod task modification necessary    Consulted and Agree with Plan of Care  Patient;Family member/caregiver    Family Member Consulted  WIFE       Patient will benefit from skilled therapeutic intervention in order to improve the following deficits and impairments:  Decreased coordination, Decreased range of motion, Decreased endurance, Decreased activity tolerance, Impaired UE functional use, Pain, Decreased strength, Decreased mobility, Decreased knowledge of use of DME, Decreased cognition  Visit Diagnosis: Muscle weakness (generalized) - Plan: Ot plan of care cert/re-cert  Unsteadiness on feet - Plan: Ot plan of care cert/re-cert  Other lack of coordination - Plan: Ot plan of care cert/re-cert    Problem List Patient Active Problem List   Diagnosis Date Noted  . Small vessel disease ([place]) 04/14/2018  . Diastolic dysfunction   . Stage 3 chronic kidney disease ([place])   . Aortic  atherosclerosis ([place]) 04/13/2018  . Cerebral thrombosis with cerebral infarction 04/13/2018  . Cerebrovascular accident (CVA) ([place])   . Dyslipidemia   . Essential hypertension   . Gastroesophageal reflux disease   . Weakness 04/12/2018  . Chronic cough 03/28/2018  . OSTEOARTHRITIS, BACK 09/13/2009  . ARTHRITIS, KNEES, BILATERAL 09/13/2009  . 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 ABNORMALITY OF GAIT 09/13/2009    Carey Bullocks, OTR/L 04/23/2018, 2:00 PM  [facility] [address] [place], [place], [address] Phone: [phone]   Fax:  [phone]  Name: AZAREL BANNER MRN: [id number] Date of Birth: 05-05-1932

## 2018-04-23 NOTE — Therapy (Signed)
[facility] [address] [place], [place], [address] Phone: [phone]   Fax:  [phone]  Physical Therapy Evaluation  Patient Details  Name: Duane Park MRN: [id number] Date of Birth: 22-Apr-1941 Referring Provider: Dr. Alysia Penna   Encounter Date: 04/24/2027  PT End of Session - 04/23/18 1557    Visit Number  1    Number of Visits  17    Date for PT Re-Evaluation  06/22/18    Authorization Type  Medicare and [employer] F covered 100%; VL: follow Medicare guidelines    PT Start Time  3980    PT Stop Time  1612    PT Time Calculation (min)  45 min    Equipment Utilized During Treatment  Gait belt    Activity Tolerance  Patient tolerated treatment well    Behavior During Therapy  WFL for tasks assessed/performed       Past Medical History:  Diagnosis Date  . COPD (chronic obstructive pulmonary disease) ([place])   . Hypercholesteremia   . Hypertension     Past Surgical History:  Procedure Laterality Date  . BACK SURGERY    . JOINT REPLACEMENT     arthroscopic surgery rt side    There were no vitals filed for this visit.   Subjective Assessment - 04/23/18 1450    Subjective  Pt discharged from inpatient rehab 04/22/18 following R central cerebral peduncle infarct with L sided weakness.  "We just need to work on getting strength and stability in my legs."  Pt reports his greatest difficulty is getting around.    Patient is accompained by:  Family member    Pertinent History  HTN, hyperlipidemia, COPD, back surgery, R knee arthroscopic surgery, chronic back and knee pain    Limitations  Walking    Patient Stated Goals  To improve strength and stability in legs -  work around a bad back, bad shoulder and bad knees    Currently in Pain?  Yes    Pain Score  4     Pain Location  Back    Pain Orientation  Posterior;Lower    Pain Descriptors / Indicators  Aching    Pain Type  Chronic pain    Pain  Onset  More than a month ago    Pain Frequency  Constant    Pain Relieving Factors  injections         [facility] PT Assessment - 04/23/18 1453      Assessment   Medical Diagnosis  R CVA, L weakness    Referring Provider  Dr. Alysia Penna    Onset Date/Surgical Date  04/12/18    Hand Dominance  Right    Prior Therapy  CIR d/c 04/22/18      Precautions   Precautions  Fall    Precaution Comments  no driving, HTN, hyperlipidemia, COPD, chronic pain low back and knees, low back surgery      Balance Screen   Has the patient fallen in the past 6 months  No    Has the patient had a decrease in activity level because of a fear of falling?   Yes    Is the patient reluctant to leave their home because of a fear of falling?   No      Home Environment   Living Environment  Private residence    Living Arrangements  Spouse/significant other    Type of [place] Access  Stairs to enter    [employer] of Steps  3    Entrance Stairs-Rails  Right;Left   grab bars   Home Layout  Two level;Able to live on main level with bedroom/bathroom    [place] - 2 wheels      Prior Function   Level of Independence  Independent    Vocation  Retired      Observation/Other Assessments   Focus on Therapeutic Outcomes (FOTO)   not assessed      Sensation   Light Touch  Appears Intact      Coordination   Gross Motor Movements are Fluid and Coordinated  No    Coordination and Movement Description  inconsistent activation in LLE      ROM / Strength   AROM / PROM / Strength  Strength      Strength   Overall Strength  Deficits    Overall Strength Comments  RLE: 5/5, LLE: 3+/5 inconsistent effort      Ambulation/Gait   Ambulation/Gait  Yes    Ambulation/Gait Assistance  4: Min guard    Ambulation Distance (Feet)  300 Feet    Assistive device  None    Gait Pattern  Step-through pattern;Decreased step length - left;Decreased stance time - left;Decreased stride  length;Decreased weight shift to left;Lateral trunk lean to right    Ambulation Surface  Level;Indoor    Gait Comments  performed head turns/nods, changes in gait speed, changes in direction and sudden stops with no LOB or veering      Standardized Balance Assessment   Standardized Balance Assessment  Berg Balance Test;Five Times Sit to Stand;10 meter walk test    Five times sit to stand comments   19.81 from mat, without UE     10 Meter Walk  15.06 seconds with RW or 2.17 ft/sec; without RW 12.16 seconds or 2.69 ft/sec       Berg Balance Test   Sit to Stand  Able to stand  independently using hands    Standing Unsupported  Able to stand safely 2 minutes    Sitting with Back Unsupported but Feet Supported on Floor or Stool  Able to sit safely and securely 2 minutes    Stand to Sit  Controls descent by using hands    Transfers  Able to transfer safely, definite need of hands    Standing Unsupported with Eyes Closed  Able to stand 10 seconds with supervision    Standing Ubsupported with Feet Together  Able to place feet together independently and stand for 1 minute with supervision    From Standing, Reach Forward with Outstretched Arm  Can reach forward >12 cm safely (5")    From Standing Position, Pick up Object from Floor  Able to pick up shoe, needs supervision    From Standing Position, Turn to Look Behind Over each Shoulder  Turn sideways only but maintains balance    Turn 360 Degrees  Able to turn 360 degrees safely but slowly    Standing Unsupported, Alternately Place Feet on Step/Stool  Able to complete 4 steps without aid or supervision    Standing Unsupported, One Foot in Front  Needs help to step but can hold 15 seconds    Standing on One Leg  Able to lift leg independently and hold equal to or more than 3 seconds    Total Score  38    Berg comment:  38/56       Patient demonstrates  increased fall risk as noted by score of  38/56 on Berg Balance Scale.  (<36= high risk for falls,  close to 100%; 37-45 significant >80%; 46-51 moderate >50%; 52-55 lower >25%)           Objective measurements completed on examination: See above findings.              PT Education - 04/23/18 1557    Education Details  Clinical findings, PT POC and goals    Person(s) Educated  Patient;Spouse    Methods  Explanation    Comprehension  Verbalized understanding       PT Short Term Goals - 04/23/18 1610      PT SHORT TERM GOAL #1   Title  Pt will perform initial HEP with supervision of wife    Time  4    Period  Weeks    Status  New    Target Date  05/23/18      PT SHORT TERM GOAL #2   Title  Pt will improve LE strength to perform five time sit to stand without use of UE from mat to </= 15 seconds    Baseline  19 seconds    Time  4    Period  Weeks    Status  New    Target Date  05/23/18      PT SHORT TERM GOAL #3   Title  Pt will demonstrate decreased falls risk with increase in BERG to >/ = 46/56    Baseline  38/56    Time  4    Period  Weeks    Status  New    Target Date  05/23/18      PT SHORT TERM GOAL #4   Title  Pt will improve gait velocity with RW to >/= 2.6 ft/sec to decrease falls risk    Baseline  2.17 ft/sec with RW    Time  4    Period  Weeks    Status  New    Target Date  05/23/18      PT SHORT TERM GOAL #5   Title  Pt will negotiate 3 stairs with rails, alternating sequence with supervision    Baseline  TBA    Time  4    Period  Weeks    Status  New    Target Date  05/23/18        PT Long Term Goals - 04/23/18 1622      PT LONG TERM GOAL #1   Title  Pt will be independent with final HEP    Time  8    Period  Weeks    Status  New    Target Date  06/22/18      PT LONG TERM GOAL #2   Title  Pt will increase LE strength to perform five time sit to stand in </= 13 seconds without UE support    Time  8    Period  Weeks    Status  New    Target Date  06/22/18      PT LONG TERM GOAL #3   Title  Pt will increase BERG to  >/= 52/56 to decrease falls risk to low    Time  8    Period  Weeks    Target Date  06/22/18      PT LONG TERM GOAL #4   Title  Pt will increase gait velocity without AD to >/= 2.8 ft/sec to decrease  risk for falls in community    Baseline  2.69    Time  8    Period  Weeks    Status  New    Target Date  06/22/18      PT LONG TERM GOAL #5   Title  Pt will ambulate x 500' outside over paved surfaces and negotiate 4 stairs, alternating sequence MOD I    Time  8    Period  Weeks    Status  New    Target Date  06/22/18             Plan - 04/23/18 1607    Clinical Impression Statement  Pt is a 84 year old male referred to Neuro OPPT for evaluation of L sided weakness after R CVA.  [name] PMH is significant for the following: back surgery, R knee surgery, chronic LBP and knee pain, HTN, hyperlipidemia, COPD. The following deficits were noted during pt's exam: impaired activity tolerance and endurance, impaired LLE strength, impaired balance and impaired gait.  [name] gait speed, five time sit to stand and BERG score indicates pt is at risk for falls. Pt would benefit from skilled PT to address these impairments and functional limitations to maximize functional mobility independence and reduce falls risk.    History and Personal Factors relevant to plan of care:  independent and driving prior to CVA, back surgery, R knee surgery, chronic LBP and knee pain, HTN, hyperlipidemia, COPD    Clinical Presentation  Evolving    Clinical Presentation due to:  independent and driving prior to CVA, back surgery, R knee surgery, chronic LBP and knee pain, HTN, hyperlipidemia, COPD    Clinical Decision Making  Moderate    Rehab Potential  Good    PT Frequency  2x / week    PT Duration  8 weeks    PT Treatment/Interventions  ADLs/Self Care Home Management;Aquatic Therapy;Electrical Stimulation;DME Instruction;Gait training;Stair training;Functional mobility training;Therapeutic activities;Therapeutic  exercise;Balance training;Neuromuscular re-education;Patient/family education    PT Next Visit Plan  ASSESS VITALS AT [facility] VISIT; ASSESS PAIN IN BACK AND KNEES WITH ACTIVITY.  Assess stairs.  Initiate balance and LLE strengthening HEP.  L NMR.  Gait without RW    Consulted and Agree with Plan of Care  Patient    Family Member Consulted  wife       Patient will benefit from skilled therapeutic intervention in order to improve the following deficits and impairments:  Abnormal gait, Decreased activity tolerance, Decreased balance, Decreased coordination, Decreased endurance, Decreased strength, Difficulty walking, Pain  Visit Diagnosis: Hemiplegia and hemiparesis following cerebral infarction affecting left non-dominant side (HCC)  Muscle weakness (generalized)  Unsteadiness on feet  Difficulty in walking, not elsewhere classified     Problem List Patient Active Problem List   Diagnosis Date Noted  . Small vessel disease ([place]) 04/14/2018  . Diastolic dysfunction   . Stage 3 chronic kidney disease ([place])   . Aortic atherosclerosis ([place]) 04/13/2018  . Cerebral thrombosis with cerebral infarction 04/13/2018  . Cerebrovascular accident (CVA) ([place])   . Dyslipidemia   . Essential hypertension   . Gastroesophageal reflux disease   . Weakness 04/12/2018  . Chronic cough 03/28/2018  . OSTEOARTHRITIS, BACK 09/13/2009  . ARTHRITIS, KNEES, BILATERAL 09/13/2009  . 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 ABNORMALITY OF GAIT 09/13/2009    Rico Junker, PT, DPT 04/23/18    4:28 PM    [facility] [address] [place],  [place], [address] Phone: [phone]   Fax:  [phone]  Name: WAYDE GOPAUL MRN: [id number] Date of Birth: August 11, 1932

## 2018-04-24 ENCOUNTER — Telehealth: Payer: Self-pay | Admitting: *Deleted

## 2018-04-24 DIAGNOSIS — I639 Cerebral infarction, unspecified: Secondary | ICD-10-CM | POA: Diagnosis not present

## 2018-04-24 NOTE — Telephone Encounter (Signed)
Transitional Care call-I spoke with his wife    1. Are you/is patient experiencing any problems since coming home? Are there any questions regarding any aspect of care?NO 2. Are there any questions regarding medications administration/dosing? Are meds being taken as prescribed? Patient should review meds with caller to confirm NO QUESTIONS, HAS ALL OF MEDICATIONS 3. Have there been any falls? NO 4. Has Home Health been to the house and/or have they contacted you? If not, have you tried to contact them? Can we help you contact them? DECLINED HH, GOING TO OUTPT NEURO REHAB 5. Are bowels and bladder emptying properly? Are there any unexpected incontinence issues? If applicable, is patient following bowel/bladder programs? NO 6. Any fevers, problems with breathing, unexpected pain? NO 7. Are there any skin problems or new areas of breakdown?NO 8. Has the patient/family member arranged specialty MD follow up (ie cardiology/neurology/renal/surgical/etc)?  Can we help arrange? NO SAW PCP TODAY AND WILL SEE NEURO 9/12. 9. Does the patient need any other services or support that we can help arrange? NO 10. Are caregivers following through as expected in assisting the patient? YES 11. Has the patient quit smoking, drinking alcohol, or using drugs as recommended?N/A  Appointment Monday 05/05/18 @1 :00, ARRIVE BY 12;40 TO SEE NP EUNICE THOMAS THEN BACK TO MD ADDRESS REVIEWED [address] [phone]

## 2018-04-28 ENCOUNTER — Ambulatory Visit: Payer: Medicare Other | Admitting: Physical Therapy

## 2018-04-28 VITALS — BP 158/97 | HR 67

## 2018-04-28 DIAGNOSIS — M6281 Muscle weakness (generalized): Secondary | ICD-10-CM | POA: Diagnosis not present

## 2018-04-28 DIAGNOSIS — R2681 Unsteadiness on feet: Secondary | ICD-10-CM

## 2018-04-28 DIAGNOSIS — I69354 Hemiplegia and hemiparesis following cerebral infarction affecting left non-dominant side: Secondary | ICD-10-CM

## 2018-04-28 DIAGNOSIS — R278 Other lack of coordination: Secondary | ICD-10-CM | POA: Diagnosis not present

## 2018-04-28 DIAGNOSIS — R262 Difficulty in walking, not elsewhere classified: Secondary | ICD-10-CM

## 2018-04-28 NOTE — Patient Instructions (Signed)
Access Code: [facility]  URL: [email or web address]  Date: 04/28/2018  Prepared by: Misty Stanley   Exercises  Cross Legged Sit to Stand - 12 reps - 2 sets - 1x daily - 5x weekly  Tandem Stance with Support - 10 reps - 4 sets - 1x daily - 5x weekly  Single Leg Stance with Support - 4 sets - 10 second hold - 1x daily - 5x weekly  Standing Hip Abduction with Resistance at Ankles and Unilateral Counter Support - 10 reps - 2 sets - 1x daily - 5x weekly  Standing Hamstring Curl with Resistance - 10 reps - 2 sets - 1x daily - 5x weekly

## 2018-04-28 NOTE — Therapy (Signed)
[facility] [address] [place], [place], [address] Phone: [phone]   Fax:  [phone]  Physical Therapy Treatment  Patient Details  Name: Duane Park MRN: [id number] Date of Birth: [birth year] Referring Provider: Dr. Alysia Penna   Encounter Date: 04/28/2018  PT End of Session - 04/28/18 1302    Visit Number  2    Number of Visits  17    Date for PT Re-Evaluation  06/22/18    Authorization Type  Medicare and [employer] F covered 100%; VL: follow Medicare guidelines    PT Start Time  1104    PT Stop Time  1150    PT Time Calculation (min)  46 min    Equipment Utilized During Treatment  Gait belt    Activity Tolerance  Patient tolerated treatment well    Behavior During Therapy  WFL for tasks assessed/performed       Past Medical History:  Diagnosis Date  . COPD (chronic obstructive pulmonary disease) ([place])   . Hypercholesteremia   . Hypertension     Past Surgical History:  Procedure Laterality Date  . BACK SURGERY    . JOINT REPLACEMENT     arthroscopic surgery rt side    Vitals:   04/28/18 1110  BP: (!) 158/97  Pulse: 67    Subjective Assessment - 04/28/18 1110    Subjective  No issues to report; no questions from eval     Patient is accompained by:  Family member    Pertinent History  HTN, hyperlipidemia, COPD, back surgery, R knee arthroscopic surgery, chronic back and knee pain    Limitations  Walking    Patient Stated Goals  To improve strength and stability in legs -  work around a bad back, bad shoulder and bad knees    Currently in Pain?  Yes    Pain Onset  More than a month ago         [facility] PT Assessment - 04/28/18 1117      Ambulation/Gait   Stairs  Yes    Stairs Assistance  5: Supervision;4: Min guard    Stairs Assistance Details (indicate cue type and reason)  first with step to and then with alternating sequence    Stair Management Technique  Two  rails;Alternating pattern;Step to pattern;Forwards    Number of Stairs  8    Height of Stairs  6    Gait Comments  150/85 after stairs.      Performed the following exercises for HEP with bilat UE support except sit to stand without UE support and SLS pt to perform with unilateral UE support.  For resisted hip strengthening pt to use red theraband.  Wife present to observe.  Access Code: [email or web address]  URL: [email or web address]  Date: 04/28/2018  Prepared by: Misty Stanley   Exercises  Cross Legged Sit to Stand - 12 reps - 2 sets - 1x daily - 5x weekly  Tandem Stance with Support - 10 reps - 4 sets - 1x daily - 5x weekly  Single Leg Stance with Support - 4 sets - 10 second hold - 1x daily - 5x weekly  Standing Hip Abduction with Resistance at Ankles and Unilateral Counter Support - 10 reps - 2 sets - 1x daily - 5x weekly  Standing Hamstring Curl with Resistance - 10 reps - 2 sets - 1x daily - 5x weekly  PT Education - 04/28/18 1302    Education Details  BP readings - need to calibrate home cuff; initiated standing HEP    Person(s) Educated  Patient;Spouse    Methods  Explanation;Demonstration;Handout    Comprehension  Verbalized understanding;Returned demonstration       PT Short Term Goals - 04/28/18 1308      PT SHORT TERM GOAL #1   Title  Pt will perform initial HEP with supervision of wife  (ALL STG DUE BY 05/23/18)    Time  4    Period  Weeks    Status  New      PT SHORT TERM GOAL #2   Title  Pt will improve LE strength to perform five time sit to stand without use of UE from mat to </= 15 seconds    Baseline  19 seconds    Time  4    Period  Weeks    Status  New      PT SHORT TERM GOAL #3   Title  Pt will demonstrate decreased falls risk with increase in BERG to >/ = 46/56    Baseline  38/56    Time  4    Period  Weeks    Status  New      PT SHORT TERM GOAL #4   Title  Pt will improve gait velocity with RW to >/=  2.6 ft/sec to decrease falls risk    Baseline  2.17 ft/sec with RW    Time  4    Period  Weeks    Status  New      PT SHORT TERM GOAL #5   Title  Pt will negotiate 3 stairs with rails, alternating sequence with supervision    Status  Achieved        PT Long Term Goals - 04/23/18 1622      PT LONG TERM GOAL #1   Title  Pt will be independent with final HEP    Time  8    Period  Weeks    Status  New    Target Date  06/22/18      PT LONG TERM GOAL #2   Title  Pt will increase LE strength to perform five time sit to stand in </= 13 seconds without UE support    Time  8    Period  Weeks    Status  New    Target Date  06/22/18      PT LONG TERM GOAL #3   Title  Pt will increase BERG to >/= 52/56 to decrease falls risk to low    Time  8    Period  Weeks    Target Date  06/22/18      PT LONG TERM GOAL #4   Title  Pt will increase gait velocity without AD to >/= 2.8 ft/sec to decrease risk for falls in community    Baseline  2.69    Time  8    Period  Weeks    Status  New    Target Date  06/22/18      PT LONG TERM GOAL #5   Title  Pt will ambulate x 500' outside over paved surfaces and negotiate 4 stairs, alternating sequence MOD I    Time  8    Period  Weeks    Status  New    Target Date  06/22/18            Plan -  04/28/18 1303    Clinical Impression Statement  Performed assessment of stair negotiation with bilat UE support with pt performing with step to and alternating sequence with no evidence of imbalance or LE weakness/instability; no goal for stair negotiation needed.  Initiated standing balance and LE strengthening HEP with use of countertop for UE support and red theraband for LE resistance.  Pt reported mild pain/discomfort in knees with SLS.  Will continue to assess and progress as pt is able to tolerate.      Rehab Potential  Good    PT Frequency  2x / week    PT Duration  8 weeks    PT Treatment/Interventions  ADLs/Self Care Home Management;Aquatic  Therapy;Electrical Stimulation;DME Instruction;Gait training;Stair training;Functional mobility training;Therapeutic activities;Therapeutic exercise;Balance training;Neuromuscular re-education;Patient/family education    PT Next Visit Plan  ASSESS VITALS AT [facility] VISIT; ASSESS PAIN IN BACK AND KNEES WITH ACTIVITY.  Progress HEP - how are his knees tolerating exercises?  Add bridging; progress corner balance.  L NMR.  Gait without RW    PT Home Exercise Plan  [email or web address]     Consulted and Agree with Plan of Care  Patient    Family Member Consulted  wife       Patient will benefit from skilled therapeutic intervention in order to improve the following deficits and impairments:  Abnormal gait, Decreased activity tolerance, Decreased balance, Decreased coordination, Decreased endurance, Decreased strength, Difficulty walking, Pain  Visit Diagnosis: Muscle weakness (generalized)  Unsteadiness on feet  Hemiplegia and hemiparesis following cerebral infarction affecting left non-dominant side (HCC)  Difficulty in walking, not elsewhere classified     Problem List Patient Active Problem List   Diagnosis Date Noted  . Small vessel disease ([place]) 04/14/2018  . Diastolic dysfunction   . Stage 3 chronic kidney disease ([place])   . Aortic atherosclerosis ([place]) 04/13/2018  . Cerebral thrombosis with cerebral infarction 04/13/2018  . Cerebrovascular accident (CVA) ([place])   . Dyslipidemia   . Essential hypertension   . Gastroesophageal reflux disease   . Weakness 04/12/2018  . Chronic cough 03/28/2018  . OSTEOARTHRITIS, BACK 09/13/2009  . ARTHRITIS, KNEES, BILATERAL 09/13/2009  . 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 ABNORMALITY OF GAIT 09/13/2009    Rico Junker, PT, DPT 04/28/18    1:12 PM    [facility] [address] [place], [place], [address] Phone: [phone]   Fax:  [phone]  Name: Duane Park MRN:  [id number] Date of Birth: 04/19/1932

## 2018-04-29 ENCOUNTER — Encounter: Payer: Self-pay | Admitting: Occupational Therapy

## 2018-04-29 ENCOUNTER — Ambulatory Visit: Payer: Medicare Other | Admitting: Occupational Therapy

## 2018-04-29 DIAGNOSIS — R278 Other lack of coordination: Secondary | ICD-10-CM | POA: Diagnosis not present

## 2018-04-29 DIAGNOSIS — R262 Difficulty in walking, not elsewhere classified: Secondary | ICD-10-CM | POA: Diagnosis not present

## 2018-04-29 DIAGNOSIS — I69354 Hemiplegia and hemiparesis following cerebral infarction affecting left non-dominant side: Secondary | ICD-10-CM | POA: Diagnosis not present

## 2018-04-29 DIAGNOSIS — M6281 Muscle weakness (generalized): Secondary | ICD-10-CM | POA: Diagnosis not present

## 2018-04-29 DIAGNOSIS — R2681 Unsteadiness on feet: Secondary | ICD-10-CM

## 2018-04-29 NOTE — Therapy (Signed)
[facility] [address] [place], [place], [address] Phone: [phone]   Fax:  [phone]  Occupational Therapy Treatment  Patient Details  Name: Duane Park MRN: [id number] Date of Birth: 18-May-1932 Referring Provider: Dr. Alysia Penna   Encounter Date: 04/29/2018  OT End of Session - 04/29/18 1637    Visit Number  2    Number of Visits  9    Date for OT Re-Evaluation  05/24/18    Authorization Type  MCR    Authorization - Visit Number  2    Authorization - Number of Visits  10    OT Start Time  7035    OT Stop Time  1619    OT Time Calculation (min)  49 min    Activity Tolerance  Patient tolerated treatment well    Behavior During Therapy  [facility] for tasks assessed/performed       Past Medical History:  Diagnosis Date  . COPD (chronic obstructive pulmonary disease) ([place])   . Hypercholesteremia   . Hypertension     Past Surgical History:  Procedure Laterality Date  . BACK SURGERY    . JOINT REPLACEMENT     arthroscopic surgery rt side    There were no vitals filed for this visit.  Subjective Assessment - 04/29/18 1534    Subjective   Get this left side working.    Patient is accompained by:  Family member    Pertinent History  HTN, HLD, HOH, OA lower back and bilateral knees, Back surgery 2011, Rt shoulder surgery to remove bone spurs 2012    Limitations  no driving    Currently in Pain?  Yes    Pain Score  4     Pain Location  Back    Pain Orientation  Posterior;Lower    Pain Descriptors / Indicators  Aching    Pain Type  Chronic pain    Pain Onset  More than a month ago    Pain Frequency  Constant    Aggravating Factors   standing    Pain Relieving Factors  Tylenol                   OT Treatments/Exercises (OP) - 04/29/18 0001      Theraputty   Theraputty - Flatten  Red putty    Theraputty - Roll  Red    Theraputty - Grip  red    Theraputty - Pinch  red      Fine  Motor Coordination (Hand/Wrist)   Fine Motor Coordination  In hand manipuation training;Manipulation of small objects;Flipping cards;Dealing card with thumb;Picking up coins;Manipulating coins;Stacking coins   to establish a home exercise program            OT Education - 04/29/18 1636    Education Details  reviewed all long term goals, initiated HEP for coordination left UE, and hand strengthening with red putty    Person(s) Educated  Patient;Spouse    Methods  Explanation;Demonstration    Comprehension  Verbalized understanding;Returned demonstration          OT Long Term Goals - 04/29/18 1642      OT LONG TERM GOAL #1   Title  Independent with HEP for coordination, strength LUE - 05/24/18    Status  On-going      OT LONG TERM GOAL #2   Title  Pt to perform dynamic standing tasks for 10 min. w/o LOB     Status  On-going      OT LONG TERM GOAL #3   Title  Pt to perform simple snack prep at mod I level using DME prn    Status  On-going      OT LONG TERM GOAL #4   Title  Improve Lt grip strength to 52 lbs or greater    Status  On-going      OT LONG TERM GOAL #5   Title  Pt to improve coordination Lt hand as evidenced by performing 9 hole peg test in 40 sec. or under            Plan - 04/29/18 1638    Clinical Impression Statement  Patient is incorporating his left upper extremity into functional tasks routinely.  Patient eager for improved standing balance    Occupational Profile and client history currently impacting functional performance  PMH: HTN, HLD, HOH, OA back/knees/hands, DDD lower back, premorbid Rt shoulder deficits, back surgery 2011, Rt shoulder sx 2012    Occupational performance deficits (Please refer to evaluation for details):  IADL's;Social Participation    Rehab Potential  Good    OT Frequency  2x / week    OT Duration  4 weeks    OT Treatment/Interventions  Self-care/ADL training;Moist Heat;DME and/or AE instruction;Therapeutic  activities;Cognitive remediation/compensation;Coping strategies training;Therapeutic exercise;Neuromuscular education;Functional Mobility Training;Passive range of motion;Visual/perceptual remediation/compensation;Patient/family education;Paraffin    Plan  paraffin for OA(?), dynamic stand balance, functional kitchen task    Clinical Decision Making  Several treatment options, min-mod task modification necessary    OT Home Exercise Plan  initiated HEP Coordination and red putty for LUE    Consulted and Agree with Plan of Care  Patient;Family member/caregiver    Family Member Consulted  WIFE       Patient will benefit from skilled therapeutic intervention in order to improve the following deficits and impairments:  Decreased coordination, Decreased range of motion, Decreased endurance, Decreased activity tolerance, Impaired UE functional use, Pain, Decreased strength, Decreased mobility, Decreased knowledge of use of DME, Decreased cognition  Visit Diagnosis: Hemiplegia and hemiparesis following cerebral infarction affecting left non-dominant side (HCC)  Other lack of coordination  Unsteadiness on feet  Muscle weakness (generalized)    Problem List Patient Active Problem List   Diagnosis Date Noted  . Small vessel disease ([place]) 04/14/2018  . Diastolic dysfunction   . Stage 3 chronic kidney disease ([place])   . Aortic atherosclerosis ([place]) 04/13/2018  . Cerebral thrombosis with cerebral infarction 04/13/2018  . Cerebrovascular accident (CVA) ([place])   . Dyslipidemia   . Essential hypertension   . Gastroesophageal reflux disease   . Weakness 04/12/2018  . Chronic cough 03/28/2018  . OSTEOARTHRITIS, BACK 09/13/2009  . ARTHRITIS, KNEES, BILATERAL 09/13/2009  . 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 ABNORMALITY OF GAIT 09/13/2009    [name], OTR/L 04/29/2018, 4:44 PM  [facility] [address] [place], [place], [address] Phone: [phone]   Fax:  [phone]  Name: Duane Park MRN: [id number] Date of Birth: [birth year]

## 2018-04-29 NOTE — Patient Instructions (Addendum)
  Coordination Activities  Perform the following activities for 5-10 minutes 1-2 times per day with left hand(s).   Rotate ball in fingertips (clockwise and counter-clockwise).  Toss ball between hands.  Toss ball in air and catch with the same hand.  Flip cards 1 at a time as fast as you can.  Deal cards with your thumb (Hold deck in hand and push card off top with thumb).  Rotate card in hand (clockwise and counter-clockwise).  Shuffle cards.  Pick up coins, buttons, marbles, dried beans/pasta of different sizes and place in container.  Pick up coins and place in container or coin bank.  Pick up coins and stack.  Pick up coins one at a time until you get 5-10 in your hand, then move coins from palm to fingertips to stack one at a time.   11. Grip Strengthening (Resistive Putty)   Squeeze putty using thumb and all fingers. Repeat 15 times. Do 1-2 sessions per day.   Extension (Assistive Putty)   Roll putty back and forth, being sure to use all fingertips. Repeat 3 times. Do 1-2 sessions per day.  Then pinch as below.   Palmar Pinch Strengthening (Resistive Putty)   Pinch putty between thumb and each fingertip in turn after rolling out      With thumb and all fingers in center of putty donut, stretch out. Repeat 5-10 times. Do 1-2 sessions per day.        Keeping knuckles straight, bend fingertips to squeeze putty. Repeat 10 times. Do 1-2 sessions per day.    Bending only at large knuckles, press putty down against thumb. Keep fingertips straight. Repeat 10 times. Do 1-2 sessions per day.     Squeeze between thumb and side of each finger in turn. Repeat 10 times. Do 1-2 sessions per day.   FINGERS: Extension (Putty)    Open hand and fingers to flatten putty.

## 2018-05-01 ENCOUNTER — Ambulatory Visit: Payer: Medicare Other | Admitting: Occupational Therapy

## 2018-05-02 ENCOUNTER — Encounter: Payer: Self-pay | Admitting: Physical Therapy

## 2018-05-02 ENCOUNTER — Ambulatory Visit: Payer: Medicare Other | Admitting: Physical Therapy

## 2018-05-02 ENCOUNTER — Encounter: Payer: Self-pay | Admitting: Occupational Therapy

## 2018-05-02 ENCOUNTER — Ambulatory Visit: Payer: Medicare Other | Admitting: Occupational Therapy

## 2018-05-02 ENCOUNTER — Encounter: Payer: PRIVATE HEALTH INSURANCE | Admitting: Occupational Therapy

## 2018-05-02 VITALS — BP 139/81 | HR 76

## 2018-05-02 VITALS — BP 140/78

## 2018-05-02 DIAGNOSIS — R262 Difficulty in walking, not elsewhere classified: Secondary | ICD-10-CM | POA: Diagnosis not present

## 2018-05-02 DIAGNOSIS — I69354 Hemiplegia and hemiparesis following cerebral infarction affecting left non-dominant side: Secondary | ICD-10-CM | POA: Diagnosis not present

## 2018-05-02 DIAGNOSIS — R2681 Unsteadiness on feet: Secondary | ICD-10-CM

## 2018-05-02 DIAGNOSIS — M6281 Muscle weakness (generalized): Secondary | ICD-10-CM

## 2018-05-02 DIAGNOSIS — R278 Other lack of coordination: Secondary | ICD-10-CM | POA: Diagnosis not present

## 2018-05-02 NOTE — Patient Instructions (Signed)
Access Code: [facility]  URL: [email or web address]  Date: 05/02/2018  Prepared by: Willow Ora   Exercises  Cross Legged Sit to Stand - 12 reps - 2 sets - 1x daily - 5x weekly  Tandem Stance with Support - 10 reps - 4 sets - 1x daily - 5x weekly  Single Leg Stance with Support - 4 sets - 10 second hold - 1x daily - 5x weekly  Standing Hip Abduction with Resistance at Ankles and Unilateral Counter Support - 10 reps - 2 sets - 1x daily - 5x weekly  Standing Hamstring Curl with Resistance - 10 reps - 2 sets - 1x daily - 5x weekly  Single Leg Bridge - 10 reps - 1 sets - 5 hold - 1x daily - 5x weekly  Romberg Stance Eyes Closed on Foam Pad - 3 reps - 1 sets - 30 hold - 1x daily - 5x weekly  Narrow Stance with Eyes Closed and Head Rotation on Foam Pad - 10 reps - 1 sets - 1x daily - 5x weekly  Narrow Stance with Eyes Closed and Head Nods on Foam Pad - 10 reps - 1 sets - 1x daily - 5x weekly

## 2018-05-02 NOTE — Therapy (Signed)
[facility] [address] [place], [place], [address] Phone: [phone]   Fax:  [phone]  Occupational Therapy Treatment  Patient Details  Name: Duane Park MRN: [id number] Date of Birth: 1931/11/17 Referring Provider: Dr. Alysia Penna   Encounter Date: 05/02/2018  OT End of Session - 05/02/18 1201    Visit Number  3    Number of Visits  9    Date for OT Re-Evaluation  05/24/18    Authorization Type  MCR    Authorization - Visit Number  3    Authorization - Number of Visits  10    OT Start Time  1100    OT Stop Time  1143    OT Time Calculation (min)  43 min       Past Medical History:  Diagnosis Date  . COPD (chronic obstructive pulmonary disease) ([place])   . Hypercholesteremia   . Hypertension     Past Surgical History:  Procedure Laterality Date  . BACK SURGERY    . JOINT REPLACEMENT     arthroscopic surgery rt side    Vitals:   05/02/18 1153  BP: 139/81  Pulse: 76    Subjective Assessment - 05/02/18 1153    Subjective   Patient reports he is walking in hallway withut walker at home in preparation for weaning off walker    Patient is accompained by:  Family member    Pertinent History  HTN, HLD, HOH, OA lower back and bilateral knees, Back surgery 2011, Rt shoulder surgery to remove bone spurs 2012    Limitations  no driving    Currently in Pain?  Yes    Pain Score  4     Pain Location  Knee    Pain Orientation  Right;Left    Pain Descriptors / Indicators  Aching    Pain Type  Chronic pain    Pain Onset  More than a month ago    Pain Frequency  Intermittent    Aggravating Factors   activity    Pain Relieving Factors  rest, tylenol                   OT Treatments/Exercises (OP) - 05/02/18 0001      Neurological Re-education Exercises   Other Exercises 1  Dynamic stand balance.  Patient has adequate hip and ankle strategies to adjust to mild perturbations in any  direction, and random input.  Worked on gross motor coordiantion to address bilateral UE coord, as well as whole body - tossing and catching - self - to partner.  Challenged more dynamic mobiity - walking and tossing ball to self.  Patient with mild difficulty anticipating release with step length.  Patient now able to reach to floor to obtain dropped lightweight item.               OT Education - 05/02/18 1200    Education Details  discussed decreasing use of UE's in support or for balance during functional standing tasks; encouraged wife to leave bathroom for showering    Person(s) Educated  Patient;Spouse    Methods  Explanation;Demonstration    Comprehension  Verbalized understanding;Returned demonstration          OT Long Term Goals - 04/29/18 1642      OT LONG TERM GOAL #1   Title  Independent with HEP for coordination, strength LUE - 05/24/18    Status  On-going      OT LONG  TERM GOAL #2   Title  Pt to perform dynamic standing tasks for 10 min. w/o LOB     Status  On-going      OT LONG TERM GOAL #3   Title  Pt to perform simple snack prep at mod I level using DME prn    Status  On-going      OT LONG TERM GOAL #4   Title  Improve Lt grip strength to 52 lbs or greater    Status  On-going      OT LONG TERM GOAL #5   Title  Pt to improve coordination Lt hand as evidenced by performing 9 hole peg test in 40 sec. or under            Plan - 05/02/18 1201    Clinical Impression Statement  Patient is showing improved functional mobility, and improved functional use of left UE.      Occupational Profile and client history currently impacting functional performance  PMH: HTN, HLD, HOH, OA back/knees/hands, DDD lower back, premorbid Rt shoulder deficits, back surgery 2011, Rt shoulder sx 2012    Occupational performance deficits (Please refer to evaluation for details):  IADL's;Social Participation    Rehab Potential  Good    OT Frequency  2x / week    OT Duration  4  weeks    OT Treatment/Interventions  Self-care/ADL training;Moist Heat;DME and/or AE instruction;Therapeutic activities;Cognitive remediation/compensation;Coping strategies training;Therapeutic exercise;Neuromuscular education;Functional Mobility Training;Passive range of motion;Visual/perceptual remediation/compensation;Patient/family education;Paraffin    Plan  DYNAMIC STAND BALANCE, GROSS MOTOR COORD, CHECK STG    Clinical Decision Making  Several treatment options, min-mod task modification necessary    OT Home Exercise Plan  initiated HEP Coordination and red putty for LUE    Consulted and Agree with Plan of Care  Patient;Family member/caregiver    Family Member Consulted  WIFE       Patient will benefit from skilled therapeutic intervention in order to improve the following deficits and impairments:  Decreased coordination, Decreased range of motion, Decreased endurance, Decreased activity tolerance, Impaired UE functional use, Pain, Decreased strength, Decreased mobility, Decreased knowledge of use of DME, Decreased cognition  Visit Diagnosis: Hemiplegia and hemiparesis following cerebral infarction affecting left non-dominant side (HCC)  Other lack of coordination  Unsteadiness on feet  Muscle weakness (generalized)    Problem List Patient Active Problem List   Diagnosis Date Noted  . Small vessel disease ([place]) 04/14/2018  . Diastolic dysfunction   . Stage 3 chronic kidney disease ([place])   . Aortic atherosclerosis ([place]) 04/13/2018  . Cerebral thrombosis with cerebral infarction 04/13/2018  . Cerebrovascular accident (CVA) ([place])   . Dyslipidemia   . Essential hypertension   . Gastroesophageal reflux disease   . Weakness 04/12/2018  . Chronic cough 03/28/2018  . OSTEOARTHRITIS, BACK 09/13/2009  . ARTHRITIS, KNEES, BILATERAL 09/13/2009  . 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 ABNORMALITY OF GAIT 09/13/2009    [name], OTR/L 05/02/2018, 12:03  PM  [facility] [address] [place], [place], [address] Phone: [phone]   Fax:  [phone]  Name: Duane Park MRN: [id number] Date of Birth: 1932-06-08

## 2018-05-04 NOTE — Therapy (Signed)
[facility] [address] [place], [place], [address] Phone: [phone]   Fax:  [phone]  Physical Therapy Treatment  Patient Details  Name: Duane Park MRN: [id number] Date of Birth: 02-Jan-1932 Referring Provider: Dr. Alysia Penna   Encounter Date: 05/02/2018   05/02/18 1021  PT Visits / Re-Eval  Visit Number 3  Number of Visits 17  Date for PT Re-Evaluation 06/22/18  Authorization  Authorization Type Medicare and [employer] F covered 100%; VL: follow Medicare guidelines  PT Time Calculation  PT Start Time 1018  PT Stop Time 1100  PT Time Calculation (min) 42 min  PT - End of Session  Equipment Utilized During Treatment Gait belt  Activity Tolerance Patient tolerated treatment well  Behavior During Therapy WFL for tasks assessed/performed     Past Medical History:  Diagnosis Date  . COPD (chronic obstructive pulmonary disease) ([place])   . Hypercholesteremia   . Hypertension     Past Surgical History:  Procedure Laterality Date  . BACK SURGERY    . JOINT REPLACEMENT     arthroscopic surgery rt side    Vitals:   05/02/18 1019  BP: 140/78     05/02/18 1019  Symptoms/Limitations  Subjective No new complaints. States current HEP is going well. Brought in home wrist BP cuff.   Patient is accompained by: Family member  Pertinent History HTN, hyperlipidemia, COPD, back surgery, R knee arthroscopic surgery, chronic back and knee pain  Limitations Walking  Patient Stated Goals To improve strength and stability in legs -  work around a bad back, bad shoulder and bad knees  Pain Assessment  Currently in Pain? Yes  Pain Score 4  Pain Location Generalized (back, knees, right shoulder)  Pain Descriptors / Indicators Aching  Pain Type Chronic pain  Pain Onset More than a month ago  Pain Frequency Constant  Aggravating Factors  certain movements, bending knees, standing a lot  Pain Relieving  Factors Tylenol, rest, ice at times      05/02/18 1047  Self-Care  Self-Care Other Self-Care Comments  Other Self-Care Comments  pt brought in his home wrist BP cuff to be checked against manual BP taken here. used his 1st, then did manual one that was 20 points lower on systolic and diastolic. pt/spouse felt the 1st reading was inaccurate and did a 2cd reading with his that was much closer (about 5 points off) from manual readings.              Neuro Re-ed   Neuro Re-ed Details  issued corner balance ex's to address balance. min guard assist for balance with cues on posture and weight shifting to assist with balance recovery.   Exercises  Other Exercises  reviewed with [place] program ex's issued at last session. Pt reported he could not do the sit<>stands with feet crossed. Read directions to him that feet are to be staggered and pt performed 10 reps in session. no issued with remaining ex's with verbal review only. added single leg bridge to HEP today with cues on technique. minimal hip lift to prevent onset of back pain with cues for abdominal bracing needed.   Knee/Hip Exercises: Aerobic  Other Aerobic Scifit with bil LE's/left UE at level 2.5 for 8 mintues with goal >/= 60 rpm for strengthening and activity tolerance.     Access Code: [facility]  URL: [email or web address]  Date: 05/02/2018  Prepared by: Willow Ora   Exercises  Cross Legged Sit to  Stand - 12 reps - 2 sets - 1x daily - 5x weekly  Tandem Stance with Support - 10 reps - 4 sets - 1x daily - 5x weekly  Single Leg Stance with Support - 4 sets - 10 second hold - 1x daily - 5x weekly  Standing Hip Abduction with Resistance at Ankles and Unilateral Counter Support - 10 reps - 2 sets - 1x daily - 5x weekly  Standing Hamstring Curl with Resistance - 10 reps - 2 sets - 1x daily - 5x weekly  Single Leg Bridge - 10 reps - 1 sets - 5 hold - 1x daily - 5x weekly  Romberg Stance Eyes Closed on Foam Pad - 3 reps - 1 sets  - 30 hold - 1x daily - 5x weekly  Narrow Stance with Eyes Closed and Head Rotation on Foam Pad - 10 reps - 1 sets - 1x daily - 5x weekly  Narrow Stance with Eyes Closed and Head Nods on Foam Pad - 10 reps - 1 sets - 1x daily - 5x weekly      05/02/18 1057  PT Education  Education Details additions to [employer]) Educated Patient;Spouse  Methods Explanation;Demonstration;Verbal cues;Handout  Comprehension Verbalized understanding;Returned demonstration;Verbal cues required        PT Short Term Goals - 04/28/18 1308      PT SHORT TERM GOAL #1   Title  Pt will perform initial HEP with supervision of wife  (ALL STG DUE BY 05/23/18)    Time  4    Period  Weeks    Status  New      PT SHORT TERM GOAL #2   Title  Pt will improve LE strength to perform five time sit to stand without use of UE from mat to </= 15 seconds    Baseline  19 seconds    Time  4    Period  Weeks    Status  New      PT SHORT TERM GOAL #3   Title  Pt will demonstrate decreased falls risk with increase in BERG to >/ = 46/56    Baseline  38/56    Time  4    Period  Weeks    Status  New      PT SHORT TERM GOAL #4   Title  Pt will improve gait velocity with RW to >/= 2.6 ft/sec to decrease falls risk    Baseline  2.17 ft/sec with RW    Time  4    Period  Weeks    Status  New      PT SHORT TERM GOAL #5   Title  Pt will negotiate 3 stairs with rails, alternating sequence with supervision    Status  Achieved        PT Long Term Goals - 04/23/18 1622      PT LONG TERM GOAL #1   Title  Pt will be independent with final HEP    Time  8    Period  Weeks    Status  New    Target Date  06/22/18      PT LONG TERM GOAL #2   Title  Pt will increase LE strength to perform five time sit to stand in </= 13 seconds without UE support    Time  8    Period  Weeks    Status  New    Target Date  06/22/18      PT LONG TERM  GOAL #3   Title  Pt will increase BERG to >/= 52/56 to decrease falls risk to low     Time  8    Period  Weeks    Target Date  06/22/18      PT LONG TERM GOAL #4   Title  Pt will increase gait velocity without AD to >/= 2.8 ft/sec to decrease risk for falls in community    Baseline  2.69    Time  8    Period  Weeks    Status  New    Target Date  06/22/18      PT LONG TERM GOAL #5   Title  Pt will ambulate x 500' outside over paved surfaces and negotiate 4 stairs, alternating sequence MOD I    Time  8    Period  Weeks    Status  New    Target Date  06/22/18         05/02/18 1021  Plan  Clinical Impression Statement Todays skilled session compared his home BP machine to manual readings in clinic. Initially they were about 20 points off, however on recheck with home machine they were very close in reading. Pt and spouse feel he did not have it positioned correctly with 1st one. Remainder of session focused on additions to HEP with no issues noted and strengthening/activity tolerance with use of Scifit. Pt is progressing toward goals and should benefit from continued PT to progress toward unmet goals.   Pt will benefit from skilled therapeutic intervention in order to improve on the following deficits Abnormal gait;Decreased activity tolerance;Decreased balance;Decreased coordination;Decreased endurance;Decreased strength;Difficulty walking;Pain  Rehab Potential Good  PT Frequency 2x / week  PT Duration 8 weeks  PT Treatment/Interventions ADLs/Self Care Home Management;Aquatic Therapy;Electrical Stimulation;DME Instruction;Gait training;Stair training;Functional mobility training;Therapeutic activities;Therapeutic exercise;Balance training;Neuromuscular re-education;Patient/family education  PT Next Visit Plan ASSESS VITALS AT [facility] VISIT; ASSESS PAIN IN BACK AND KNEES WITH ACTIVITY.L NMR.  Gait without RW. Continue with LE strengthening.   PT [place]   Consulted and Agree with Plan of Care Patient  Family Member Consulted wife          Patient  will benefit from skilled therapeutic intervention in order to improve the following deficits and impairments:  Abnormal gait, Decreased activity tolerance, Decreased balance, Decreased coordination, Decreased endurance, Decreased strength, Difficulty walking, Pain  Visit Diagnosis: Hemiplegia and hemiparesis following cerebral infarction affecting left non-dominant side (HCC)  Unsteadiness on feet  Difficulty in walking, not elsewhere classified  Muscle weakness (generalized)     Problem List Patient Active Problem List   Diagnosis Date Noted  . Small vessel disease ([place]) 04/14/2018  . Diastolic dysfunction   . Stage 3 chronic kidney disease ([place])   . Aortic atherosclerosis ([place]) 04/13/2018  . Cerebral thrombosis with cerebral infarction 04/13/2018  . Cerebrovascular accident (CVA) ([place])   . Dyslipidemia   . Essential hypertension   . Gastroesophageal reflux disease   . Weakness 04/12/2018  . Chronic cough 03/28/2018  . OSTEOARTHRITIS, BACK 09/13/2009  . ARTHRITIS, KNEES, BILATERAL 09/13/2009  . 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 ABNORMALITY OF GAIT 09/13/2009    Willow Ora, PTA, [facility] [address], [place], [place] [address] [phone] 05/04/18, 12:57 PM   Name: Duane Park MRN: [id number] Date of Birth: 1932/02/09

## 2018-05-05 ENCOUNTER — Encounter: Payer: Medicare Other | Attending: Registered Nurse | Admitting: Registered Nurse

## 2018-05-05 ENCOUNTER — Ambulatory Visit: Payer: Medicare Other | Admitting: Physical Therapy

## 2018-05-05 ENCOUNTER — Encounter: Payer: Self-pay | Admitting: Registered Nurse

## 2018-05-05 VITALS — BP 143/87 | HR 72 | Resp 14 | Ht 71.0 in | Wt 206.0 lb

## 2018-05-05 VITALS — BP 145/69

## 2018-05-05 DIAGNOSIS — E785 Hyperlipidemia, unspecified: Secondary | ICD-10-CM

## 2018-05-05 DIAGNOSIS — R262 Difficulty in walking, not elsewhere classified: Secondary | ICD-10-CM

## 2018-05-05 DIAGNOSIS — R278 Other lack of coordination: Secondary | ICD-10-CM | POA: Diagnosis not present

## 2018-05-05 DIAGNOSIS — M545 Low back pain: Secondary | ICD-10-CM | POA: Insufficient documentation

## 2018-05-05 DIAGNOSIS — I6930 Unspecified sequelae of cerebral infarction: Secondary | ICD-10-CM | POA: Diagnosis not present

## 2018-05-05 DIAGNOSIS — Z836 Family history of other diseases of the respiratory system: Secondary | ICD-10-CM | POA: Insufficient documentation

## 2018-05-05 DIAGNOSIS — Z8249 Family history of ischemic heart disease and other diseases of the circulatory system: Secondary | ICD-10-CM | POA: Insufficient documentation

## 2018-05-05 DIAGNOSIS — R2681 Unsteadiness on feet: Secondary | ICD-10-CM | POA: Diagnosis not present

## 2018-05-05 DIAGNOSIS — J449 Chronic obstructive pulmonary disease, unspecified: Secondary | ICD-10-CM | POA: Diagnosis not present

## 2018-05-05 DIAGNOSIS — M6281 Muscle weakness (generalized): Secondary | ICD-10-CM

## 2018-05-05 DIAGNOSIS — E7800 Pure hypercholesterolemia, unspecified: Secondary | ICD-10-CM | POA: Diagnosis not present

## 2018-05-05 DIAGNOSIS — I69354 Hemiplegia and hemiparesis following cerebral infarction affecting left non-dominant side: Secondary | ICD-10-CM | POA: Diagnosis not present

## 2018-05-05 DIAGNOSIS — M25562 Pain in left knee: Secondary | ICD-10-CM | POA: Insufficient documentation

## 2018-05-05 DIAGNOSIS — I6330 Cerebral infarction due to thrombosis of unspecified cerebral artery: Secondary | ICD-10-CM

## 2018-05-05 DIAGNOSIS — I10 Essential (primary) hypertension: Secondary | ICD-10-CM | POA: Diagnosis not present

## 2018-05-05 NOTE — Progress Notes (Signed)
Subjective:    Patient ID: Duane Park, male    DOB: Oct 28, 1931, 84 y.o.   MRN: [id number]  HPI: [name] is a 84 year old male who is here for transitional care visit in follow up of his cerebral thrombosis with cerebral infarction, HTN and dyslipidemia. He has been home receiuving therapy at [facility]. He states he has pain in his lower back and left knee. He rated his pain 0. His current exercise regime is walking and attending therapy at [facility].   Pain Inventory Average Pain 4 Pain Right Now 0 My pain is intermittent, sharp, stabbing and aching  In the last 24 hours, has pain interfered with the following? General activity 0 Relation with others 0 Enjoyment of life 0 What TIME of day is your pain at its worst? varies with activity Sleep (in general) Fair  Pain is worse with: walking, standing and some activites Pain improves with: heat/ice and medication Relief from Meds: 5  Mobility walk with assistance use a walker ability to climb steps?  yes do you drive?  no Do you have any goals in this area?  yes  Function retired  Neuro/Psych trouble walking  Prior Studies Any changes since last visit?  no  Physicians involved in your care Any changes since last visit?  no   Family History  Problem Relation Age of Onset  . Lung disease Mother        never smoker  . Heart disease Father    Social History   Socioeconomic History  . Marital status: Married    Spouse name: Not on file  . Number of children: Not on file  . Years of education: Not on file  . Highest education level: Not on file  Occupational History  . Not on file  Social Needs  . Financial resource strain: Not on file  . Food insecurity:    Worry: Not on file    Inability: Not on file  . Transportation needs:    Medical: Not on file    Non-medical: Not on file  Tobacco Use  . Smoking status: Never Smoker  . Smokeless tobacco: Never Used  Substance and  Sexual Activity  . Alcohol use: Not Currently    Frequency: Never  . Drug use: Never  . Sexual activity: Not on file  Lifestyle  . Physical activity:    Days per week: Not on file    Minutes per session: Not on file  . Stress: Not on file  Relationships  . Social connections:    Talks on phone: Not on file    Gets together: Not on file    Attends religious service: Not on file    Active member of club or organization: Not on file    Attends meetings of clubs or organizations: Not on file    Relationship status: Not on file  Other Topics Concern  . Not on file  Social History Narrative  . Not on file   Past Surgical History:  Procedure Laterality Date  . BACK SURGERY    . JOINT REPLACEMENT     arthroscopic surgery rt side   Past Medical History:  Diagnosis Date  . COPD (chronic obstructive pulmonary disease) ([place])   . Hypercholesteremia   . Hypertension    BP (!) 143/87 (BP Location: Left Arm, Patient Position: Sitting, Cuff Size: Normal)   Pulse 72   Resp 14   Ht 5\' 11"  (1.803 m)  Wt 206 lb (93.4 kg)   SpO2 96%   BMI 28.73 kg/m   Opioid Risk Score:   Fall Risk Score:  `1  Depression screen PHQ 2/9  Depression screen PHQ 2/9 05/05/2018  Decreased Interest 0  Down, Depressed, Hopeless 0  PHQ - 2 Score 0  Altered sleeping 0  Tired, decreased energy 3  Change in appetite 0  Feeling bad or failure about yourself  0  Trouble concentrating 0  Moving slowly or fidgety/restless 0  Suicidal thoughts 0  PHQ-9 Score 3  Difficult doing work/chores Not difficult at all    Review of Systems  Constitutional: Negative.   HENT: Negative.   Eyes: Negative.   Respiratory: Negative.   Cardiovascular: Negative.   Gastrointestinal: Negative.   Endocrine: Negative.   Genitourinary: Negative.   Skin: Negative.   Allergic/Immunologic: Negative.   Hematological: Negative.   Psychiatric/Behavioral: Negative.        Objective:   Physical Exam  Constitutional: He  is oriented to person, place, and time. He appears well-developed and well-nourished.  HENT:  Head: Normocephalic and atraumatic.  Neck: Normal range of motion. Neck supple.  Cardiovascular: Normal rate and regular rhythm.  Pulmonary/Chest: Effort normal and breath sounds normal.  Musculoskeletal:  Normal Muscle Bulk and Muscle Testing Reveals: Upper Extremities: Right: Decreased ROM 90 Degrees and Muscle Strength 5/5 and Left: Full ROM and Muscle Strength 5/5 Back without spinal tenderness noted Lower Extremities Full ROM and Muscle Strength 5/5 Arises from Table with ease  Narrow Based gait  Neurological: He is alert and oriented to person, place, and time.  Skin: Skin is warm and dry.  Psychiatric: He has a normal mood and affect. His behavior is normal.  Nursing note and vitals reviewed.         Assessment & Plan:  1. Cerebral Thrombosis with Cerebral Infarction: Continue Home Health Therapies: Has a scheduled appointment to F/U with Neurology.  2. HTN: Continue to monitor. PCP Following. Dyslipidemia: Continue current medication regimen. Continue to monitor. PCP Following.   30 minutes of face to face patient care time was spent during this visit. All questions were encouraged and answered.

## 2018-05-05 NOTE — Therapy (Signed)
[facility] [address] [place], [place], [address] Phone: [phone]   Fax:  [phone]  Physical Therapy Treatment  Patient Details  Name: Duane Park MRN: [id number] Date of Birth: 1932/08/06 Referring Provider: Dr. Alysia Penna   Encounter Date: 05/05/2018  PT End of Session - 05/05/18 1102    Visit Number  4    Number of Visits  17    Date for PT Re-Evaluation  06/22/18    Authorization Type  Medicare and [employer] F covered 100%; VL: follow Medicare guidelines    PT Start Time  1017    PT Stop Time  1100    PT Time Calculation (min)  43 min    Equipment Utilized During Treatment  Gait belt    Activity Tolerance  Patient tolerated treatment well    Behavior During Therapy  WFL for tasks assessed/performed       Past Medical History:  Diagnosis Date  . COPD (chronic obstructive pulmonary disease) ([place])   . Hypercholesteremia   . Hypertension     Past Surgical History:  Procedure Laterality Date  . BACK SURGERY    . JOINT REPLACEMENT     arthroscopic surgery rt side    Vitals:   05/05/18 1019  BP: (!) 145/69    Subjective Assessment - 05/05/18 1020    Subjective  Pt reports that the new exercise given last week ( unilateral bridging) " did not work for his back"; thinks it may have triggered back pain.    Patient is accompained by:  Family member    Pertinent History  HTN, hyperlipidemia, COPD, back surgery, R knee arthroscopic surgery, chronic back and knee pain    Limitations  Walking    Patient Stated Goals  To improve strength and stability in legs -  work around a bad back, bad shoulder and bad knees    Currently in Pain?  Yes    Pain Score  4     Pain Location  Knee    Pain Orientation  Right;Left    Pain Descriptors / Indicators  Aching    Pain Type  Chronic pain    Pain Onset  More than a month ago    Pain Frequency  Intermittent    Multiple Pain Sites  Yes    Pain  Score  3    Pain Location  Back    Pain Orientation  Mid    Pain Descriptors / Indicators  Tightness    Pain Type  Chronic pain    Pain Onset  More than a month ago    Pain Frequency  Constant    Aggravating Factors   some exercises.                       [place] Adult PT Treatment/Exercise - 05/05/18 0001      Ambulation/Gait   Ambulation/Gait  Yes    Ambulation/Gait Assistance  5: Supervision    Ambulation/Gait Assistance Details  training for activity tolerance and balance with LRAD    Ambulation Distance (Feet)  230 Feet   (with walker) + 230 with SPC with quad tip   Assistive device  Rolling walker;Straight cane   SPC with quad tip.   Gait Pattern  Step-through pattern;Decreased step length - left;Decreased stance time - left;Decreased stride length;Decreased weight shift to left;Lateral trunk lean to right    Ambulation Surface  Level;Indoor      Knee/Hip  Exercises: Aerobic   Other Aerobic  Scifit with bil LE's/left UE at started at level 1.0 (due to pt reporting knee pain from last time ), progressed to Level 2.0, 5 mintues            Balance Exercises - 05/05/18 1043      Balance Exercises: Standing   Standing Eyes Closed  Narrow base of support (BOS);Head turns   + head nods; intermittent UE support; Reports 6/10 pain in back; resolved with seated rest break.       PT Education - 05/05/18 1059    Education Details  Discussed rationale for asking questions about pain level and what pt should expect with POC and HEP ( that some discomfort is normal but should resolve within 24-48 hrs and should not hinder pt from daily activities).       PT Short Term Goals - 04/28/18 1308      PT SHORT TERM GOAL #1   Title  Pt will perform initial HEP with supervision of wife  (ALL STG DUE BY 05/23/18)    Time  4    Period  Weeks    Status  New      PT SHORT TERM GOAL #2   Title  Pt will improve LE strength to perform five time sit to stand without use of UE  from mat to </= 15 seconds    Baseline  19 seconds    Time  4    Period  Weeks    Status  New      PT SHORT TERM GOAL #3   Title  Pt will demonstrate decreased falls risk with increase in BERG to >/ = 46/56    Baseline  38/56    Time  4    Period  Weeks    Status  New      PT SHORT TERM GOAL #4   Title  Pt will improve gait velocity with RW to >/= 2.6 ft/sec to decrease falls risk    Baseline  2.17 ft/sec with RW    Time  4    Period  Weeks    Status  New      PT SHORT TERM GOAL #5   Title  Pt will negotiate 3 stairs with rails, alternating sequence with supervision    Status  Achieved        PT Long Term Goals - 04/23/18 1622      PT LONG TERM GOAL #1   Title  Pt will be independent with final HEP    Time  8    Period  Weeks    Status  New    Target Date  06/22/18      PT LONG TERM GOAL #2   Title  Pt will increase LE strength to perform five time sit to stand in </= 13 seconds without UE support    Time  8    Period  Weeks    Status  New    Target Date  06/22/18      PT LONG TERM GOAL #3   Title  Pt will increase BERG to >/= 52/56 to decrease falls risk to low    Time  8    Period  Weeks    Target Date  06/22/18      PT LONG TERM GOAL #4   Title  Pt will increase gait velocity without AD to >/= 2.8 ft/sec to decrease risk for falls in community  Baseline  2.69    Time  8    Period  Weeks    Status  New    Target Date  06/22/18      PT LONG TERM GOAL #5   Title  Pt will ambulate x 500' outside over paved surfaces and negotiate 4 stairs, alternating sequence MOD I    Time  8    Period  Weeks    Status  New    Target Date  06/22/18            Plan - 05/05/18 1306    Clinical Impression Statement  This PTA put bridging with 1 LE exercise on hold due to pt reporting that he thinks it may have caused his back to hurt more since last visit.  Pt reported a 2 increment increase in back pain with standing balance exercises on compliant surface but pt  reported that pain resolved after sitting rest break.  Pt requires intermittent UE support for standing with narrow BOS on compliant surface with eyes closed.  Performed gait training with SPC with quad tip for activity tolerance; pt performed at supervision level with no LOB.                                                                      Rehab Potential  Good    PT Frequency  2x / week    PT Duration  8 weeks    PT Treatment/Interventions  ADLs/Self Care Home Management;Aquatic Therapy;Electrical Stimulation;DME Instruction;Gait training;Stair training;Functional mobility training;Therapeutic activities;Therapeutic exercise;Balance training;Neuromuscular re-education;Patient/family education    PT Next Visit Plan  ASSESS VITALS AT [facility] VISIT; ASSESS PAIN IN BACK AND KNEES WITH ACTIVITY.  Initiate Walking program with walker with a goal to transition to LRAD.    PT [place]     Consulted and Agree with Plan of Care  Patient    Family Member Consulted  wife       Patient will benefit from skilled therapeutic intervention in order to improve the following deficits and impairments:  Abnormal gait, Decreased activity tolerance, Decreased balance, Decreased coordination, Decreased endurance, Decreased strength, Difficulty walking, Pain  Visit Diagnosis: Hemiplegia and hemiparesis following cerebral infarction affecting left non-dominant side (HCC)  Other lack of coordination  Difficulty in walking, not elsewhere classified  Muscle weakness (generalized)  Unsteadiness on feet     Problem List Patient Active Problem List   Diagnosis Date Noted  . Small vessel disease ([place]) 04/14/2018  . Diastolic dysfunction   . Stage 3 chronic kidney disease ([place])   . Aortic atherosclerosis ([place]) 04/13/2018  . Cerebral thrombosis with cerebral infarction 04/13/2018  . Cerebrovascular accident (CVA) ([place])   . Dyslipidemia   . Essential hypertension   . Gastroesophageal reflux  disease   . Weakness 04/12/2018  . Chronic cough 03/28/2018  . OSTEOARTHRITIS, BACK 09/13/2009  . ARTHRITIS, KNEES, BILATERAL 09/13/2009  . 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 ABNORMALITY OF GAIT 09/13/2009    Bjorn Loser, PTA  05/05/18, 1:18 PM [facility] [address] [place], [place], [address] Phone: [phone]   Fax:  [phone]  Name: Duane Park MRN: [id number] Date of Birth: March 14, 1932

## 2018-05-06 ENCOUNTER — Ambulatory Visit: Payer: PRIVATE HEALTH INSURANCE | Admitting: Internal Medicine

## 2018-05-07 ENCOUNTER — Ambulatory Visit: Payer: Medicare Other | Admitting: Occupational Therapy

## 2018-05-07 ENCOUNTER — Ambulatory Visit: Payer: Medicare Other | Admitting: Physical Therapy

## 2018-05-07 ENCOUNTER — Encounter: Payer: Self-pay | Admitting: Occupational Therapy

## 2018-05-07 ENCOUNTER — Encounter: Payer: Self-pay | Admitting: Physical Therapy

## 2018-05-07 VITALS — BP 141/74

## 2018-05-07 DIAGNOSIS — R2681 Unsteadiness on feet: Secondary | ICD-10-CM

## 2018-05-07 DIAGNOSIS — I69354 Hemiplegia and hemiparesis following cerebral infarction affecting left non-dominant side: Secondary | ICD-10-CM

## 2018-05-07 DIAGNOSIS — M6281 Muscle weakness (generalized): Secondary | ICD-10-CM | POA: Diagnosis not present

## 2018-05-07 DIAGNOSIS — R278 Other lack of coordination: Secondary | ICD-10-CM | POA: Diagnosis not present

## 2018-05-07 DIAGNOSIS — R262 Difficulty in walking, not elsewhere classified: Secondary | ICD-10-CM

## 2018-05-07 NOTE — Therapy (Signed)
[facility] [address] [place], [place], [address] Phone: [phone]   Fax:  [phone]  Occupational Therapy Treatment  Patient Details  Name: Duane Park MRN: [id number] Date of Birth: [birth year] Referring Provider: Dr. Alysia Penna   Encounter Date: 05/07/2018  OT End of Session - 05/07/18 1130    Visit Number  4    Number of Visits  9    Date for OT Re-Evaluation  05/24/18    Authorization Type  MCR    Authorization - Visit Number  4    Authorization - Number of Visits  10    OT Start Time  8921    OT Stop Time  1057    OT Time Calculation (min)  42 min    Activity Tolerance  Patient tolerated treatment well    Behavior During Therapy  [facility] for tasks assessed/performed       Past Medical History:  Diagnosis Date  . COPD (chronic obstructive pulmonary disease) ([place])   . Hypercholesteremia   . Hypertension     Past Surgical History:  Procedure Laterality Date  . BACK SURGERY    . JOINT REPLACEMENT     arthroscopic surgery rt side    There were no vitals filed for this visit.  Subjective Assessment - 05/07/18 1121    Subjective   Per patient's wife- "I lethim take a shower by himself!"    Patient is accompained by:  Family member    Pertinent History  HTN, HLD, HOH, OA lower back and bilateral knees, Back surgery 2011, Rt shoulder surgery to remove bone spurs 2012    Limitations  no driving    Currently in Pain?  Yes    Pain Score  4     Pain Location  Back    Pain Orientation  Mid;Lower    Pain Descriptors / Indicators  Aching    Pain Type  Chronic pain    Pain Onset  In the past 7 days    Pain Frequency  Constant    Aggravating Factors   irritated by activity in therapy earlier this week.      Pain Relieving Factors  rest, tylenol         [facility] OT Assessment - 05/07/18 0001      Coordination   Left 9 Hole Peg Test  36.54 sec      Hand Function   Left Hand Grip (lbs)  54                OT Treatments/Exercises (OP) - 05/07/18 0001      ADLs   Cooking  Patient able to walk throughout kitchen without assistive device.  Able to reach to overhead cabinet, to low cabinet without difficulty.  Able to manage all doors, drawers, etc.  Patient able to scramble and cook eggs on stovetop without cueing.      Driving  Had overt discussion with patient and wife regarding retunr to driving.  They have neurology appointment in two weeks.  Do not anticipate any difficulty returning to driving.  Patient does have diplopia in lower visual fields - but this has been a longstanding issue and he has compensated with head tilt for years.  Discussed the importance of speaking with neurology at next visit to see if he could be cleared for driving - then trying graduated driving program.  Explained that if neurology raised concerns - behind the wheel driving assessment would be an option.  Reviewed potential cost and benefit of behind the wheel drive assessment - but DO NOT anticipate that patient will need this to retunr to driving.               OT Education - 05/07/18 1130    Education Details  graduated drive program    Person(s) Educated  Patient;Spouse    Methods  Explanation    Comprehension  Verbalized understanding          OT Long Term Goals - 05/07/18 1224      OT LONG TERM GOAL #1   Title  Independent with HEP for coordination, strength LUE - 05/24/18    Status  Achieved      OT LONG TERM GOAL #2   Title  Pt to perform dynamic standing tasks for 10 min. w/o LOB     Status  On-going      OT LONG TERM GOAL #3   Title  Pt to perform simple snack prep at mod I level using DME prn    Status  Achieved      OT LONG TERM GOAL #4   Title  Improve Lt grip strength to 52 lbs or greater    Status  Achieved   54     OT LONG TERM GOAL #5   Title  Pt to improve coordination Lt hand as evidenced by performing 9 hole peg test in 40 sec. or under    Status   Achieved   36+           Plan - 05/07/18 1223    Clinical Impression Statement  Anticipate earlier discharge due to patient rapidly approaching OT goals.  Patient and wife aware.      Occupational Profile and client history currently impacting functional performance  PMH: HTN, HLD, HOH, OA back/knees/hands, DDD lower back, premorbid Rt shoulder deficits, back surgery 2011, Rt shoulder sx 2012    Occupational performance deficits (Please refer to evaluation for details):  IADL's;Social Participation    Rehab Potential  Good    OT Frequency  2x / week    OT Duration  4 weeks    OT Treatment/Interventions  Self-care/ADL training;Moist Heat;DME and/or AE instruction;Therapeutic activities;Cognitive remediation/compensation;Coping strategies training;Therapeutic exercise;Neuromuscular education;Functional Mobility Training;Passive range of motion;Visual/perceptual remediation/compensation;Patient/family education;[place]    Plan  check remaining LTG's, possibke discharge    Clinical Decision Making  Several treatment options, min-mod task modification necessary    OT Home Exercise Plan  initiated HEP Coordination and red putty for LUE    Consulted and Agree with Plan of Care  Patient;Family member/caregiver    Family Member Consulted  WIFE       Patient will benefit from skilled therapeutic intervention in order to improve the following deficits and impairments:  Decreased coordination, Decreased range of motion, Decreased endurance, Decreased activity tolerance, Impaired UE functional use, Pain, Decreased strength, Decreased mobility, Decreased knowledge of use of DME, Decreased cognition  Visit Diagnosis: Hemiplegia and hemiparesis following cerebral infarction affecting left non-dominant side (HCC)  Muscle weakness (generalized)  Unsteadiness on feet  Other lack of coordination    Problem List Patient Active Problem List   Diagnosis Date Noted  . Small vessel disease ([place])  04/14/2018  . Diastolic dysfunction   . Stage 3 chronic kidney disease ([place])   . Aortic atherosclerosis ([place]) 04/13/2018  . Cerebral thrombosis with cerebral infarction 04/13/2018  . Cerebrovascular accident (CVA) ([place])   . Dyslipidemia   . Essential hypertension   . Gastroesophageal  reflux disease   . Weakness 04/12/2018  . Chronic cough 03/28/2018  . OSTEOARTHRITIS, BACK 09/13/2009  . ARTHRITIS, KNEES, BILATERAL 09/13/2009  . 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 ABNORMALITY OF GAIT 09/13/2009    [name], OTR/L 05/07/2018, 12:25 PM  [facility] [address] [place], [place], [address] Phone: [phone]   Fax:  [phone]  Name: Duane Park MRN: [id number] Date of Birth: 1932-04-27

## 2018-05-07 NOTE — Therapy (Signed)
[facility] [address] [place], [place], [address] Phone: [phone]   Fax:  [phone]  Physical Therapy Treatment  Patient Details  Name: Duane Park MRN: [id number] Date of Birth: Apr 08, 1932 Referring Provider: Dr. Alysia Penna   Encounter Date: 05/07/2018  PT End of Session - 05/07/18 1017    Visit Number  5    Number of Visits  17    Date for PT Re-Evaluation  06/22/18    Authorization Type  Medicare and [employer] F covered 100%; VL: follow Medicare guidelines    PT Start Time  0935    PT Stop Time  1013    PT Time Calculation (min)  38 min    Equipment Utilized During Treatment  Gait belt    Activity Tolerance  Patient tolerated treatment well    Behavior During Therapy  WFL for tasks assessed/performed       Past Medical History:  Diagnosis Date  . COPD (chronic obstructive pulmonary disease) ([place])   . Hypercholesteremia   . Hypertension     Past Surgical History:  Procedure Laterality Date  . BACK SURGERY    . JOINT REPLACEMENT     arthroscopic surgery rt side    Vitals:   05/07/18 0939  BP: (!) 141/74    Subjective Assessment - 05/07/18 0935    Subjective  Reports that back and LE bothered him after last session. Pt felt, "the most miserable since I've been coming."  Per pt, he does his HEP every day that he's not in therapy ( was able to do them yesterday."    Patient is accompained by:  Family member    Pertinent History  HTN, hyperlipidemia, COPD, back surgery, R knee arthroscopic surgery, chronic back and knee pain    Limitations  Walking    Patient Stated Goals  To improve strength and stability in legs -  work around a bad back, bad shoulder and bad knees    Currently in Pain?  Yes    Pain Score  4     Pain Location  Knee    Pain Orientation  Right;Left   L knee pain greater than R.   Pain Type  Chronic pain    Pain Onset  More than a month ago    Pain Frequency   Constant    Pain Score  6    Pain Location  Back    Pain Orientation  Mid    Pain Descriptors / Indicators  Aching    Pain Onset  More than a month ago    Pain Frequency  Constant                       [facility] Adult PT Treatment/Exercise - 05/07/18 0001      Ambulation/Gait   Ambulation/Gait  Yes    Ambulation/Gait Assistance  5: Supervision    Ambulation/Gait Assistance Details  activity tolerance and balance with SPC: visual scanning activity    Ambulation Distance (Feet)  230 Feet   x3 with seated rest breaks   Assistive device  Straight cane   with quad tip   Gait Pattern  Step-through pattern;Decreased step length - left;Decreased stance time - left;Decreased stride length;Decreased weight shift to left;Lateral trunk lean to right    Ambulation Surface  Level;Indoor    Stairs  Yes    Stairs Assistance  5: Supervision    Stair Management Technique  Two rails;Alternating pattern  Number of Stairs  12      Exercises   Exercises  Knee/Hip      Knee/Hip Exercises: Standing   Knee Flexion  Strengthening;Right;Left;1 set;10 reps   with red band   Hip Abduction  Stengthening;Right;Left;1 set;10 reps      Knee/Hip Exercises: Seated   Long Arc Quad  AROM;Strengthening;1 set;10 reps    Ball Squeeze  x10    Other Seated Knee/Hip Exercises  Ankle pumps with LEs extended  10x2    Marching  AROM;Strengthening;1 set;10 reps          Balance Exercises - 05/07/18 1139      Balance Exercises: Standing   Standing Eyes Closed  Other reps (comment)   declined; pt thinks that it will aggravate back pain   Tandem Stance  Other reps (comment)   declined due to reporting that it causes back pain.   SLS  Upper extremity support 1;Eyes open;4 reps;10 secs          PT Short Term Goals - 04/28/18 1308      PT SHORT TERM GOAL #1   Title  Pt will perform initial HEP with supervision of wife  (ALL STG DUE BY 05/23/18)    Time  4    Period  Weeks    Status  New       PT SHORT TERM GOAL #2   Title  Pt will improve LE strength to perform five time sit to stand without use of UE from mat to </= 15 seconds    Baseline  19 seconds    Time  4    Period  Weeks    Status  New      PT SHORT TERM GOAL #3   Title  Pt will demonstrate decreased falls risk with increase in BERG to >/ = 46/56    Baseline  38/56    Time  4    Period  Weeks    Status  New      PT SHORT TERM GOAL #4   Title  Pt will improve gait velocity with RW to >/= 2.6 ft/sec to decrease falls risk    Baseline  2.17 ft/sec with RW    Time  4    Period  Weeks    Status  New      PT SHORT TERM GOAL #5   Title  Pt will negotiate 3 stairs with rails, alternating sequence with supervision    Status  Achieved        PT Long Term Goals - 04/23/18 1622      PT LONG TERM GOAL #1   Title  Pt will be independent with final HEP    Time  8    Period  Weeks    Status  New    Target Date  06/22/18      PT LONG TERM GOAL #2   Title  Pt will increase LE strength to perform five time sit to stand in </= 13 seconds without UE support    Time  8    Period  Weeks    Status  New    Target Date  06/22/18      PT LONG TERM GOAL #3   Title  Pt will increase BERG to >/= 52/56 to decrease falls risk to low    Time  8    Period  Weeks    Target Date  06/22/18      PT LONG TERM  GOAL #4   Title  Pt will increase gait velocity without AD to >/= 2.8 ft/sec to decrease risk for falls in community    Baseline  2.69    Time  8    Period  Weeks    Status  New    Target Date  06/22/18      PT LONG TERM GOAL #5   Title  Pt will ambulate x 500' outside over paved surfaces and negotiate 4 stairs, alternating sequence MOD I    Time  8    Period  Weeks    Status  New    Target Date  06/22/18            Plan - 05/07/18 1255    Clinical Impression Statement  Pt reports that he performs all HEP exercises but not tandem stance due to thinking that it aggravates back pain.  Pt would prefer not to  perform balance exercises with feet together or in tandem stance due to thinking it will aggravate back pain;  evaluating PT made aware.   Skilled PT session focused on balance training during gait with SPC (quad tip) and LE strengthening in seated and standing positions; pt demonstrated being challenged with LE strength exercises.                                                                                               Rehab Potential  Good    PT Frequency  2x / week    PT Duration  8 weeks    PT Treatment/Interventions  ADLs/Self Care Home Management;Aquatic Therapy;Electrical Stimulation;DME Instruction;Gait training;Stair training;Functional mobility training;Therapeutic activities;Therapeutic exercise;Balance training;Neuromuscular re-education;Patient/family education    PT Next Visit Plan  ASSESS VITALS AT [facility] VISIT; ASSESS PAIN IN BACK AND KNEES WITH ACTIVITY.  Initiate Walking program with walker with a goal to transition to LRAD.    PT [place]     Consulted and Agree with Plan of Care  Patient    Family Member Consulted  wife       Patient will benefit from skilled therapeutic intervention in order to improve the following deficits and impairments:  Abnormal gait, Decreased activity tolerance, Decreased balance, Decreased coordination, Decreased endurance, Decreased strength, Difficulty walking, Pain  Visit Diagnosis: Hemiplegia and hemiparesis following cerebral infarction affecting left non-dominant side (HCC)  Difficulty in walking, not elsewhere classified  Muscle weakness (generalized)  Unsteadiness on feet     Problem List Patient Active Problem List   Diagnosis Date Noted  . Small vessel disease ([place]) 04/14/2018  . Diastolic dysfunction   . Stage 3 chronic kidney disease ([place])   . Aortic atherosclerosis ([place]) 04/13/2018  . Cerebral thrombosis with cerebral infarction 04/13/2018  . Cerebrovascular accident (CVA) ([place])   . Dyslipidemia   .  Essential hypertension   . Gastroesophageal reflux disease   . Weakness 04/12/2018  . Chronic cough 03/28/2018  . OSTEOARTHRITIS, BACK 09/13/2009  . ARTHRITIS, KNEES, BILATERAL 09/13/2009  . 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 ABNORMALITY OF GAIT 09/13/2009    Bjorn Loser, PTA  05/07/18, 1:06 PM.  [facility] [address] [place], [place], [address] Phone: [phone]   Fax:  [phone]  Name: EION TIMBROOK MRN: [id number] Date of Birth: 1932-09-08

## 2018-05-14 ENCOUNTER — Encounter: Payer: Self-pay | Admitting: Occupational Therapy

## 2018-05-14 ENCOUNTER — Ambulatory Visit: Payer: Medicare Other | Attending: Physical Medicine & Rehabilitation | Admitting: Occupational Therapy

## 2018-05-14 ENCOUNTER — Encounter: Payer: Self-pay | Admitting: Physical Therapy

## 2018-05-14 ENCOUNTER — Ambulatory Visit: Payer: Medicare Other | Admitting: Physical Therapy

## 2018-05-14 VITALS — BP 159/71

## 2018-05-14 DIAGNOSIS — R2681 Unsteadiness on feet: Secondary | ICD-10-CM | POA: Insufficient documentation

## 2018-05-14 DIAGNOSIS — I69354 Hemiplegia and hemiparesis following cerebral infarction affecting left non-dominant side: Secondary | ICD-10-CM | POA: Insufficient documentation

## 2018-05-14 DIAGNOSIS — R278 Other lack of coordination: Secondary | ICD-10-CM | POA: Diagnosis not present

## 2018-05-14 DIAGNOSIS — R262 Difficulty in walking, not elsewhere classified: Secondary | ICD-10-CM | POA: Insufficient documentation

## 2018-05-14 DIAGNOSIS — M6281 Muscle weakness (generalized): Secondary | ICD-10-CM | POA: Insufficient documentation

## 2018-05-14 NOTE — Therapy (Signed)
[facility] [address] [place], [place], [address] Phone: [phone]   Fax:  [phone]  Physical Therapy Treatment  Patient Details  Name: Duane Park MRN: [id number] Date of Birth: 1932/05/14 Referring Provider: Dr. Alysia Penna   Encounter Date: 05/14/2018  PT End of Session - 05/14/18 1100    Visit Number  6    Number of Visits  17    Date for PT Re-Evaluation  06/22/18    Authorization Type  Medicare and [employer] F covered 100%; VL: follow Medicare guidelines    PT Start Time  1016    PT Stop Time  1056    PT Time Calculation (min)  40 min    Behavior During Therapy  WFL for tasks assessed/performed       Past Medical History:  Diagnosis Date  . COPD (chronic obstructive pulmonary disease) ([place])   . Hypercholesteremia   . Hypertension     Past Surgical History:  Procedure Laterality Date  . BACK SURGERY    . JOINT REPLACEMENT     arthroscopic surgery rt side    Vitals:   05/14/18 1019  BP: (!) 159/71    Subjective Assessment - 05/14/18 1019    Subjective  Pt has had no falls.  Uses walker initially in the morning and then uses cane mostly in the house.  Uses walker in the community.    Patient is accompained by:  Family member    Pertinent History  HTN, hyperlipidemia, COPD, back surgery, R knee arthroscopic surgery, chronic back and knee pain    Limitations  Walking    Patient Stated Goals  To improve strength and stability in legs -  work around a bad back, bad shoulder and bad knees    Currently in Pain?  Yes    Pain Score  3     Pain Location  Back    Pain Descriptors / Indicators  Aching    Pain Onset  In the past 7 days    Multiple Pain Sites  Yes    Pain Score  2    Pain Location  Knee    Pain Orientation  Right;Left    Pain Descriptors / Indicators  Aching    Pain Onset  More than a month ago    Pain Frequency  Constant                        [facility] Adult PT Treatment/Exercise - 05/14/18 0001      Ambulation/Gait   Ambulation/Gait  Yes    Ambulation/Gait Assistance  5: Supervision    Ambulation/Gait Assistance Details  Balance with single point cane and activity tolerance.    Ambulation Distance (Feet)  300 Feet   indoor + 250 ft outdoors   Assistive device  Straight cane    Gait Pattern  Step-through pattern;Decreased step length - left;Decreased stance time - left;Decreased stride length;Decreased weight shift to left;Lateral trunk lean to right    Ambulation Surface  Level;Indoor;Unlevel;Outdoor;Paved;Gravel    Stairs  Yes    Stairs Assistance  5: Supervision    Stair Management Technique  One rail Right;Forwards;With cane;Alternating pattern    Number of Stairs  8    Ramp  5: Supervision    Ramp Details (indicate cue type and reason)  with SPC, no LOB      Knee/Hip Exercises: Aerobic   Other Aerobic  Scifit with bil LE's/left UE, at  level 1.0 (due to pt reporting knee pain from last time ) 8 mintues, >70 rpm for strengthening and activity tolerance.       Knee/Hip Exercises: Seated   Long Arc Quad  AROM;Strengthening;1 set;10 reps    Other Seated Knee/Hip Exercises  Ankle pumps with LEs extended  10x2          Balance Exercises - 05/14/18 1310      Balance Exercises: Standing   Standing Eyes Opened  Wide (BOA);Foam/compliant surface   standing on mulch with one foot forward, head turns/nods min         PT Short Term Goals - 04/28/18 1308      PT SHORT TERM GOAL #1   Title  Pt will perform initial HEP with supervision of wife  (ALL STG DUE BY 05/23/18)    Time  4    Period  Weeks    Status  New      PT SHORT TERM GOAL #2   Title  Pt will improve LE strength to perform five time sit to stand without use of UE from mat to </= 15 seconds    Baseline  19 seconds    Time  4    Period  Weeks    Status  New      PT SHORT TERM GOAL #3   Title  Pt will demonstrate  decreased falls risk with increase in BERG to >/ = 46/56    Baseline  38/56    Time  4    Period  Weeks    Status  New      PT SHORT TERM GOAL #4   Title  Pt will improve gait velocity with RW to >/= 2.6 ft/sec to decrease falls risk    Baseline  2.17 ft/sec with RW    Time  4    Period  Weeks    Status  New      PT SHORT TERM GOAL #5   Title  Pt will negotiate 3 stairs with rails, alternating sequence with supervision    Status  Achieved        PT Long Term Goals - 04/23/18 1622      PT LONG TERM GOAL #1   Title  Pt will be independent with final HEP    Time  8    Period  Weeks    Status  New    Target Date  06/22/18      PT LONG TERM GOAL #2   Title  Pt will increase LE strength to perform five time sit to stand in </= 13 seconds without UE support    Time  8    Period  Weeks    Status  New    Target Date  06/22/18      PT LONG TERM GOAL #3   Title  Pt will increase BERG to >/= 52/56 to decrease falls risk to low    Time  8    Period  Weeks    Target Date  06/22/18      PT LONG TERM GOAL #4   Title  Pt will increase gait velocity without AD to >/= 2.8 ft/sec to decrease risk for falls in community    Baseline  2.69    Time  8    Period  Weeks    Status  New    Target Date  06/22/18      PT LONG TERM GOAL #5   Title  Pt will ambulate x 500' outside over paved surfaces and negotiate 4 stairs, alternating sequence MOD I    Time  8    Period  Weeks    Status  New    Target Date  06/22/18            Plan - 05/14/18 1035    Clinical Impression Statement  Activity tolerance training : Scifit, stayed at L= 1.0 but progressed time to 8 min. Progressed gait distance with SPC, 300 ft; pt reported no increase in pain with back or knees.   Gait training: worked on balance with ambulating with SPC over community barriers and with visual scanning tasks at supervision level.  Performed standing balance with cane in stagger stance with head turns on compliant surface  at supervision level.                                               Rehab Potential  Good    PT Frequency  2x / week    PT Duration  8 weeks    PT Treatment/Interventions  ADLs/Self Care Home Management;Aquatic Therapy;Electrical Stimulation;DME Instruction;Gait training;Stair training;Functional mobility training;Therapeutic activities;Therapeutic exercise;Balance training;Neuromuscular re-education;Patient/family education    PT Next Visit Plan  ASSESS VITALS AT [facility] VISIT; ASSESS PAIN IN BACK AND KNEES WITH ACTIVITY.  Initiate Walking program with walker/cane with a goal to transition to LRAD.  balance with cane and without.    PT [place]     Consulted and Agree with Plan of Care  Patient    Family Member Consulted  wife       Patient will benefit from skilled therapeutic intervention in order to improve the following deficits and impairments:  Abnormal gait, Decreased activity tolerance, Decreased balance, Decreased coordination, Decreased endurance, Decreased strength, Difficulty walking, Pain  Visit Diagnosis: Hemiplegia and hemiparesis following cerebral infarction affecting left non-dominant side (HCC)  Unsteadiness on feet  Difficulty in walking, not elsewhere classified  Muscle weakness (generalized)     Problem List Patient Active Problem List   Diagnosis Date Noted  . Small vessel disease ([place]) 04/14/2018  . Diastolic dysfunction   . Stage 3 chronic kidney disease ([place])   . Aortic atherosclerosis ([place]) 04/13/2018  . Cerebral thrombosis with cerebral infarction 04/13/2018  . Cerebrovascular accident (CVA) ([place])   . Dyslipidemia   . Essential hypertension   . Gastroesophageal reflux disease   . Weakness 04/12/2018  . Chronic cough 03/28/2018  . OSTEOARTHRITIS, BACK 09/13/2009  . ARTHRITIS, KNEES, BILATERAL 09/13/2009  . 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 ABNORMALITY OF GAIT 09/13/2009   Bjorn Loser, PTA  05/14/18, 1:13  [facility] [address] [place], [place], [address] Phone: [phone]   Fax:  [phone]  Name: JUWAUN INSKEEP MRN: [id number] Date of Birth: Nov 27, 1931

## 2018-05-14 NOTE — Therapy (Signed)
[facility] [address] [place], [place], [address] Phone: [phone]   Fax:  [phone]  Occupational Therapy Treatment  Patient Details  Name: Duane Park MRN: [id number] Date of Birth: 03-16-1932 Referring Provider: Dr. Alysia Penna   Encounter Date: 05/14/2018  OT End of Session - 05/14/18 1201    Visit Number  5    Number of Visits  9    Date for OT Re-Evaluation  05/24/18    Authorization Type  MCR    Authorization - Visit Number  5    Authorization - Number of Visits  10    OT Start Time  1101    OT Stop Time  1125    OT Time Calculation (min)  24 min    Activity Tolerance  Patient tolerated treatment well    Behavior During Therapy  [place] for tasks assessed/performed       Past Medical History:  Diagnosis Date  . COPD (chronic obstructive pulmonary disease) ([place])   . Hypercholesteremia   . Hypertension     Past Surgical History:  Procedure Laterality Date  . BACK SURGERY    . JOINT REPLACEMENT     arthroscopic surgery rt side    There were no vitals filed for this visit.  Subjective Assessment - 05/14/18 1102    Subjective   Patient indicates that he is transitioning to cane from walker.      Patient is accompained by:  Family member    Pertinent History  HTN, HLD, HOH, OA lower back and bilateral knees, Back surgery 2011, Rt shoulder surgery to remove bone spurs 2012    Limitations  no driving    Currently in Pain?  No/denies    Pain Score  0-No pain                   OT Treatments/Exercises (OP) - 05/14/18 0001      ADLs   Functional Mobility  Patient is transitioning from walker to cane in the hopes of being able to walk without a device as he did prior tothe stroke.  Patient able to walk and carry grocery bag full of items, up 3 stairs, as in his garage, step over step pattern each way.  Patient able to unload groceries reaching overhead and to floor to open doors and  drawers.  Patient able to move along counter in kitchen without use of cane.      Cooking  Patient effectively griled chicken this weekend for a family barbecue.               OT Education - 05/14/18 1201    Education Details  reviewed goals and progress, reviewed plan to reduce supervision, patient wife agreeable to discharge    Person(s) Educated  Patient;Spouse    Methods  Explanation    Comprehension  Verbalized understanding          OT Long Term Goals - 05/14/18 1203      OT LONG TERM GOAL #1   Title  Independent with HEP for coordination, strength LUE - 05/24/18    Status  Achieved      OT LONG TERM GOAL #2   Title  Pt to perform dynamic standing tasks for 10 min. w/o LOB     Status  Achieved      OT LONG TERM GOAL #3   Title  Pt to perform simple snack prep at mod I level using DME prn  Status  Achieved      OT LONG TERM GOAL #4   Title  Improve Lt grip strength to 52 lbs or greater    Status  Achieved            Plan - 05/14/18 1202    Clinical Impression Statement  Patient has met and in many cases, exceeded his OT goals, and is agreeable to OT discahrge.      Occupational Profile and client history currently impacting functional performance  PMH: HTN, HLD, HOH, OA back/knees/hands, DDD lower back, premorbid Rt shoulder deficits, back surgery 2011, Rt shoulder sx 2012    Occupational performance deficits (Please refer to evaluation for details):  IADL's;Social Participation    Rehab Potential  Good    OT Frequency  2x / week    OT Duration  4 weeks    OT Treatment/Interventions  Self-care/ADL training;Moist Heat;DME and/or AE instruction;Therapeutic activities;Cognitive remediation/compensation;Coping strategies training;Therapeutic exercise;Neuromuscular education;Functional Mobility Training;Passive range of motion;Visual/perceptual remediation/compensation;Patient/family education;[place]    Plan  discharge    Clinical Decision Making  Several  treatment options, min-mod task modification necessary    OT Home Exercise Plan  initiated HEP Coordination and red putty for LUE    Consulted and Agree with Plan of Care  Patient;Family member/caregiver    Family Member Consulted  WIFE       Patient will benefit from skilled therapeutic intervention in order to improve the following deficits and impairments:  Decreased coordination, Decreased range of motion, Decreased endurance, Decreased activity tolerance, Impaired UE functional use, Pain, Decreased strength, Decreased mobility, Decreased knowledge of use of DME, Decreased cognition  Visit Diagnosis: Hemiplegia and hemiparesis following cerebral infarction affecting left non-dominant side (HCC)  Muscle weakness (generalized)  Unsteadiness on feet  Other lack of coordination    Problem List Patient Active Problem List   Diagnosis Date Noted  . Small vessel disease ([place]) 04/14/2018  . Diastolic dysfunction   . Stage 3 chronic kidney disease ([place])   . Aortic atherosclerosis ([place]) 04/13/2018  . Cerebral thrombosis with cerebral infarction 04/13/2018  . Cerebrovascular accident (CVA) ([place])   . Dyslipidemia   . Essential hypertension   . Gastroesophageal reflux disease   . Weakness 04/12/2018  . Chronic cough 03/28/2018  . OSTEOARTHRITIS, BACK 09/13/2009  . ARTHRITIS, KNEES, BILATERAL 09/13/2009  . 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 ABNORMALITY OF GAIT 09/13/2009   OCCUPATIONAL THERAPY DISCHARGE SUMMARY  Visits from Start of Care: 5  Current functional level related to goals / functional outcomes: Patient is able to complete ADL's / IADL's with modified independence   Remaining deficits: Arthritic back and knees, left LE weakness   Education / Equipment: Plan for graduated drive program, plan to reduce 24 hour supervision, HEP for LUE strengthening Plan: Patient agrees to discharge.  Patient goals were met. Patient is being discharged due to meeting the  stated rehab goals.  ?????        [name], OTR/L 05/14/2018, 12:04 PM  [facility] [address] [place], [place], [address] Phone: [phone]   Fax:  [phone]  Name: EMMA SCHUPP MRN: [id number] Date of Birth: [birth year]

## 2018-05-20 ENCOUNTER — Ambulatory Visit: Payer: Medicare Other | Admitting: Physical Therapy

## 2018-05-20 ENCOUNTER — Encounter: Payer: Self-pay | Admitting: Physical Therapy

## 2018-05-20 VITALS — BP 155/81 | HR 66

## 2018-05-20 DIAGNOSIS — M6281 Muscle weakness (generalized): Secondary | ICD-10-CM | POA: Diagnosis not present

## 2018-05-20 DIAGNOSIS — I69354 Hemiplegia and hemiparesis following cerebral infarction affecting left non-dominant side: Secondary | ICD-10-CM | POA: Diagnosis not present

## 2018-05-20 DIAGNOSIS — R278 Other lack of coordination: Secondary | ICD-10-CM | POA: Diagnosis not present

## 2018-05-20 DIAGNOSIS — R2681 Unsteadiness on feet: Secondary | ICD-10-CM | POA: Diagnosis not present

## 2018-05-20 DIAGNOSIS — R262 Difficulty in walking, not elsewhere classified: Secondary | ICD-10-CM

## 2018-05-21 ENCOUNTER — Encounter: Payer: PRIVATE HEALTH INSURANCE | Admitting: Occupational Therapy

## 2018-05-21 ENCOUNTER — Encounter: Payer: Self-pay | Admitting: Physical Therapy

## 2018-05-21 ENCOUNTER — Ambulatory Visit: Payer: Medicare Other | Admitting: Physical Therapy

## 2018-05-21 DIAGNOSIS — R278 Other lack of coordination: Secondary | ICD-10-CM | POA: Diagnosis not present

## 2018-05-21 DIAGNOSIS — M6281 Muscle weakness (generalized): Secondary | ICD-10-CM | POA: Diagnosis not present

## 2018-05-21 DIAGNOSIS — R262 Difficulty in walking, not elsewhere classified: Secondary | ICD-10-CM

## 2018-05-21 DIAGNOSIS — R2681 Unsteadiness on feet: Secondary | ICD-10-CM | POA: Diagnosis not present

## 2018-05-21 DIAGNOSIS — I69354 Hemiplegia and hemiparesis following cerebral infarction affecting left non-dominant side: Secondary | ICD-10-CM | POA: Diagnosis not present

## 2018-05-21 NOTE — Therapy (Signed)
[facility] [address] [place], [place], [address] Phone: [phone]   Fax:  [phone]  Physical Therapy Treatment  Patient Details  Name: Duane Park MRN: [id number] Date of Birth: 1931/10/19 Referring Provider: Dr. Alysia Penna   Encounter Date: 05/20/2018  PT End of Session - 05/20/18 1107    Visit Number  7    Number of Visits  17    Date for PT Re-Evaluation  06/22/18    Authorization Type  Medicare and [employer] F covered 100%; VL: follow Medicare guidelines    PT Start Time  1105    PT Stop Time  1145    PT Time Calculation (min)  40 min    Equipment Utilized During Treatment  Gait belt    Activity Tolerance  Patient tolerated treatment well    Behavior During Therapy  WFL for tasks assessed/performed       Past Medical History:  Diagnosis Date  . COPD (chronic obstructive pulmonary disease) ([place])   . Hypercholesteremia   . Hypertension     Past Surgical History:  Procedure Laterality Date  . BACK SURGERY    . JOINT REPLACEMENT     arthroscopic surgery rt side    Vitals:   05/20/18 1106  BP: (!) 155/81  Pulse: 66    Subjective Assessment - 05/20/18 1106    Subjective  No new complaints. No falls. States pain "as usual".    Pertinent History  HTN, hyperlipidemia, COPD, back surgery, R knee arthroscopic surgery, chronic back and knee pain    Limitations  Walking    Patient Stated Goals  To improve strength and stability in legs -  work around a bad back, bad shoulder and bad knees    Pain Score  3     Pain Location  Back    Pain Orientation  Mid;Lower    Pain Descriptors / Indicators  Aching    Pain Type  Chronic pain    Pain Onset  More than a month ago    Pain Frequency  Intermittent    Aggravating Factors   increased activity    Pain Relieving Factors  rest, tylenol         [facility] PT Assessment - 05/20/18 1112      Ambulation/Gait   Ambulation/Gait  Yes     Assistive device  Straight cane    Stairs  Yes    Stairs Assistance  6: Modified independent (Device/Increase time)    Stair Management Technique  One rail Right;Alternating pattern;Forwards    Number of Stairs  4    Height of Stairs  6      Standardized Balance Assessment   Standardized Balance Assessment  Berg Balance Test;Five Times Sit to Stand;10 meter walk test    Five times sit to stand comments   17.40 with no UE support form standard height mat    10 Meter Walk  11.50= 2.85 ft/sec with cane      Berg Balance Test   Sit to Stand  Able to stand without using hands and stabilize independently    Standing Unsupported  Able to stand safely 2 minutes    Sitting with Back Unsupported but Feet Supported on Floor or Stool  Able to sit safely and securely 2 minutes    Stand to Sit  Sits safely with minimal use of hands    Transfers  Able to transfer safely, minor use of hands    Standing  Unsupported with Eyes Closed  Able to stand 10 seconds safely    Standing Ubsupported with Feet Together  Able to place feet together independently and stand 1 minute safely    From Standing, Reach Forward with Outstretched Arm  Can reach confidently >25 cm (10")    From Standing Position, Pick up Object from [place] to pick up shoe safely and easily    From Standing Position, Turn to Look Behind Over each Shoulder  Turn sideways only but maintains balance   limited by back issues/pain   Turn 360 Degrees  Able to turn 360 degrees safely but slowly    Standing Unsupported, Alternately Place Feet on Step/Stool  Able to stand independently and safely and complete 8 steps in 20 seconds   15.10 sec's   Standing Unsupported, One Foot in Front  Able to plae foot ahead of the other independently and hold 30 seconds    Standing on One Leg  Able to lift leg independently and hold equal to or more than 3 seconds    Total Score  49          05/20/18 1112  Ambulation/Gait  Ambulation/Gait Yes   Ambulation/Gait Assistance 5: Supervision;4: Min guard  Ambulation/Gait Assistance Details cues on posture, weight shifting, and cane placment. min gaurd assist for safety on outdoor compliant surfaces.          Ambulation Distance (Feet) 350 Feet  Assistive device Straight cane  Gait Pattern Step-through pattern;Decreased step length - left;Decreased stance time - left;Decreased stride length;Decreased weight shift to left;Lateral trunk lean to right  Ambulation Surface Level;Indoor;Unlevel;Outdoor;Paved;Gravel;Grass       05/20/18 1135  Balance Exercises: Standing  Standing Eyes Closed Wide (BOA);Foam/compliant surface;2 reps;30 secs;Limitations  Sidestepping Foam/compliant support;3 reps;Limitations  Balance Exercises: Standing  Sidestepping Limitations on blue foam beam in parallel bars x 3 laps, intermittent touch to bars for balance. min guard to min assist for balance with cues on posture, step length and step length.  Standing Eyes Closed Limitations on airex in parallel bars: wide stance with EC no head movements, no UE support with min guard to min assist for balance.              PT Short Term Goals - 05/20/18 1111      PT SHORT TERM GOAL #1   Title  Pt will perform initial HEP with supervision of wife  (ALL STG DUE BY 05/23/18)    Baseline  05/20/18: met today    Status  Achieved      PT SHORT TERM GOAL #2   Title  Pt will improve LE strength to perform five time sit to stand without use of UE from mat to </= 15 seconds    Baseline  --    Time  4    Period  Weeks    Status  Partially Met      PT SHORT TERM GOAL #3   Title  Pt will demonstrate decreased falls risk with increase in BERG to >/ = 46/56    Baseline  49/56    Time  --    Period  --    Status  Achieved      PT SHORT TERM GOAL #4   Title  Pt will improve gait velocity with RW to >/= 2.6 ft/sec to decrease falls risk    Baseline  --    Time  4    Period  Weeks    Status  Achieved  PT  SHORT TERM GOAL #5   Title  Pt will negotiate 3 stairs with rails, alternating sequence with supervision    Status  Achieved        PT Long Term Goals - 04/23/18 1622      PT LONG TERM GOAL #1   Title  Pt will be independent with final HEP    Time  8    Period  Weeks    Status  New    Target Date  06/22/18      PT LONG TERM GOAL #2   Title  Pt will increase LE strength to perform five time sit to stand in </= 13 seconds without UE support    Time  8    Period  Weeks    Status  New    Target Date  06/22/18      PT LONG TERM GOAL #3   Title  Pt will increase BERG to >/= 52/56 to decrease falls risk to low    Time  8    Period  Weeks    Target Date  06/22/18      PT LONG TERM GOAL #4   Title  Pt will increase gait velocity without AD to >/= 2.8 ft/sec to decrease risk for falls in community    Baseline  2.69    Time  8    Period  Weeks    Status  New    Target Date  06/22/18      PT LONG TERM GOAL #5   Title  Pt will ambulate x 500' outside over paved surfaces and negotiate 4 stairs, alternating sequence MOD I    Time  8    Period  Weeks    Status  New    Target Date  06/22/18         05/20/18 1107  Plan  Clinical Impression Statement Today's skilled session focused on progress toward STGs with 4 of 5 goals met, other one partially met. Remainder of the session focused on balance reactions with time remaining. No increase in pain reported with session today. Pt is progressing toward goals and should benefit from continued PT to progress toward unmet goals.   Pt will benefit from skilled therapeutic intervention in order to improve on the following deficits Abnormal gait;Decreased activity tolerance;Decreased balance;Decreased coordination;Decreased endurance;Decreased strength;Difficulty walking;Pain  Rehab Potential Good  PT Frequency 2x / week  PT Duration 8 weeks  PT Treatment/Interventions ADLs/Self Care Home Management;Aquatic Therapy;Electrical Stimulation;DME  Instruction;Gait training;Stair training;Functional mobility training;Therapeutic activities;Therapeutic exercise;Balance training;Neuromuscular re-education;Patient/family education  PT Next Visit Plan continue to address balance reactions, activity tolerance  PT Home Exercise Plan [facility]   Consulted and Agree with Plan of Care Patient  Family Member Consulted wife          Patient will benefit from skilled therapeutic intervention in order to improve the following deficits and impairments:  Abnormal gait, Decreased activity tolerance, Decreased balance, Decreased coordination, Decreased endurance, Decreased strength, Difficulty walking, Pain  Visit Diagnosis:     Problem List Patient Active Problem List   Diagnosis Date Noted  . Small vessel disease ([place]) 04/14/2018  . Diastolic dysfunction   . Stage 3 chronic kidney disease ([place])   . Aortic atherosclerosis ([place]) 04/13/2018  . Cerebral thrombosis with cerebral infarction 04/13/2018  . Cerebrovascular accident (CVA) ([place])   . Dyslipidemia   . Essential hypertension   . Gastroesophageal reflux disease   . Weakness 04/12/2018  . Chronic cough 03/28/2018  .  OSTEOARTHRITIS, BACK 09/13/2009  . ARTHRITIS, KNEES, BILATERAL 09/13/2009  . 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 ABNORMALITY OF GAIT 09/13/2009    Willow Ora, PTA, [facility] [address] [place], [place] [address] [phone] 05/22/18, 8:58 AM   Name: Duane Park MRN: [id number] Date of Birth: [birth year]

## 2018-05-21 NOTE — Therapy (Signed)
[facility] [address] [place], [place], [address] Phone: [phone]   Fax:  [phone]  Physical Therapy Treatment  Patient Details  Name: Duane Park MRN: [id number] Date of Birth: 22-Feb-1932 Referring Provider: Dr. Alysia Penna   Encounter Date: 05/21/2018  PT End of Session - 05/21/18 1152    Visit Number  8    Number of Visits  17    Date for PT Re-Evaluation  06/22/18    Authorization Type  Medicare and [employer] F covered 100%; VL: follow Medicare guidelines    PT Start Time  6734    PT Stop Time  1230    PT Time Calculation (min)  41 min    Equipment Utilized During Treatment  Gait belt    Activity Tolerance  Patient tolerated treatment well    Behavior During Therapy  WFL for tasks assessed/performed       Past Medical History:  Diagnosis Date  . COPD (chronic obstructive pulmonary disease) ([place])   . Hypercholesteremia   . Hypertension     Past Surgical History:  Procedure Laterality Date  . BACK SURGERY    . JOINT REPLACEMENT     arthroscopic surgery rt side    There were no vitals filed for this visit.  Subjective Assessment - 05/21/18 1150    Subjective  No new complaints. No falls. States pain "about the same".    Patient is accompained by:  Family member    Pertinent History  HTN, hyperlipidemia, COPD, back surgery, R knee arthroscopic surgery, chronic back and knee pain    Limitations  Walking    Patient Stated Goals  To improve strength and stability in legs -  work around a bad back, bad shoulder and bad knees    Currently in Pain?  Yes    Pain Score  3     Pain Location  Back    Pain Orientation  Mid;Lower    Pain Descriptors / Indicators  Aching    Pain Type  Chronic pain    Pain Radiating Towards  down hips/legs, left>right    Pain Frequency  Intermittent    Aggravating Factors   increased activity    Pain Relieving Factors  rest, tylenol    Multiple Pain  Sites  Yes    Pain Score  2    Pain Location  Knee    Pain Orientation  Left    Pain Descriptors / Indicators  Aching    Pain Type  Chronic pain    Pain Onset  More than a month ago    Pain Frequency  Constant    Aggravating Factors   some exercises            [facility] Adult PT Treatment/Exercise - 05/21/18 1155      Transfers   Transfers  Sit to Stand;Stand to Sit    Sit to Stand  6: Modified independent (Device/Increase time)    Stand to Sit  6: Modified independent (Device/Increase time)      Ambulation/Gait   Ambulation/Gait  Yes    Ambulation/Gait Assistance  5: Supervision;4: Min guard    Ambulation/Gait Assistance Details  min guard assist for gait on gravel/grass, otherwise supervision. cues for posture and cane placement at times.     Ambulation Distance (Feet)  350 Feet    Assistive device  Straight cane    Gait Pattern  Step-through pattern;Decreased step length - left;Decreased stance time - left;Decreased stride  length;Decreased weight shift to left;Lateral trunk lean to right    Ambulation Surface  Level;Unlevel;Indoor;Outdoor;Paved;Gravel;Grass      High Level Balance   High Level Balance Activities  Marching forwards;Marching backwards;Tandem walking;Side stepping   tandem fwd/bwd   High Level Balance Comments  on both red mats next to counter: 3 laps each with intermittent UE support on counter for balance. cues on posture and ex form needed. min gaurd to min assist for balance.           Balance Exercises - 05/21/18 1209      Balance Exercises: Standing   Standing Eyes Closed  Narrow base of support (BOS);Wide (BOA);Head turns;Foam/compliant surface;Limitations;30 secs;Other reps (comment)    Balance Beam  standing across blue foam beam: alternating fwd stepping to floor/back onto beam, then alternating bwd stepping to floor/back onto beam. cues on increased step length/height and for weight shifting. min gaurd to min assist for balance with no UE support.        Balance Exercises: Standing   Standing Eyes Closed Limitations  on airex in parallel bars with no UE support: narrow base of support with EC no head movements, progressing to wide base of support with EC for head movements left<>right, up<>down. min guard to min assist for balance. ,           PT Short Term Goals - 05/20/18 1111      PT SHORT TERM GOAL #1   Title  Pt will perform initial HEP with supervision of wife  (ALL STG DUE BY 05/23/18)    Baseline  05/20/18: met today    Status  Achieved      PT SHORT TERM GOAL #2   Title  Pt will improve LE strength to perform five time sit to stand without use of UE from mat to </= 15 seconds    Baseline  --    Time  4    Period  Weeks    Status  Partially Met      PT SHORT TERM GOAL #3   Title  Pt will demonstrate decreased falls risk with increase in BERG to >/ = 46/56    Baseline  49/56    Time  --    Period  --    Status  Achieved      PT SHORT TERM GOAL #4   Title  Pt will improve gait velocity with RW to >/= 2.6 ft/sec to decrease falls risk    Baseline  --    Time  4    Period  Weeks    Status  Achieved      PT SHORT TERM GOAL #5   Title  Pt will negotiate 3 stairs with rails, alternating sequence with supervision    Status  Achieved        PT Long Term Goals - 04/23/18 1622      PT LONG TERM GOAL #1   Title  Pt will be independent with final HEP    Time  8    Period  Weeks    Status  New    Target Date  06/22/18      PT LONG TERM GOAL #2   Title  Pt will increase LE strength to perform five time sit to stand in </= 13 seconds without UE support    Time  8    Period  Weeks    Status  New    Target Date  06/22/18  PT LONG TERM GOAL #3   Title  Pt will increase BERG to >/= 52/56 to decrease falls risk to low    Time  8    Period  Weeks    Target Date  06/22/18      PT LONG TERM GOAL #4   Title  Pt will increase gait velocity without AD to >/= 2.8 ft/sec to decrease risk for falls in community     Baseline  2.69    Time  8    Period  Weeks    Status  New    Target Date  06/22/18      PT LONG TERM GOAL #5   Title  Pt will ambulate x 500' outside over paved surfaces and negotiate 4 stairs, alternating sequence MOD I    Time  8    Period  Weeks    Status  New    Target Date  06/22/18            Plan - 05/21/18 1152    Clinical Impression Statement  Today's skilled session continued to focus on gait on various surfaces and high level balance reactions. No increase in pain was reported in session today. The pt is progressing toward goals and should benefit from continued PT to progress toward unmet goals.     Rehab Potential  Good    PT Frequency  2x / week    PT Duration  8 weeks    PT Treatment/Interventions  ADLs/Self Care Home Management;Aquatic Therapy;Electrical Stimulation;DME Instruction;Gait training;Stair training;Functional mobility training;Therapeutic activities;Therapeutic exercise;Balance training;Neuromuscular re-education;Patient/family education    PT Next Visit Plan  continue to address balance reactions, activity tolerance    PT Home Exercise Plan  [facility]     Consulted and Agree with Plan of Care  Patient    Family Member Consulted  wife       Patient will benefit from skilled therapeutic intervention in order to improve the following deficits and impairments:  Abnormal gait, Decreased activity tolerance, Decreased balance, Decreased coordination, Decreased endurance, Decreased strength, Difficulty walking, Pain  Visit Diagnosis: Hemiplegia and hemiparesis following cerebral infarction affecting left non-dominant side (HCC)  Unsteadiness on feet  Difficulty in walking, not elsewhere classified  Muscle weakness (generalized)     Problem List Patient Active Problem List   Diagnosis Date Noted  . Small vessel disease ([place]) 04/14/2018  . Diastolic dysfunction   . Stage 3 chronic kidney disease ([place])   . Aortic atherosclerosis ([place])  04/13/2018  . Cerebral thrombosis with cerebral infarction 04/13/2018  . Cerebrovascular accident (CVA) ([place])   . Dyslipidemia   . Essential hypertension   . Gastroesophageal reflux disease   . Weakness 04/12/2018  . Chronic cough 03/28/2018  . OSTEOARTHRITIS, BACK 09/13/2009  . ARTHRITIS, KNEES, BILATERAL 09/13/2009  . 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 ABNORMALITY OF GAIT 09/13/2009    Willow Ora, PTA, [facility] [address] [place], [place] [address] [phone] 05/21/18, 10:49 PM   Name: EJAY LASHLEY MRN: [id number] Date of Birth: 1932-06-15

## 2018-05-22 ENCOUNTER — Ambulatory Visit (INDEPENDENT_AMBULATORY_CARE_PROVIDER_SITE_OTHER): Payer: Medicare Other | Admitting: Adult Health

## 2018-05-22 ENCOUNTER — Encounter: Payer: Self-pay | Admitting: Adult Health

## 2018-05-22 VITALS — BP 152/77 | HR 68 | Ht 71.0 in | Wt 209.0 lb

## 2018-05-22 DIAGNOSIS — I63511 Cerebral infarction due to unspecified occlusion or stenosis of right middle cerebral artery: Secondary | ICD-10-CM

## 2018-05-22 DIAGNOSIS — E785 Hyperlipidemia, unspecified: Secondary | ICD-10-CM | POA: Diagnosis not present

## 2018-05-22 DIAGNOSIS — I10 Essential (primary) hypertension: Secondary | ICD-10-CM

## 2018-05-22 MED ORDER — AMLODIPINE BESYLATE 2.5 MG PO TABS: 2.5000 mg | ORAL_TABLET | Freq: Every day | ORAL | 4 refills | Status: DC

## 2018-05-22 NOTE — Progress Notes (Signed)
[facility] [address] [place]. [place] [address] [phone]       OFFICE FOLLOW UP NOTE  [name] Date of Birth:  1931/11/08 Medical Record Number:  [id number]   Reason for Referral:  hospital stroke follow up  CHIEF COMPLAINT:  Chief Complaint  Patient presents with  . 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 History obtained from patient, wife and chart review. Reviewed all radiology images and labs personally.  Duane Park a 84 y.o.malepast medical history of hypertension, hyperlipidemia, COPD, who was in his usual state of health when he went to bed at 11:30 PM on 04/11/2018 and woke up the next morning with left facial droop, left arm and leg weakness and slurred speech.  Patient was initially brought to [facility] and then was transferred to [facility].  Unable to receive TPA due to late presentation.  CT head negative for acute abnormality.  MRI brain reviewed and showed 6 mm acute/early subacute infarction within the right central cerebral peduncle.  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  2D echo showed a normal ejection fraction without evidence of cardiac thrombus.  LDL 126 and his patient was not on statin PTA recommended Lipitor 80 mg daily.  HTN stable during admission and recommended long-term BP goal normotensive range.  A1c satisfactory at 5.3.  Patient was not on antithrombotic PTA and recommended aspirin 325 mg daily.  Patient was discharged to [facility] for continuation of therapies.   Patient is being seen today for hospital follow-up and is accompanied by his wife.  He has returned home from inpatient rehab and continues to do physical therapy at neuro rehab as he has graduated from occupational therapy.  He has recovered well with  only mild residual left upper extremity weakness.  He continues to take aspirin 81 mg with mild bruising but no bleeding.  Continues to take Lipitor without side effects of myalgias.  Blood pressure today elevated at 150/77.  This is monitored at home and wife states typically ranges from 1 40-1 60.  Patient is not currently on antihypertensive at this time.  Patient has returned back to most prior activities except for mowing the yard.  He did attempt driving 1 mile yesterday around his neighborhood ended while doing this.  Denies new or worsening stroke/TIA symptoms.  ROS:   14 system review of systems performed and negative with exception of double vision and joint pain  PMH:  Past Medical History:  Diagnosis Date  . COPD (chronic obstructive pulmonary disease) ([place])   . Hypercholesteremia   . Hypertension   . Stroke [facility])     PSH:  Past Surgical History:  Procedure Laterality Date  . BACK SURGERY    . JOINT REPLACEMENT     arthroscopic surgery rt side    Social History:  Social History   Socioeconomic History  . Marital status: Married    Spouse name: Not on file  . Number of children: Not on file  . Years of education: Not on file  . Highest education level: Not on file  Occupational History  . Not on file  Social Needs  . Financial resource strain: Not on file  . Food insecurity:    Worry: Not on file  Inability: Not on file  . Transportation needs:    Medical: Not on file    Non-medical: Not on file  Tobacco Use  . Smoking status: Never Smoker  . Smokeless tobacco: Never Used  Substance and Sexual Activity  . Alcohol use: Not Currently    Frequency: Never  . Drug use: Never  . Sexual activity: Not on file  Lifestyle  . Physical activity:    Days per week: Not on file    Minutes per session: Not on file  . Stress: Not on file  Relationships  . Social connections:    Talks on phone: Not on file    Gets together: Not on file    Attends religious  service: Not on file    Active member of club or organization: Not on file    Attends meetings of clubs or organizations: Not on file    Relationship status: Not on file  . Intimate partner violence:    Fear of current or ex partner: Not on file    Emotionally abused: Not on file    Physically abused: Not on file    Forced sexual activity: Not on file  Other Topics Concern  . Not on file  Social History Narrative  . Not on file    Family History:  Family History  Problem Relation Age of Onset  . Lung disease Mother        never smoker  . Heart disease Father     Medications:   Current Outpatient Medications on File Prior to Visit  Medication Sig Dispense Refill  . acetaminophen (TYLENOL) 325 MG tablet Take 1-2 tablets (325-650 mg total) by mouth every 4 (four) hours as needed for mild pain.    Marland Kitchen aspirin EC 81 MG EC tablet Take 1 tablet (81 mg total) by mouth daily. 30 tablet 3  . atorvastatin (LIPITOR) 80 MG tablet Take 1 tablet (80 mg total) by mouth daily at 6 PM. 30 tablet 3  . Multiple Vitamin (MULTIVITAMIN) tablet Take 1 tablet by mouth daily.    . pantoprazole (PROTONIX) 40 MG tablet Take 2 tablets (80 mg total) by mouth daily. 90 tablet 3  . psyllium (METAMUCIL) 58.6 % powder Take 1 packet by mouth daily.     No current facility-administered medications on file prior to visit.     Allergies:  No Known Allergies   Physical Exam  Vitals:   05/22/18 1245  BP: (!) 152/77  Pulse: 68  Weight: 209 lb (94.8 kg)  Height: 5\' 11"  (1.803 m)   Body mass index is 29.15 kg/m. No exam data present  General: well developed, well nourished, pleasant elderly Caucasian male, seated, in no evident distress Head: head normocephalic and atraumatic.   Neck: supple with no carotid or supraclavicular bruits Cardiovascular: regular rate and rhythm, no murmurs Musculoskeletal: no deformity Skin:  no rash/petichiae Vascular:  Normal pulses all extremities  Neurologic  Exam Mental Status: Awake and fully alert. Oriented to place and time. Recent and remote memory intact. Attention span, concentration and fund of knowledge appropriate. Mood and affect appropriate.  Cranial Nerves: Fundoscopic exam reveals sharp disc margins. Pupils equal, briskly reactive to light. Extraocular movements full without nystagmus. Visual fields full to confrontation. Hearing intact. Facial sensation intact. Face, tongue, palate moves normally and symmetrically.  Motor: Normal bulk and tone. Normal strength in all tested extremity muscles. Sensory.: intact to touch , pinprick , position and vibratory sensation.  Coordination: Rapid alternating movements normal in  all extremities. Finger-to-nose and heel-to-shin performed accurately bilaterally.  Decreased left hand dexterity.  Orbits right arm over left arm. Gait and Station: Arises from chair without difficulty. Stance is normal. Gait demonstrates normal stride length and balance . Able to heel, toe and tandem walk without difficulty.  Reflexes: 1+ and symmetric. Toes downgoing.    NIHSS  0 Modified Rankin  1   Diagnostic Data (Labs, Imaging, Testing)  Ct Angio Head W Or Wo Contrast Ct Angio Neck W Or Wo Contrast Ct Cerebral Perfusion W Contrast 04/12/2018 IMPRESSION:  1. No emergent large vessel occlusion.  2. No significant proximal intracranial arterial stenosis.  3. Mild cervical carotid artery atherosclerosis without stenosis.  4. Mild proximal right vertebral artery stenosis. Hypoplastic left vertebral artery.    Mr Brain Wo Contrast 04/13/2018 IMPRESSION:  1. 6 mm acute/early subacute infarction within the right central cerebral peduncle. No associated hemorrhage or mass effect.  2. Mild volume loss of the brain for age.  3. Mild ethmoid and maxillary sinus disease.    Ct Head Code Stroke Wo Contrast 04/12/2018 IMPRESSION:  1. Unremarkable CT appearance of the brain for age. 2. ASPECTS is 10.    ECHOCARDIOGRAM 04/13/2018 Study Conclusions - Left ventricle: The cavity size was normal. Wall thickness was   increased in a pattern of mild LVH. Systolic function was normal.   The estimated ejection fraction was in the range of 55% to 60%.   Wall motion was normal; there were no regional wall motion   abnormalities. Doppler parameters are consistent with abnormal   left ventricular relaxation (grade 1 diastolic dysfunction).   ASSESSMENT: Duane Park is a 84 y.o. year old male here with right central cerebral peduncle infarct on 04/12/2018 secondary to small vessel disease. Vascular risk factors include HTN and HLD.  Patient returns today for hospital follow-up and overall is doing well with only mild residual deficit of left upper extremity weakness.    PLAN: -Continue aspirin 81 mg daily  and Lipitor 80 mg for secondary stroke prevention -F/u with PCP regarding your HLD and HTN management -Due to continued elevated blood pressure, recommend to start amlodipine 2.5 mg daily.  Advised wife to call next week to notify us regarding his blood pressure levels.   -Continue outpatient physical therapy -continue to monitor BP at home -advised to continue to stay active and maintain a healthy diet -Maintain strict control of hypertension with blood pressure goal below 130/90, diabetes with hemoglobin A1c goal below 6.5% and cholesterol with LDL cholesterol (bad cholesterol) goal below 70 mg/dL. I also advised the patient to eat a healthy diet with plenty of whole grains, cereals, fruits and vegetables, exercise regularly and maintain ideal body weight.  Follow up in 3 months or call earlier if needed   Greater than 50% of time during this 25 minute visit was spent on counseling,explanation of diagnosis of right central cerebral peduncle infarct, reviewing risk factor management of HTN and HLD, planning of further management, discussion with patient and family and coordination of  care    Venancio Poisson, AGNP-BC  [facility] [address] [place], [place] [address]  Phone [phone] Fax [phone] Note: This document was prepared with digital dictation and possible smart phrase technology. Any transcriptional errors that result from this process are unintentional.

## 2018-05-22 NOTE — Patient Instructions (Addendum)
Continue aspirin 81 mg daily  and lipitor  for secondary stroke prevention  Start amlodipine 2.5mg  daily - BP goal less than 130/90. After 1 week, call office to let us know how the blood pressure is doing.   Continue to follow up with PCP regarding cholesterol and blood pressure management   Continue to monitor blood pressure at home and write down the numbers  Maintain strict control of hypertension with blood pressure goal below 130/90, diabetes with hemoglobin A1c goal below 6.5% and cholesterol with LDL cholesterol (bad cholesterol) goal below 70 mg/dL. I also advised the patient to eat a healthy diet with plenty of whole grains, cereals, fruits and vegetables, exercise regularly and maintain ideal body weight.  Followup in the future with me in 3 months or call earlier if needed        Thank you for coming to see [place] at [facility]. I hope we have been able to provide you high quality care today.  You may receive a patient satisfaction survey over the next few weeks. We would appreciate your feedback and comments so that we may continue to improve ourselves and the health of our patients.     Stroke Prevention Some health problems and behaviors may make it more likely for you to have a stroke. Below are ways to lessen your risk of having a stroke.  Be active for at least 30 minutes on most or all days.  Do not smoke. Try not to be around others who smoke.  Do not drink too much alcohol. ? Do not have more than 2 drinks a day if you are a man. ? Do not have more than 1 drink a day if you are a woman and are not pregnant.  Eat healthy foods, such as fruits and vegetables. If you were put on a specific diet, follow the diet as told.  Keep your cholesterol levels under control through diet and medicines. Look for foods that are low in saturated fat, trans fat, cholesterol, and are high in fiber.  If you have diabetes, follow all diet plans and take your  medicine as told.  Ask your doctor if you need treatment to lower your blood pressure. If you have high blood pressure (hypertension), follow all diet plans and take your medicine as told by your doctor.  If you are 29-6 years old, have your blood pressure checked every 3-5 years. If you are age 73 or older, have your blood pressure checked every year.  Keep a healthy weight. Eat foods that are low in calories, salt, saturated fat, trans fat, and cholesterol.  Do not take drugs.  Avoid birth control pills, if this applies. Talk to your doctor about the risks of taking birth control pills.  Talk to your doctor if you have sleep problems (sleep apnea).  Take all medicine as told by your doctor. ? You may be told to take aspirin or blood thinner medicine. Take this medicine as told by your doctor. ? Understand your medicine instructions.  Make sure any other conditions you have are being taken care of.  Get help right away if:  You suddenly lose feeling (you feel numb) or have weakness in your face, arm, or leg.  Your face or eyelid hangs down to one side.  You suddenly feel confused.  You have trouble talking (aphasia) or understanding what people are saying.  You suddenly have trouble seeing in one or both eyes.  You suddenly have trouble walking.  You are dizzy.  You lose your balance or your movements are clumsy (uncoordinated).  You suddenly have a very bad headache and you do not know the cause.  You have new chest pain.  Your heart feels like it is fluttering or skipping a beat (irregular heartbeat). Do not wait to see if the symptoms above go away. Get help right away. Call your local emergency services (911 in [place] Do not drive yourself to the hospital. This information is not intended to replace advice given to you by your health care provider. Make sure you discuss any questions you have with your health care provider. Document Released: 02/26/2012 Document  Revised: 02/02/2016 Document Reviewed: 02/27/2013 Elsevier Interactive Patient Education  [employer].

## 2018-05-23 DIAGNOSIS — Z6831 Body mass index (BMI) 31.0-31.9, adult: Secondary | ICD-10-CM | POA: Diagnosis not present

## 2018-05-23 DIAGNOSIS — E669 Obesity, unspecified: Secondary | ICD-10-CM | POA: Diagnosis not present

## 2018-05-23 DIAGNOSIS — I10 Essential (primary) hypertension: Secondary | ICD-10-CM | POA: Diagnosis not present

## 2018-05-25 NOTE — Progress Notes (Signed)
I agree with the above plan 

## 2018-05-26 ENCOUNTER — Encounter: Payer: Self-pay | Admitting: Physical Therapy

## 2018-05-26 ENCOUNTER — Encounter: Payer: PRIVATE HEALTH INSURANCE | Admitting: Occupational Therapy

## 2018-05-26 ENCOUNTER — Ambulatory Visit: Payer: Medicare Other | Admitting: Physical Therapy

## 2018-05-26 DIAGNOSIS — M6281 Muscle weakness (generalized): Secondary | ICD-10-CM | POA: Diagnosis not present

## 2018-05-26 DIAGNOSIS — R278 Other lack of coordination: Secondary | ICD-10-CM | POA: Diagnosis not present

## 2018-05-26 DIAGNOSIS — I69354 Hemiplegia and hemiparesis following cerebral infarction affecting left non-dominant side: Secondary | ICD-10-CM | POA: Diagnosis not present

## 2018-05-26 DIAGNOSIS — R262 Difficulty in walking, not elsewhere classified: Secondary | ICD-10-CM | POA: Diagnosis not present

## 2018-05-26 DIAGNOSIS — R2681 Unsteadiness on feet: Secondary | ICD-10-CM

## 2018-05-28 ENCOUNTER — Encounter: Payer: PRIVATE HEALTH INSURANCE | Admitting: Occupational Therapy

## 2018-05-28 ENCOUNTER — Ambulatory Visit: Payer: PRIVATE HEALTH INSURANCE | Admitting: *Deleted

## 2018-05-28 NOTE — Therapy (Signed)
[facility] [address] [place], [place], [address] Phone: [phone]   Fax:  [phone]  Physical Therapy Treatment  Patient Details  Name: Duane Park MRN: [id number] Date of Birth: 08/13/1932 Referring Provider: Dr. Alysia Penna   Encounter Date: 05/26/2018   05/26/18 1153  PT Visits / Re-Eval  Visit Number 9  Number of Visits 17  Date for PT Re-Evaluation 06/22/18  Authorization  Authorization Type Medicare and [employer] F covered 100%; VL: follow Medicare guidelines  PT Time Calculation  PT Start Time 1105  PT Stop Time 1143  PT Time Calculation (min) 38 min  PT - End of Session  Equipment Utilized During Treatment Gait belt  Activity Tolerance Patient tolerated treatment well  Behavior During Therapy WFL for tasks assessed/performed     Past Medical History:  Diagnosis Date  . COPD (chronic obstructive pulmonary disease) ([place])   . Hypercholesteremia   . Hypertension   . Stroke [facility])     Past Surgical History:  Procedure Laterality Date  . BACK SURGERY    . JOINT REPLACEMENT     arthroscopic surgery rt side    There were no vitals filed for this visit.     05/26/18 1152  Symptoms/Limitations  Subjective No new complaints. No falls. Did start new BP meds as the NP wants his systolic below 638, in the 140's now.   Patient is accompained by: Family member  Pertinent History HTN, hyperlipidemia, COPD, back surgery, R knee arthroscopic surgery, chronic back and knee pain  Limitations Walking  Patient Stated Goals To improve strength and stability in legs -  work around a bad back, bad shoulder and bad knees  Pain Assessment  Currently in Pain? Yes  Pain Score 3  Pain Location Back  Pain Orientation Mid;Lower  Pain Descriptors / Indicators Aching  Pain Type Chronic pain  Pain Radiating Towards down into hips/legs- mostly in am, not any right now  Pain Onset More than a month  ago  Pain Frequency Intermittent  Aggravating Factors  increased activity  Pain Relieving Factors rest, tylenol      05/26/18 1154  Ambulation/Gait  Ambulation/Gait Yes  Ambulation/Gait Assistance 4: Min guard  Ambulation/Gait Assistance Details pt is mod I with cane for gait. transitioned to no AD with gait in session today with min guard assist needed for balance.   Ambulation Distance (Feet) 230 Feet (x1 )  Assistive device None  Gait Pattern Step-through pattern;Decreased step length - left;Decreased stance time - left;Decreased stride length;Decreased weight shift to left;Lateral trunk lean to right  Ambulation Surface Level;Indoor  Neuro Re-ed   Neuro Re-ed Details  for balance/re-ed: along ~50 foot hallway- fwd gait with head movements with cane support/ min guard assist; with no AD:  gait on track while scanning enviroment, speed changes all with min guard assist to min assist; progresed to self tossing ball/catching it while walking on track with cues to task and up to min assist needed for balance.        05/26/18 1225  Balance Exercises: Standing  Standing Eyes Closed Narrow base of support (BOS);Wide (BOA);Head turns;Foam/compliant surface;Limitations;30 secs;Other reps (comment)  SLS with Vectors Foam/compliant surface;Intermittent upper extremity assist;Other reps (comment);Limitations  Balance Exercises: Standing  SLS with Vectors Limitations 2 tall cones on red mat next to counter top: alternating fwd toe taps. alternating cross toe taps, progressing to alternating fwd double toe taps, alternating cross double toe taps. min assist to mod assist  for balance with intermittent touch to counter for balance. cues on stance position and weight shifting to assist with balance recovery.   Standing Eyes Closed Limitations on open dense blue mat in corner with a chair in front for safety: wide base of support progressing to narrow base of support for EC no head movements,  progressing to EC head movements left<>right, then up<>down. min guard to min assist for balance with cues on posture and weight shifting to assist with balance recovery.           PT Short Term Goals - 05/20/18 1111      PT SHORT TERM GOAL #1   Title  Pt will perform initial HEP with supervision of wife  (ALL STG DUE BY 05/23/18)    Baseline  05/20/18: met today    Status  Achieved      PT SHORT TERM GOAL #2   Title  Pt will improve LE strength to perform five time sit to stand without use of UE from mat to </= 15 seconds    Baseline  05/20/18: 17.40 sec's no UE use from standard height mat table- improved just not to goal    Time  --    Period  --    Status  Partially Met      PT SHORT TERM GOAL #3   Title  Pt will demonstrate decreased falls risk with increase in BERG to >/ = 46/56    Baseline  05/20/18: scored 49/56 today    Time  --    Period  --    Status  Achieved      PT SHORT TERM GOAL #4   Title  Pt will improve gait velocity with RW to >/= 2.6 ft/sec to decrease falls risk    Baseline  05/20/18: 2.85 ft/sec with cane    Time  --    Period  --    Status  Achieved      PT SHORT TERM GOAL #5   Title  Pt will negotiate 3 stairs with rails, alternating sequence with supervision    Baseline  05/20/18: met today    Status  Achieved        PT Long Term Goals - 04/23/18 1622      PT LONG TERM GOAL #1   Title  Pt will be independent with final HEP    Time  8    Period  Weeks    Status  New    Target Date  06/22/18      PT LONG TERM GOAL #2   Title  Pt will increase LE strength to perform five time sit to stand in </= 13 seconds without UE support    Time  8    Period  Weeks    Status  New    Target Date  06/22/18      PT LONG TERM GOAL #3   Title  Pt will increase BERG to >/= 52/56 to decrease falls risk to low    Time  8    Period  Weeks    Target Date  06/22/18      PT LONG TERM GOAL #4   Title  Pt will increase gait velocity without AD to >/= 2.8  ft/sec to decrease risk for falls in community    Baseline  2.69    Time  8    Period  Weeks    Status  New    Target Date  06/22/18  PT LONG TERM GOAL #5   Title  Pt will ambulate x 500' outside over paved surfaces and negotiate 4 stairs, alternating sequence MOD I    Time  8    Period  Weeks    Status  New    Target Date  06/22/18          05/26/18 1700  Plan  Clinical Impression Statement Today's skilled session progressed gait from using a straight cane to no AD on indoor level surfaces with no significant balance issues noted. Remainder of session continued to address dynamic gait and balance reactions with up to min assist needed for balance at times. The pt is progressing toward goals and should benefit from continued PT to progress toward unmet goals.   Pt will benefit from skilled therapeutic intervention in order to improve on the following deficits Abnormal gait;Decreased activity tolerance;Decreased balance;Decreased coordination;Decreased endurance;Decreased strength;Difficulty walking;Pain  Rehab Potential Good  PT Frequency 2x / week  PT Duration 8 weeks  PT Treatment/Interventions ADLs/Self Care Home Management;Aquatic Therapy;Electrical Stimulation;DME Instruction;Gait training;Stair training;Functional mobility training;Therapeutic activities;Therapeutic exercise;Balance training;Neuromuscular re-education;Patient/family education  PT Next Visit Plan 10th visist progress note due next visit; continue to address balance reactions, activity tolerance  PT Home Exercise Plan [facility]   Consulted and Agree with Plan of Care Patient  Family Member Consulted wife         Patient will benefit from skilled therapeutic intervention in order to improve the following deficits and impairments:  Abnormal gait, Decreased activity tolerance, Decreased balance, Decreased coordination, Decreased endurance, Decreased strength, Difficulty walking, Pain  Visit  Diagnosis: Hemiplegia and hemiparesis following cerebral infarction affecting left non-dominant side (HCC)  Unsteadiness on feet  Difficulty in walking, not elsewhere classified  Muscle weakness (generalized)     Problem List Patient Active Problem List   Diagnosis Date Noted  . Small vessel disease ([place]) 04/14/2018  . Diastolic dysfunction   . Stage 3 chronic kidney disease ([place])   . Aortic atherosclerosis ([place]) 04/13/2018  . Cerebral thrombosis with cerebral infarction 04/13/2018  . Cerebrovascular accident (CVA) ([place])   . Dyslipidemia   . Essential hypertension   . Gastroesophageal reflux disease   . Weakness 04/12/2018  . Chronic cough 03/28/2018  . OSTEOARTHRITIS, BACK 09/13/2009  . ARTHRITIS, KNEES, BILATERAL 09/13/2009  . 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 ABNORMALITY OF GAIT 09/13/2009    Willow Ora, PTA, [facility] [address], [place], [place] [address] [phone] 05/28/18, 12:34 PM   Name: Duane Park MRN: [id number] Date of Birth: Feb 21, 1932

## 2018-05-29 ENCOUNTER — Telehealth: Payer: Self-pay | Admitting: Adult Health

## 2018-05-29 NOTE — Telephone Encounter (Signed)
Return phone call to Duane Park regarding blood pressure.  Amlodipine 2.5 mg was started on 05/23/2018 after follow-up visit on 05/22/2018 as blood pressure readings at home SBP 150-160.  She does monitor this twice daily.  States on Tuesday, 9/17, blood pressure reading 146/72 at 7 AM and 141/65 at 7 PM.  Wednesday, 9/18, 135/69 at 7 AM and 140/71 at 7 PM.  This morning blood pressure 126/65 at 7 AM.  She did recheck this at 10 AM and at 134/68.  All a.m. readings were after medication administration.  She is concerned because she felt as though this blood pressure should be less than the readings that she has been obtaining.  As this medication was started 7 days ago and today's reading being satisfactory, recommended for her to continue monitoring for tonight and tomorrow morning.  Patient does have appointment with physical therapy at 1145 am and wife will stop into office at 1115am to discuss readings and possible medication adjustment.  Will consider increasing to amlodipine 5 mg if needed.  Reeducated wife that we did start medication at a low dose as we did not want blood pressure to be decreased to quickly.  Also informed her that readings between 1 20-1 40 are satisfactory at this time.  Wife appreciated call back and no further concerns at this time.

## 2018-05-29 NOTE — Telephone Encounter (Signed)
Sent to [place] NP.

## 2018-05-29 NOTE — Telephone Encounter (Signed)
Pts wife [name] called stating she was instructed to speak with NP today regarding pts BP requesting a call back to discuss.

## 2018-05-30 ENCOUNTER — Encounter: Payer: Self-pay | Admitting: Physical Therapy

## 2018-05-30 ENCOUNTER — Ambulatory Visit: Payer: Medicare Other | Admitting: Physical Therapy

## 2018-05-30 DIAGNOSIS — R262 Difficulty in walking, not elsewhere classified: Secondary | ICD-10-CM

## 2018-05-30 DIAGNOSIS — R2681 Unsteadiness on feet: Secondary | ICD-10-CM | POA: Diagnosis not present

## 2018-05-30 DIAGNOSIS — M6281 Muscle weakness (generalized): Secondary | ICD-10-CM

## 2018-05-30 DIAGNOSIS — R278 Other lack of coordination: Secondary | ICD-10-CM | POA: Diagnosis not present

## 2018-05-30 DIAGNOSIS — I69354 Hemiplegia and hemiparesis following cerebral infarction affecting left non-dominant side: Secondary | ICD-10-CM | POA: Diagnosis not present

## 2018-05-30 MED ORDER — AMLODIPINE BESYLATE 5 MG PO TABS: 5.0000 mg | ORAL_TABLET | Freq: Every day | ORAL | 1 refills | Status: DC

## 2018-05-30 NOTE — Therapy (Addendum)
[facility] [address] [place], [place], [address] Phone: [phone]   Fax:  [phone]  Physical Therapy Treatment/Progress Note  Patient Details  Name: Duane Park MRN: [id number] Date of Birth: Oct 25, 1931 Referring Provider: Dr. Alysia Penna   Encounter Date: 05/30/2018  PT End of Session - 05/30/18 1152    Visit Number  10    Number of Visits  17    Date for PT Re-Evaluation  06/22/18    Authorization Type  Medicare and [employer] F covered 100%; VL: follow Medicare guidelines    PT Start Time  0768    PT Stop Time  0881   session ended early due to pt's left hip pain   PT Time Calculation (min)  26 min    Equipment Utilized During Treatment  Gait belt    Activity Tolerance  Patient tolerated treatment well    Behavior During Therapy  WFL for tasks assessed/performed       Past Medical History:  Diagnosis Date  . COPD (chronic obstructive pulmonary disease) ([place])   . Hypercholesteremia   . Hypertension   . Stroke [facility])     Past Surgical History:  Procedure Laterality Date  . BACK SURGERY    . JOINT REPLACEMENT     arthroscopic surgery rt side    There were no vitals filed for this visit.  Subjective Assessment - 05/30/18 1151    Subjective  Has had increased pain since last session, continues to be 7-8/10 at this time. His BP has been up as well. Spoke with the NP who plans to increase his BP medication.    Patient is accompained by:  Family member    Pertinent History  HTN, hyperlipidemia, COPD, back surgery, R knee arthroscopic surgery, chronic back and knee pain    Limitations  Walking    Patient Stated Goals  To improve strength and stability in legs -  work around a bad back, bad shoulder and bad knees    Currently in Pain?  Yes    Pain Score  8     Pain Location  Back    Pain Orientation  Lower;Mid;Left    Pain Descriptors / Indicators  Aching    Pain Type  Chronic pain    Pain Onset  In the past 7 days    Pain Frequency  Intermittent    Aggravating Factors   increased activity    Pain Relieving Factors  rest, tylenol          [facility] Adult PT Treatment/Exercise - 05/30/18 1156      Transfers   Transfers  Sit to Stand;Stand to Sit    Sit to Stand  6: Modified independent (Device/Increase time)    Stand to Sit  6: Modified independent (Device/Increase time)      Ambulation/Gait   Ambulation/Gait  Yes    Ambulation/Gait Assistance  5: Supervision    Ambulation/Gait Assistance Details  no balance issues noted, decr gait speed.    Ambulation Distance (Feet)  500 Feet    Assistive device  Straight cane    Gait Pattern  Step-through pattern;Decreased stride length;Decreased stance time - left;Decreased weight shift to left    Ambulation Surface  Level;Unlevel;Indoor;Outdoor;Paved      Knee/Hip Exercises: Stretches   Active Hamstring Stretch  Both;2 reps;30 seconds;Limitations    Active Hamstring Stretch Limitations  seated at edge of mat: cues on correct ex form and technique.  Balance Exercises - 05/30/18 1516      Balance Exercises: Standing   Standing Eyes Closed  Narrow base of support (BOS);Wide (BOA);Head turns;Foam/compliant surface;Other reps (comment);Limitations    Partial Tandem Stance  Eyes closed;Foam/compliant surface;1 rep;30 secs;Limitations      Balance Exercises: Standing   Standing Eyes Closed Limitations  oin airex in corner with chair in front for safety: feet apart with EC no head movements, progressing to feet together for EC no head movements, progressing to feet together EC head movements left<>right, up<>down. min guard assist to min assist at times for balance with incr sway noted.                       Tandem Stance Limitations  on airex in corner with chair in front for safety: feet in modified tandem stance performing 1 rep each foot forward with EC. min assist for balance.            PT Short Term Goals -  05/20/18 1111      PT SHORT TERM GOAL #1   Title  Pt will perform initial HEP with supervision of wife  (ALL STG DUE BY 05/23/18)    Baseline  05/20/18: met today    Status  Achieved      PT SHORT TERM GOAL #2   Title  Pt will improve LE strength to perform five time sit to stand without use of UE from mat to </= 15 seconds    Baseline  05/20/18: 17.40 sec's no UE use from standard height mat table- improved just not to goal    Time  --    Period  --    Status  Partially Met      PT SHORT TERM GOAL #3   Title  Pt will demonstrate decreased falls risk with increase in BERG to >/ = 46/56    Baseline  05/20/18: scored 49/56 today    Time  --    Period  --    Status  Achieved      PT SHORT TERM GOAL #4   Title  Pt will improve gait velocity with RW to >/= 2.6 ft/sec to decrease falls risk    Baseline  05/20/18: 2.85 ft/sec with cane    Time  --    Period  --    Status  Achieved      PT SHORT TERM GOAL #5   Title  Pt will negotiate 3 stairs with rails, alternating sequence with supervision    Baseline  05/20/18: met today    Status  Achieved        PT Long Term Goals - 04/23/18 1622      PT LONG TERM GOAL #1   Title  Pt will be independent with final HEP    Time  8    Period  Weeks    Status  New    Target Date  06/22/18      PT LONG TERM GOAL #2   Title  Pt will increase LE strength to perform five time sit to stand in </= 13 seconds without UE support    Time  8    Period  Weeks    Status  New    Target Date  06/22/18      PT LONG TERM GOAL #3   Title  Pt will increase BERG to >/= 52/56 to decrease falls risk to low    Time  8  Period  Weeks    Target Date  06/22/18      PT LONG TERM GOAL #4   Title  Pt will increase gait velocity without AD to >/= 2.8 ft/sec to decrease risk for falls in community    Baseline  2.69    Time  8    Period  Weeks    Status  New    Target Date  06/22/18      PT LONG TERM GOAL #5   Title  Pt will ambulate x 500' outside over  paved surfaces and negotiate 4 stairs, alternating sequence MOD I    Time  8    Period  Weeks    Status  New    Target Date  06/22/18            Plan - 05/30/18 1152    Clinical Impression Statement  Today's skilled session continued to address gait and balance on a less intense scale due to pt's continued back/left LE pain after Monday's session. Pt with decreased activity tolerance, requesting to end session early. He continues to slowly progress toward goals with pain limiting his activity tolerance. The pt should benefit from contineud PT to progress toward unmet goals.                   Rehab Potential  Good    PT Frequency  2x / week    PT Duration  8 weeks    PT Treatment/Interventions  ADLs/Self Care Home Management;Aquatic Therapy;Electrical Stimulation;DME Instruction;Gait training;Stair training;Functional mobility training;Therapeutic activities;Therapeutic exercise;Balance training;Neuromuscular re-education;Patient/family education    PT Next Visit Plan  continue to address balance reactions, activity tolerance    PT Home Exercise Plan  [facility]     Consulted and Agree with Plan of Care  Patient    Family Member Consulted  wife       Patient will benefit from skilled therapeutic intervention in order to improve the following deficits and impairments:  Abnormal gait, Decreased activity tolerance, Decreased balance, Decreased coordination, Decreased endurance, Decreased strength, Difficulty walking, Pain  Visit Diagnosis: Hemiplegia and hemiparesis following cerebral infarction affecting left non-dominant side (HCC)  Unsteadiness on feet  Difficulty in walking, not elsewhere classified  Muscle weakness (generalized)     Problem List Patient Active Problem List   Diagnosis Date Noted  . Small vessel disease ([place]) 04/14/2018  . Diastolic dysfunction   . Stage 3 chronic kidney disease ([place])   . Aortic atherosclerosis ([place]) 04/13/2018  . Cerebral thrombosis with  cerebral infarction 04/13/2018  . Cerebrovascular accident (CVA) ([place])   . Dyslipidemia   . Essential hypertension   . Gastroesophageal reflux disease   . Weakness 04/12/2018  . Chronic cough 03/28/2018  . OSTEOARTHRITIS, BACK 09/13/2009  . ARTHRITIS, KNEES, BILATERAL 09/13/2009  . 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 ABNORMALITY OF GAIT 09/13/2009    Willow Ora, PTA, [facility] [address] [place], [place] [address] [phone] 05/30/18, 3:49 PM   Name: MISTER KRAHENBUHL MRN: [id number] Date of Birth: 1931/12/16   Physical Therapy Progress Note   Dates of Reporting Period: 04/23/18- 05/30/18   Objective Reports of Subjective Statement: Pt was making steady progress towards LTG and was beginning to progress to ambulation without cane until two sessions ago when pt reported significant increase in low back pain following balance training and gait training without cane (per patient's request).  Despite attempts to adjust activities and HEP, pt continues to have  increased back/LE pain with decreased activity tolerance.   Objective Measurements: Has met all STGs to date and is progressing slowly toward LTGs.   Goal Update: see above   Plan: Continue with POC and monitor/address low back pain with adjustments in HEP and balance activities.  If pain continues pt would like to be referred back to neurosurgeon for further work up.    10th visit PN addendum added by primary PT: Rico Junker, PT, DPT 06/02/18    4:35 PM    Treatment performed by:  Willow Ora, PTA, [facility] [address], [place], [place] [address] [phone] 05/30/18, 3:50 PM

## 2018-05-30 NOTE — Telephone Encounter (Signed)
Patient stopped in today with husband prior to therapy session with recent readings 160s over 90s (yesterday evening and this morning).  Patient does state that he has been having increased back pain for the past few days and this could be the reason for the elevation in blood pressure.  Due to continued elevation with home readings, recommend to increase amlodipine from 2.5 mg to 5 mg daily.  Patient was advised to ensure he is doing exercises at home for back pain along with use of Tylenol, heat/ice and rest.  Patient was questioning whether he should cancel PT appointment today due to back pain with elevation in blood pressure but recommended to let them know about the back pain for possible exercises to relieve the pain.  Wife was instructed to call on Monday with weekend readings for possible further increase of amlodipine dosing.

## 2018-05-30 NOTE — Addendum Note (Signed)
Addended by: Venancio Poisson on: 05/30/2018 11:32 AM   Modules accepted: Orders

## 2018-06-02 ENCOUNTER — Ambulatory Visit: Payer: Medicare Other | Admitting: Occupational Therapy

## 2018-06-02 ENCOUNTER — Ambulatory Visit: Payer: Medicare Other | Admitting: Physical Therapy

## 2018-06-02 ENCOUNTER — Encounter: Payer: Self-pay | Admitting: Physical Therapy

## 2018-06-02 ENCOUNTER — Encounter: Payer: PRIVATE HEALTH INSURANCE | Admitting: Occupational Therapy

## 2018-06-02 VITALS — BP 160/86 | HR 69

## 2018-06-02 DIAGNOSIS — R278 Other lack of coordination: Secondary | ICD-10-CM | POA: Diagnosis not present

## 2018-06-02 DIAGNOSIS — R2681 Unsteadiness on feet: Secondary | ICD-10-CM | POA: Diagnosis not present

## 2018-06-02 DIAGNOSIS — I69354 Hemiplegia and hemiparesis following cerebral infarction affecting left non-dominant side: Secondary | ICD-10-CM

## 2018-06-02 DIAGNOSIS — M6281 Muscle weakness (generalized): Secondary | ICD-10-CM | POA: Diagnosis not present

## 2018-06-02 DIAGNOSIS — R262 Difficulty in walking, not elsewhere classified: Secondary | ICD-10-CM

## 2018-06-02 MED ORDER — AMLODIPINE BESYLATE 10 MG PO TABS: 10.0000 mg | ORAL_TABLET | Freq: Every day | ORAL | 3 refills | Status: DC

## 2018-06-02 NOTE — Therapy (Addendum)
[facility] [address] [place], [place], [address] Phone: [phone]   Fax:  [phone]  Physical Therapy Treatment  Patient Details  Name: Duane Park MRN: [id number] Date of Birth: 1932-06-27 Referring Provider: Dr. Alysia Penna   Encounter Date: 06/02/2018  PT End of Session - 06/02/18 1307    Visit Number  11    Number of Visits  17    Date for PT Re-Evaluation  06/22/18    Authorization Type  Medicare and [employer] F covered 100%; VL: follow Medicare guidelines    PT Start Time  4163    PT Stop Time  1235    PT Time Calculation (min)  46 min    Activity Tolerance  Patient limited by pain    Behavior During Therapy  [facility] for tasks assessed/performed       Past Medical History:  Diagnosis Date  . COPD (chronic obstructive pulmonary disease) ([place])   . Hypercholesteremia   . Hypertension   . Stroke [facility])     Past Surgical History:  Procedure Laterality Date  . BACK SURGERY    . JOINT REPLACEMENT     arthroscopic surgery rt side    Vitals:   06/02/18 1153  BP: (!) 160/86  Pulse: 69  SpO2: 98%    Subjective Assessment - 06/02/18 1153    Subjective  Pain in low back continues to persist - 5-6/10 before taking Tylenol, 3/10 after taking Tylenol.  Is having to take two Tylenol every 8 hours.  BP is still elevated.  Denies numbness or tingling in either LE.  Denies changes in bowel or bladder habits.    Patient is accompained by:  Family member    Pertinent History  HTN, hyperlipidemia, COPD, back surgery, R knee arthroscopic surgery, chronic back and knee pain    Limitations  Walking    Patient Stated Goals  To improve strength and stability in legs -  work around a bad back, bad shoulder and bad knees    Pain Score  6     Pain Location  Back    Pain Orientation  Lower    Pain Descriptors / Indicators  Aching    Pain Type  Chronic pain    Pain Radiating Towards  down lateral LLE to  knee    Pain Onset  In the past 7 days       Provided pt with the following low back and LE stretches and strengthening exercises below.  Also attempted to perform supine lower trunk rotation, sidelying clamshell, sidelying and standing TFL stretches and seated quadratus lumborum stretch but pt unable to tolerate.  Advised pt to perform these stretches, to continue walking and to continue balance exercises that do not incorporate feet together in order to prevent further decline in ROM and strength.     Access Code: [email or web address]  URL: [email or web address]  Date: 06/02/2018  Prepared by: Misty Stanley   Exercises  Hip Flexion Stretch - 10 reps - 3 sets - 1x daily - 7x weekly  Supine Double Knee to Chest - 10 reps - 3 sets - 1x daily - 7x weekly  Supine Figure 4 Piriformis Stretch - 10 reps - 3 sets - 1x daily - 7x weekly  Seated Hamstring Stretch - 10 reps - 3 sets - 1x daily - 7x weekly  Hooklying Clamshell with Resistance - 10 reps - 3 sets - 1x daily - 7x weekly      PT  Education - 06/02/18 1306    Education Details  Provided pt with low back and hip ROM stretches and pelvic stabilization/strengthening exercises; advised to hold on balance exercises with feet together.      Person(s) Educated  Patient;Spouse    Methods  Explanation;Demonstration    Comprehension  Verbalized understanding;Returned demonstration       PT Short Term Goals - 05/20/18 1111      PT SHORT TERM GOAL #1   Title  Pt will perform initial HEP with supervision of wife  (ALL STG DUE BY 05/23/18)    Baseline  05/20/18: met today    Status  Achieved      PT SHORT TERM GOAL #2   Title  Pt will improve LE strength to perform five time sit to stand without use of UE from mat to </= 15 seconds    Baseline  05/20/18: 17.40 sec's no UE use from standard height mat table- improved just not to goal    Time  --    Period  --    Status  Partially Met      PT SHORT TERM GOAL #3   Title  Pt will demonstrate  decreased falls risk with increase in BERG to >/ = 46/56    Baseline  05/20/18: scored 49/56 today    Time  --    Period  --    Status  Achieved      PT SHORT TERM GOAL #4   Title  Pt will improve gait velocity with RW to >/= 2.6 ft/sec to decrease falls risk    Baseline  05/20/18: 2.85 ft/sec with cane    Time  --    Period  --    Status  Achieved      PT SHORT TERM GOAL #5   Title  Pt will negotiate 3 stairs with rails, alternating sequence with supervision    Baseline  05/20/18: met today    Status  Achieved        PT Long Term Goals - 04/23/18 1622      PT LONG TERM GOAL #1   Title  Pt will be independent with final HEP    Time  8    Period  Weeks    Status  New    Target Date  06/22/18      PT LONG TERM GOAL #2   Title  Pt will increase LE strength to perform five time sit to stand in </= 13 seconds without UE support    Time  8    Period  Weeks    Status  New    Target Date  06/22/18      PT LONG TERM GOAL #3   Title  Pt will increase BERG to >/= 52/56 to decrease falls risk to low    Time  8    Period  Weeks    Target Date  06/22/18      PT LONG TERM GOAL #4   Title  Pt will increase gait velocity without AD to >/= 2.8 ft/sec to decrease risk for falls in community    Baseline  2.69    Time  8    Period  Weeks    Status  New    Target Date  06/22/18      PT LONG TERM GOAL #5   Title  Pt will ambulate x 500' outside over paved surfaces and negotiate 4 stairs, alternating sequence MOD I  Time  8    Period  Weeks    Status  New    Target Date  06/22/18            Plan - 06/02/18 1305    Clinical Impression Statement  Due to ongoing low back pain treatment session focused on assessment of hip and LE ROM and prescription of lumbar and LLE stretches and strengthening in supine with back supported.  Pt did not tolerate sidelying, seated or standing stretches reporting increased discomfort in lumbar spine but did tolerate supine exercises.  Also  encouraged pt to continue with walking with cane as much as able to tolerate and to continue balance exercises that do not include feet together.  Will continue to assess at next visit; if pt pain has not improved pt has expressed a desire to return to his neurosurgeon for further evaluation due to fear that he had damaged his surgical area.  Pt did demonstrate improved BP today and slightly improved activity tolerance - pt able to participate in full treatment session today and did not have to finish early.  Will continue to assess and will refer back to physician if appropriate.      Rehab Potential  Good    PT Frequency  2x / week    PT Duration  8 weeks    PT Treatment/Interventions  ADLs/Self Care Home Management;Aquatic Therapy;Electrical Stimulation;DME Instruction;Gait training;Stair training;Functional mobility training;Therapeutic activities;Therapeutic exercise;Balance training;Neuromuscular re-education;Patient/family education    PT Next Visit Plan  assess back pain, revise HEP.  Keep going or D/C and allow him to go back to see back surgeon and then return if cleared?    PT [place]; MGQGVHLY for low back stretches    Consulted and Agree with Plan of Care  Patient    Family Member Consulted  wife       Patient will benefit from skilled therapeutic intervention in order to improve the following deficits and impairments:  Abnormal gait, Decreased activity tolerance, Decreased balance, Decreased coordination, Decreased endurance, Decreased strength, Difficulty walking, Pain  Visit Diagnosis: Hemiplegia and hemiparesis following cerebral infarction affecting left non-dominant side (HCC)  Unsteadiness on feet  Difficulty in walking, not elsewhere classified  Muscle weakness (generalized)     Problem List Patient Active Problem List   Diagnosis Date Noted  . Small vessel disease ([place]) 04/14/2018  . Diastolic dysfunction   . Stage 3 chronic kidney disease  ([place])   . Aortic atherosclerosis ([place]) 04/13/2018  . Cerebral thrombosis with cerebral infarction 04/13/2018  . Cerebrovascular accident (CVA) ([place])   . Dyslipidemia   . Essential hypertension   . Gastroesophageal reflux disease   . Weakness 04/12/2018  . Chronic cough 03/28/2018  . OSTEOARTHRITIS, BACK 09/13/2009  . ARTHRITIS, KNEES, BILATERAL 09/13/2009  . 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 ABNORMALITY OF GAIT 09/13/2009    Rico Junker, PT, DPT 06/02/18    4:38 PM    [facility] [address] [place], [place], [address] Phone: [phone]   Fax:  [phone]  Name: Duane Park MRN: [id number] Date of Birth: [birth year]

## 2018-06-02 NOTE — Addendum Note (Signed)
Addended by: Venancio Poisson on: 06/02/2018 03:06 PM   Modules accepted: Orders

## 2018-06-02 NOTE — Patient Instructions (Addendum)
Access Code: [email or web address]  URL: [email or web address]  Date: 06/02/2018  Prepared by: Misty Stanley   Exercises  Hip Flexion Stretch - 10 reps - 3 sets - 1x daily - 7x weekly  Supine Double Knee to Chest - 10 reps - 3 sets - 1x daily - 7x weekly  Supine Figure 4 Piriformis Stretch - 10 reps - 3 sets - 1x daily - 7x weekly  Seated Hamstring Stretch - 10 reps - 3 sets - 1x daily - 7x weekly  Hooklying Clamshell with Resistance - 10 reps - 3 sets - 1x daily - 7x weekly

## 2018-06-02 NOTE — Telephone Encounter (Signed)
Rn call patients wife [name] about pts blood pressure readings. Rn stated Research scientist (medical) reviewed the blood pressure readings,and will increase amlodpine to 10mg . Rx of 10mg  amlodpine was sent today to their listed pharmacy. The wife stated her husband took 5mg  this morning. She wanted to know could he take 5mg  this evening to make it 10mg  total for today. RN stated if she gave the 5mg  at 0600am this am, he can take the 5mg  at 6pm this evening. Pt can start the daily dosage of 10mg  almodpine tomorrow. The wife verbalized understanding.

## 2018-06-02 NOTE — Telephone Encounter (Signed)
Wife called with weekend BP readings: Friday-9/20:      145/68  3:45pm               150/73  8:30 pm Saturday-9/21: 142/73  7:55 am                          139/66  1:00 pm Sunday-9/22:   137/70  7:20 am                         154/71  7:45 pm Monday-9/23:  136/72   7:15 am                         16 6/75  2:00 pm Please call to discuss at [phone]

## 2018-06-02 NOTE — Telephone Encounter (Signed)
Please advise wife that we can increase dose to amlodpine 10mg  daily. New order will be placed and sent to pharmacy. Please advise to continue to check blood pressures twice a day. Thank you.

## 2018-06-02 NOTE — Telephone Encounter (Signed)
He could even take the dose now but is okay to take at 6pm tonight and then will start 10mg  total tomorrow. Thank you.

## 2018-06-04 ENCOUNTER — Encounter: Payer: PRIVATE HEALTH INSURANCE | Admitting: Occupational Therapy

## 2018-06-04 ENCOUNTER — Ambulatory Visit: Payer: Medicare Other | Admitting: Physical Therapy

## 2018-06-04 ENCOUNTER — Encounter: Payer: Self-pay | Admitting: Physical Therapy

## 2018-06-04 DIAGNOSIS — M6281 Muscle weakness (generalized): Secondary | ICD-10-CM | POA: Diagnosis not present

## 2018-06-04 DIAGNOSIS — I69354 Hemiplegia and hemiparesis following cerebral infarction affecting left non-dominant side: Secondary | ICD-10-CM

## 2018-06-04 DIAGNOSIS — R278 Other lack of coordination: Secondary | ICD-10-CM | POA: Diagnosis not present

## 2018-06-04 DIAGNOSIS — R2681 Unsteadiness on feet: Secondary | ICD-10-CM

## 2018-06-04 DIAGNOSIS — R262 Difficulty in walking, not elsewhere classified: Secondary | ICD-10-CM | POA: Diagnosis not present

## 2018-06-04 NOTE — Therapy (Signed)
[facility] [address] [place], [place], [address] Phone: [phone]   Fax:  [phone]  Physical Therapy Treatment and D/C Summary  Patient Details  Name: Duane Park MRN: [id number] Date of Birth: Jan 20, 1932 Referring Provider: Dr. Alysia Penna   Encounter Date: 06/04/2018  PT End of Session - 06/04/18 1442    Visit Number  12    Number of Visits  17    Date for PT Re-Evaluation  06/22/18    Authorization Type  Medicare and [employer] F covered 100%; VL: follow Medicare guidelines    PT Start Time  1400    PT Stop Time  1440    PT Time Calculation (min)  40 min    Activity Tolerance  Patient tolerated treatment well    Behavior During Therapy  WFL for tasks assessed/performed       Past Medical History:  Diagnosis Date  . COPD (chronic obstructive pulmonary disease) ([place])   . Hypercholesteremia   . Hypertension   . Stroke [facility])     Past Surgical History:  Procedure Laterality Date  . BACK SURGERY    . JOINT REPLACEMENT     arthroscopic surgery rt side    There were no vitals filed for this visit.  Subjective Assessment - 06/04/18 1409    Subjective  Pt is feeling better and feels he is ready for D/C.  Would like to review exercises and focus on most important ones    Patient is accompained by:  Family member    Pertinent History  HTN, hyperlipidemia, COPD, back surgery, R knee arthroscopic surgery, chronic back and knee pain    Limitations  Walking    Patient Stated Goals  To improve strength and stability in legs -  work around a bad back, bad shoulder and bad knees    Currently in Pain?  Yes    Pain Score  3     Pain Location  Back    Pain Orientation  Lower    Pain Onset  In the past 7 days         Access Code: [facility]  URL: [email or web address]  Date: 06/04/2018  Prepared by: Misty Stanley   Exercises  stagger feet with support - 10 reps - 4 sets - 1x  daily - 5x weekly  Standing Hip Abduction with Resistance at Ankles and Unilateral Counter Support - 10 reps - 2 sets - 1x daily - 5x weekly  Standing Hamstring Curl with Resistance - 10 reps - 2 sets - 1x daily - 5x weekly  Hip and Knee Flexion with Anchored Resistance - 10 reps - 3 sets - 1x daily - 7x weekly  Wide Stance with Eyes Closed on Foam Pad - 2 reps - 1x daily - 7x weekly             PT Education - 06/04/18 1441    Education Details  Final LE strength and balance HEP; D/C today    Person(s) Educated  Patient;Spouse    Methods  Explanation;Demonstration;Handout    Comprehension  Verbalized understanding;Returned demonstration       PT Short Term Goals - 05/20/18 1111      PT SHORT TERM GOAL #1   Title  Pt will perform initial HEP with supervision of wife  (ALL STG DUE BY 05/23/18)    Baseline  05/20/18: met today    Status  Achieved      PT SHORT TERM GOAL #2  Title  Pt will improve LE strength to perform five time sit to stand without use of UE from mat to </= 15 seconds    Baseline  05/20/18: 17.40 sec's no UE use from standard height mat table- improved just not to goal    Time  --    Period  --    Status  Partially Met      PT SHORT TERM GOAL #3   Title  Pt will demonstrate decreased falls risk with increase in BERG to >/ = 46/56    Baseline  05/20/18: scored 49/56 today    Time  --    Period  --    Status  Achieved      PT SHORT TERM GOAL #4   Title  Pt will improve gait velocity with RW to >/= 2.6 ft/sec to decrease falls risk    Baseline  05/20/18: 2.85 ft/sec with cane    Time  --    Period  --    Status  Achieved      PT SHORT TERM GOAL #5   Title  Pt will negotiate 3 stairs with rails, alternating sequence with supervision    Baseline  05/20/18: met today    Status  Achieved        PT Long Term Goals - 06/04/18 1449      PT LONG TERM GOAL #1   Title  Pt will be independent with final HEP    Time  8    Period  Weeks    Status  Achieved       PT LONG TERM GOAL #2   Title  Pt will increase LE strength to perform five time sit to stand in </= 13 seconds without UE support    Time  8    Period  Weeks    Status  Not Met      PT LONG TERM GOAL #3   Title  Pt will increase BERG to >/= 52/56 to decrease falls risk to low    Time  8    Period  Weeks    Status  Not Met      PT LONG TERM GOAL #4   Title  Pt will increase gait velocity without AD to >/= 2.8 ft/sec to decrease risk for falls in community    Baseline  2.8 with cane    Time  8    Period  Weeks    Status  Partially Met      PT LONG TERM GOAL #5   Title  Pt will ambulate x 500' outside over paved surfaces and negotiate 4 stairs, alternating sequence MOD I    Time  8    Period  Weeks    Status  Not Met            Plan - 06/04/18 1442    Clinical Impression Statement  Pt is reporting overall improvement in back pain and feels he is headed in the right direction and would like to finish up with therapy today.  Treatment session today focused on condensing and finalizing HEP for LE strengthening and balance; pt tolerated all exercises well with no reports of increased back pain.  Pt will continue to ambulate with cane while back pain persists and due to intermittent LLE weakness; pt will continue to work towards ambulating without cane as part of his HEP.  Pt met STG but did not complete full certification period, so unable to fully assess progress  towards LTG.  Pt pleased with current progress and is agreeable to DC from PT today.    Rehab Potential  Good    PT Frequency  2x / week    PT Duration  8 weeks    PT Treatment/Interventions  ADLs/Self Care Home Management;Aquatic Therapy;Electrical Stimulation;DME Instruction;Gait training;Stair training;Functional mobility training;Therapeutic activities;Therapeutic exercise;Balance training;Neuromuscular re-education;Patient/family education    PT Next Visit Plan  D/C    PT Home Exercise Plan  [id number]; [id number] for  low back stretches    Consulted and Agree with Plan of Care  Patient    Family Member Consulted  wife       Patient will benefit from skilled therapeutic intervention in order to improve the following deficits and impairments:  Abnormal gait, Decreased activity tolerance, Decreased balance, Decreased coordination, Decreased endurance, Decreased strength, Difficulty walking, Pain  Visit Diagnosis: Hemiplegia and hemiparesis following cerebral infarction affecting left non-dominant side (HCC)  Unsteadiness on feet  Difficulty in walking, not elsewhere classified  Muscle weakness (generalized)     Problem List Patient Active Problem List   Diagnosis Date Noted  . Small vessel disease ([place]) 04/14/2018  . Diastolic dysfunction   . Stage 3 chronic kidney disease ([place])   . Aortic atherosclerosis ([place]) 04/13/2018  . Cerebral thrombosis with cerebral infarction 04/13/2018  . Cerebrovascular accident (CVA) ([place])   . Dyslipidemia   . Essential hypertension   . Gastroesophageal reflux disease   . Weakness 04/12/2018  . Chronic cough 03/28/2018  . OSTEOARTHRITIS, BACK 09/13/2009  . ARTHRITIS, KNEES, BILATERAL 09/13/2009  . 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 ABNORMALITY OF GAIT 09/13/2009    PHYSICAL THERAPY DISCHARGE SUMMARY  Visits from Start of Care: 12  Current functional level related to goals / functional outcomes: Pt met STG but unable to assess progress towards LTG due to early D/C.  See impression statement above.   Remaining deficits: Low back pain, impaired LLE strength, impaired balance   Education / Equipment: HEP  Plan: Patient agrees to discharge.  Patient goals were partially met. Patient is being discharged due to the patient's request.  ?????     Rico Junker, PT, DPT 06/04/18    2:51 PM    [facility] [address] [place], [place], [address] Phone: [phone]   Fax:   [phone]  Name: KEL SENN MRN: [id number] Date of Birth: Nov 24, 1931

## 2018-06-04 NOTE — Patient Instructions (Addendum)
Access Code: [facility]  URL: [email or web address]  Date: 06/04/2018  Prepared by: Misty Stanley   Exercises  stagger feet with support - 10 reps - 4 sets - 1x daily - 5x weekly  Standing Hip Abduction with Resistance at Ankles and Unilateral Counter Support - 10 reps - 2 sets - 1x daily - 5x weekly  Standing Hamstring Curl with Resistance - 10 reps - 2 sets - 1x daily - 5x weekly  Hip and Knee Flexion with Anchored Resistance - 10 reps - 3 sets - 1x daily - 7x weekly  Wide Stance with Eyes Closed on Foam Pad - 2 reps - 1x daily - 7x weekly

## 2018-06-09 ENCOUNTER — Encounter: Payer: PRIVATE HEALTH INSURANCE | Admitting: Occupational Therapy

## 2018-06-09 ENCOUNTER — Ambulatory Visit (HOSPITAL_BASED_OUTPATIENT_CLINIC_OR_DEPARTMENT_OTHER): Payer: Medicare Other | Admitting: Physical Medicine & Rehabilitation

## 2018-06-09 ENCOUNTER — Encounter: Payer: Self-pay | Admitting: Physical Medicine & Rehabilitation

## 2018-06-09 ENCOUNTER — Encounter: Payer: Medicare Other | Attending: Registered Nurse

## 2018-06-09 ENCOUNTER — Ambulatory Visit: Payer: PRIVATE HEALTH INSURANCE | Admitting: Physical Therapy

## 2018-06-09 VITALS — BP 136/78 | HR 77 | Ht 71.0 in | Wt 208.0 lb

## 2018-06-09 DIAGNOSIS — I6330 Cerebral infarction due to thrombosis of unspecified cerebral artery: Secondary | ICD-10-CM | POA: Diagnosis not present

## 2018-06-09 DIAGNOSIS — I6930 Unspecified sequelae of cerebral infarction: Secondary | ICD-10-CM | POA: Insufficient documentation

## 2018-06-09 DIAGNOSIS — M25562 Pain in left knee: Secondary | ICD-10-CM | POA: Diagnosis not present

## 2018-06-09 DIAGNOSIS — M545 Low back pain, unspecified: Secondary | ICD-10-CM

## 2018-06-09 DIAGNOSIS — E785 Hyperlipidemia, unspecified: Secondary | ICD-10-CM | POA: Diagnosis not present

## 2018-06-09 DIAGNOSIS — G8929 Other chronic pain: Secondary | ICD-10-CM

## 2018-06-09 DIAGNOSIS — J449 Chronic obstructive pulmonary disease, unspecified: Secondary | ICD-10-CM | POA: Insufficient documentation

## 2018-06-09 DIAGNOSIS — Z836 Family history of other diseases of the respiratory system: Secondary | ICD-10-CM | POA: Insufficient documentation

## 2018-06-09 DIAGNOSIS — I10 Essential (primary) hypertension: Secondary | ICD-10-CM

## 2018-06-09 DIAGNOSIS — Z8249 Family history of ischemic heart disease and other diseases of the circulatory system: Secondary | ICD-10-CM | POA: Insufficient documentation

## 2018-06-09 DIAGNOSIS — E7800 Pure hypercholesterolemia, unspecified: Secondary | ICD-10-CM | POA: Insufficient documentation

## 2018-06-09 MED ORDER — TRAMADOL HCL 50 MG PO TABS: 50.0000 mg | ORAL_TABLET | Freq: Two times a day (BID) | ORAL | 0 refills | Status: AC

## 2018-06-09 NOTE — Progress Notes (Signed)
Subjective:    Patient ID: Duane Park, male    DOB: 1932-07-10, 84 y.o.   MRN: [id number] 84 year old right-handed male with history of hypertension,  hyperlipidemia, presented 04/13/2018 with left-sided weakness.  He lives with his spouse, independent prior to admission.  Cranial CT scan unremarkable for acute process.   CT angiogram of head and neck showed no emergent large vessel occlusion or stenosis.  He did not receive tPA.  MRI of the brain showed a 6 mm acute early subacute infarction within the right central cerebral peduncle.  Echocardiogram with ejection  fraction of 60%.  No wall motion abnormalities.  Grade I diastolic dysfunction.  Maintained on aspirin for CVA prophylaxis, subcutaneous Lovenox for DVT prophylaxis.   HPI CC Low back pain  84 yo with hx of Right cerebral peduncle infarct onset 04/12/18  L4-5 fusion 2011, Dr Ellene Route  Just finished outpt therapy Pt feels it started while standing with feet together on a foam cushion Has pain going down the Left hip to the knee.    Hx of Right shoulder arthroplasty  No new bowel or bladder dysfunction Pain Inventory Average Pain 4 Pain Right Now 4 My pain is intermittent, sharp and aching  In the last 24 hours, has pain interfered with the following? General activity 0 Relation with others 0 Enjoyment of life 0 What TIME of day is your pain at its worst? . Sleep (in general) Fair  Pain is worse with: walking and standing Pain improves with: heat/ice Relief from Meds: 6  Mobility use a cane ability to climb steps?  yes  Function retired  Neuro/Psych No problems in this area  Prior Studies Any changes since last visit?  no CLINICAL DATA:  84 y/o  M; left-sided weakness and slurred speech.  EXAM: MRI HEAD WITHOUT CONTRAST  TECHNIQUE: Multiplanar, multiecho pulse sequences of the brain and surrounding structures were obtained without intravenous contrast.  COMPARISON:  04/12/2018 CT head, CTA head,  CT perfusion head  FINDINGS: Brain: 6 mm focus of reduced diffusion within the right central cerebral peduncle compatible with acute/early subacute infarction. No associated hemorrhage or mass effect. Tiny chronic lacunar infarct within the right putamen. Mild volume loss of the brain for age. No extra-axial collection, hydrocephalus, focal mass effect of the brain, or herniation.  Vascular: Normal flow voids.  Skull and upper cervical spine: Normal marrow signal.  Sinuses/Orbits: Ethmoid sinus mucosal thickening and small mucous retention cysts within the maxillary sinuses. No abnormal signal of the mastoid air cells. Bilateral intra-ocular lens replacement.  Other: None.  IMPRESSION: 1. 6 mm acute/early subacute infarction within the right central cerebral peduncle. No associated hemorrhage or mass effect. 2. Mild volume loss of the brain for age. 3. Mild ethmoid and maxillary sinus disease Physicians involved in your care Any changes since last visit?  no   Family History  Problem Relation Age of Onset  . Lung disease Mother        never smoker  . Heart disease Father    Social History   Socioeconomic History  . Marital status: Married    Spouse name: Not on file  . Number of children: Not on file  . Years of education: Not on file  . Highest education level: Not on file  Occupational History  . Not on file  Social Needs  . Financial resource strain: Not on file  . Food insecurity:    Worry: Not on file    Inability: Not on file  .  Transportation needs:    Medical: Not on file    Non-medical: Not on file  Tobacco Use  . Smoking status: Never Smoker  . Smokeless tobacco: Never Used  Substance and Sexual Activity  . Alcohol use: Not Currently    Frequency: Never  . Drug use: Never  . Sexual activity: Not on file  Lifestyle  . Physical activity:    Days per week: Not on file    Minutes per session: Not on file  . Stress: Not on file    Relationships  . Social connections:    Talks on phone: Not on file    Gets together: Not on file    Attends religious service: Not on file    Active member of club or organization: Not on file    Attends meetings of clubs or organizations: Not on file    Relationship status: Not on file  Other Topics Concern  . Not on file  Social History Narrative  . Not on file   Past Surgical History:  Procedure Laterality Date  . BACK SURGERY    . JOINT REPLACEMENT     arthroscopic surgery rt side   Past Medical History:  Diagnosis Date  . COPD (chronic obstructive pulmonary disease) ([place])   . Hypercholesteremia   . Hypertension   . Stroke ([place])    BP 136/78   Pulse 77   Ht 5\' 11"  (1.803 m)   Wt 208 lb (94.3 kg)   SpO2 97%   BMI 29.01 kg/m   Opioid Risk Score:   Fall Risk Score:  `1  Depression screen PHQ 2/9  Depression screen PHQ 2/9 05/05/2018  Decreased Interest 0  Down, Depressed, Hopeless 0  PHQ - 2 Score 0  Altered sleeping 0  Tired, decreased energy 3  Change in appetite 0  Feeling bad or failure about yourself  0  Trouble concentrating 0  Moving slowly or fidgety/restless 0  Suicidal thoughts 0  PHQ-9 Score 3  Difficult doing work/chores Not difficult at all     Review of Systems  Constitutional: Negative.   HENT: Negative.   Eyes: Negative.   Respiratory: Negative.   Cardiovascular: Negative.   Gastrointestinal: Negative.   Endocrine: Negative.   Genitourinary: Negative.   Musculoskeletal: Positive for arthralgias, back pain and myalgias.  Skin: Negative.   Allergic/Immunologic: Negative.   Neurological: Negative.   Hematological: Negative.   Psychiatric/Behavioral: Negative.   All other systems reviewed and are negative.      Objective:   Physical Exam  Constitutional: He is oriented to person, place, and time. He appears well-developed and well-nourished. No distress.  HENT:  Head: Normocephalic and atraumatic.  Eyes: Pupils are equal,  round, and reactive to light. EOM are normal.  Musculoskeletal: Normal range of motion.  Patient has no tenderness to palpation over the lumbar paraspinals or over the sacral area.  He has reduced lumbar range of motion with flexion extension lateral bending and rotation Negative straight leg raise testing  Neurological: He is alert and oriented to person, place, and time.  Skin: Skin is warm and dry. He is not diaphoretic.  Nursing note and vitals reviewed. Motor strength is 5/5 in the right deltoid by stress of grip 4/5 in left deltoid bicep tricep grip 5/5 bilateral hip flexor knee extensor ankle dorsiflexor. Gait without evidence of toe drag or knee instability.        Assessment & Plan:  1.  History of right cerebral peduncle infarct with excellent  recovery now at a modified independent level.  He has had a flareup of his low back pain which she states occurred during his neurological rehabilitation.  He did not have any falls or trauma I do not think any imaging studies needed at the current time. No new neurologic findings.  Patient's mobility remains unimpeded by pain no bowel bladder  dysfunction We will start some tramadol 50 mg twice daily supplemented by Tylenol 500 mg 4 times daily as needed for 1 week. Patient has had good relief with some type of lumbar injection in the past I suspect there was an epidural performed by Dr. Ellene Route however would need some further records to check injected.  If he does not improve with conservative care may benefit from repeat injection. I will see the patient back in 1 month regardless how he is progressing with his home exercise program

## 2018-06-09 NOTE — Patient Instructions (Signed)
Take tramadol 50mg  twice a day for 1 week  May take tylenol 1 tablet up to 4 times a day as needed with the tramadol

## 2018-06-10 NOTE — Telephone Encounter (Signed)
Patient's wife calling regarding patient's BP readings. The high is 145/67 and low 136/78.

## 2018-06-10 NOTE — Telephone Encounter (Signed)
Please advise to continue current dose at this time as blood pressure range acceptable at this time and we do not want to drop blood pressures too quickly. Also remind them to ensure they are finding a PCP for future management.

## 2018-06-10 NOTE — Telephone Encounter (Signed)
RN call patients wife about her husband blood pressure reading. Rn stated Janett Billow NP stated she is fine with the acceptable range because she does not want to drop it too quickly. RN also stated Janett Billow NP stated in her note if they are looking or have they found a new PCP. The wife stated they have not found a new PCP. Rn ask if they have been looking or have some names narrow down.The wife stated to Rn she does not have any potential candidates. RN stress the importance that Janett Billow NP does not manage or titrate blood pressure medications. Rn stated she prescribed the one blood pressure to help them out until they find a new PCP to manage it. RN recommend to wife she go online and look into some PCP MD in the [place] area and narrow them around. Also call around and find one in the next two months to get establish. The wife verbalized understanding.

## 2018-06-11 DIAGNOSIS — Z23 Encounter for immunization: Secondary | ICD-10-CM | POA: Diagnosis not present

## 2018-06-12 ENCOUNTER — Encounter: Payer: PRIVATE HEALTH INSURANCE | Admitting: Occupational Therapy

## 2018-06-12 ENCOUNTER — Ambulatory Visit: Payer: PRIVATE HEALTH INSURANCE | Admitting: Physical Therapy

## 2018-06-26 DIAGNOSIS — M419 Scoliosis, unspecified: Secondary | ICD-10-CM | POA: Diagnosis not present

## 2018-07-04 DIAGNOSIS — H532 Diplopia: Secondary | ICD-10-CM | POA: Diagnosis not present

## 2018-07-07 ENCOUNTER — Ambulatory Visit (HOSPITAL_BASED_OUTPATIENT_CLINIC_OR_DEPARTMENT_OTHER): Payer: Medicare Other | Admitting: Physical Medicine & Rehabilitation

## 2018-07-07 ENCOUNTER — Encounter: Payer: Medicare Other | Attending: Registered Nurse

## 2018-07-07 ENCOUNTER — Encounter: Payer: Self-pay | Admitting: Physical Medicine & Rehabilitation

## 2018-07-07 VITALS — BP 147/83 | HR 72 | Resp 14 | Ht 71.0 in | Wt 208.0 lb

## 2018-07-07 DIAGNOSIS — J449 Chronic obstructive pulmonary disease, unspecified: Secondary | ICD-10-CM | POA: Diagnosis not present

## 2018-07-07 DIAGNOSIS — E7800 Pure hypercholesterolemia, unspecified: Secondary | ICD-10-CM | POA: Diagnosis not present

## 2018-07-07 DIAGNOSIS — E785 Hyperlipidemia, unspecified: Secondary | ICD-10-CM | POA: Insufficient documentation

## 2018-07-07 DIAGNOSIS — I6330 Cerebral infarction due to thrombosis of unspecified cerebral artery: Secondary | ICD-10-CM

## 2018-07-07 DIAGNOSIS — Z8249 Family history of ischemic heart disease and other diseases of the circulatory system: Secondary | ICD-10-CM | POA: Diagnosis not present

## 2018-07-07 DIAGNOSIS — M25562 Pain in left knee: Secondary | ICD-10-CM | POA: Insufficient documentation

## 2018-07-07 DIAGNOSIS — M545 Low back pain: Secondary | ICD-10-CM | POA: Diagnosis not present

## 2018-07-07 DIAGNOSIS — Z836 Family history of other diseases of the respiratory system: Secondary | ICD-10-CM | POA: Diagnosis not present

## 2018-07-07 DIAGNOSIS — I10 Essential (primary) hypertension: Secondary | ICD-10-CM | POA: Diagnosis not present

## 2018-07-07 DIAGNOSIS — I6930 Unspecified sequelae of cerebral infarction: Secondary | ICD-10-CM | POA: Insufficient documentation

## 2018-07-07 NOTE — Patient Instructions (Signed)

## 2018-07-07 NOTE — Progress Notes (Signed)
Subjective:    Patient ID: Duane Park, male    DOB: Mar 31, 1932, 84 y.o.   MRN: [id number] 84 year old right-handed male with history of hypertension,  hyperlipidemia, presented 04/13/2018 with left-sided weakness.  He lives with his spouse, independent prior to admission.  Cranial CT scan unremarkable for acute process.   CT angiogram of head and neck showed no emergent large vessel occlusion or stenosis.  He did not receive tPA.  MRI of the brain showed a 6 mm acute early subacute infarction within the right central cerebral peduncle.  Echocardiogram with ejection  fraction of 60%.  No wall motion abnormalities.  Grade I diastolic dysfunction.  Maintained on aspirin for CVA prophylaxis, subcutaneous Lovenox for DVT prophylaxis.    HPI Dresing and bathing ModI Finished outpt PT and OT No recent falls Has driven recently short distance  No vision problems, saw optho on Friday, next visit is 1 yr  The patient has complained of low back pain, prior history of low back issues and has seen neurosurgeon Dr. Kristeen Miss in the past Back pain is flared up over the last month or 2.  Tramadol helpful, prescribed initially at our last visit for 7-day supply and then his primary doctor gave him a couple weeks supply. Seen by Ellene Route scheduled for injection  Pain Inventory Average Pain 5 Pain Right Now 5 My pain is constant and sharp  In the last 24 hours, has pain interfered with the following? General activity 0 Relation with others 0 Enjoyment of life 0 What TIME of day is your pain at its worst? morning Sleep (in general) Fair  Pain is worse with: walking, sitting, standing and some activites Pain improves with: medication and injections Relief from Meds: 4  Mobility walk with assistance use a cane  Function retired  Neuro/Psych weakness trouble walking  Prior Studies Any changes since last visit?  no  Physicians involved in your care Any changes since last visit?   yes   Family History  Problem Relation Age of Onset  . Lung disease Mother        never smoker  . Heart disease Father    Social History   Socioeconomic History  . Marital status: Married    Spouse name: Not on file  . Number of children: Not on file  . Years of education: Not on file  . Highest education level: Not on file  Occupational History  . Not on file  Social Needs  . Financial resource strain: Not on file  . Food insecurity:    Worry: Not on file    Inability: Not on file  . Transportation needs:    Medical: Not on file    Non-medical: Not on file  Tobacco Use  . Smoking status: Never Smoker  . Smokeless tobacco: Never Used  Substance and Sexual Activity  . Alcohol use: Not Currently    Frequency: Never  . Drug use: Never  . Sexual activity: Not on file  Lifestyle  . Physical activity:    Days per week: Not on file    Minutes per session: Not on file  . Stress: Not on file  Relationships  . Social connections:    Talks on phone: Not on file    Gets together: Not on file    Attends religious service: Not on file    Active member of club or organization: Not on file    Attends meetings of clubs or organizations: Not on file  Relationship status: Not on file  Other Topics Concern  . Not on file  Social History Narrative  . Not on file   Past Surgical History:  Procedure Laterality Date  . BACK SURGERY    . JOINT REPLACEMENT     arthroscopic surgery rt side   Past Medical History:  Diagnosis Date  . COPD (chronic obstructive pulmonary disease) ([place])   . Hypercholesteremia   . Hypertension   . Stroke ([place])    BP (!) 147/83 (BP Location: Left Arm, Patient Position: Sitting, Cuff Size: Normal)   Pulse 72   Resp 14   Ht 5\' 11"  (1.803 m)   Wt 208 lb (94.3 kg)   SpO2 94%   BMI 29.01 kg/m   Opioid Risk Score:   Fall Risk Score:  `1  Depression screen PHQ 2/9  Depression screen PHQ 2/9 05/05/2018  Decreased Interest 0  Down, Depressed,  Hopeless 0  PHQ - 2 Score 0  Altered sleeping 0  Tired, decreased energy 3  Change in appetite 0  Feeling bad or failure about yourself  0  Trouble concentrating 0  Moving slowly or fidgety/restless 0  Suicidal thoughts 0  PHQ-9 Score 3  Difficult doing work/chores Not difficult at all    Review of Systems  Constitutional: Negative.   HENT: Negative.   Eyes: Negative.   Respiratory: Negative.   Cardiovascular: Negative.   Gastrointestinal: Negative.   Endocrine: Negative.   Genitourinary: Negative.   Musculoskeletal: Positive for gait problem.  Skin: Negative.   Allergic/Immunologic: Negative.   Neurological: Positive for weakness.  Hematological: Negative.   Psychiatric/Behavioral: Negative.   All other systems reviewed and are negative.      Objective:   Physical Exam  Constitutional: He is oriented to person, place, and time. He appears well-developed and well-nourished. No distress.  HENT:  Head: Normocephalic and atraumatic.  Eyes: Pupils are equal, round, and reactive to light. EOM are normal.  Neck: Normal range of motion.  Musculoskeletal: Normal range of motion.  Neurological: He is alert and oriented to person, place, and time.  Skin: Skin is warm and dry. He is not diaphoretic.  Psychiatric: He has a normal mood and affect.  Nursing note and vitals reviewed.  Mild tenderness palpation lumbosacral junction.  He has decreased lumbar spine range of motion with flexion extension lateral bending and rotation Negative straight leg raising Motor strength is 5/5 bilateral deltoid bicep tricep grip hip flexor knee extensor ankle flexor       Assessment & Plan:  1.  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  He still needs to use his cane.  Otherwise he is achieved modified independent status no further inpatient or outpatient rehabilitation needed.  2.  Acute exacerbation of chronic low back pain, this may be related to gait  deviation from his stroke, he will follow-up with Dr. Ellene Route from neurosurgery.  In terms of his pain medication we discussed that tramadol would likely be the most appropriate given his comorbidities.  Should not be using nonsteroidal anti-inflammatories.  He will follow-up with his primary care physician on this. Physical medicine rehab follow-up on as-needed basis

## 2018-07-11 DIAGNOSIS — M5416 Radiculopathy, lumbar region: Secondary | ICD-10-CM | POA: Diagnosis not present

## 2018-07-11 DIAGNOSIS — M5116 Intervertebral disc disorders with radiculopathy, lumbar region: Secondary | ICD-10-CM | POA: Diagnosis not present

## 2018-08-12 DIAGNOSIS — J0100 Acute maxillary sinusitis, unspecified: Secondary | ICD-10-CM | POA: Diagnosis not present

## 2018-08-25 ENCOUNTER — Ambulatory Visit (INDEPENDENT_AMBULATORY_CARE_PROVIDER_SITE_OTHER): Payer: Medicare Other | Admitting: Adult Health

## 2018-08-25 ENCOUNTER — Encounter: Payer: Self-pay | Admitting: Adult Health

## 2018-08-25 VITALS — BP 147/65 | HR 68 | Ht 71.0 in | Wt 209.2 lb

## 2018-08-25 DIAGNOSIS — I10 Essential (primary) hypertension: Secondary | ICD-10-CM | POA: Diagnosis not present

## 2018-08-25 DIAGNOSIS — I63511 Cerebral infarction due to unspecified occlusion or stenosis of right middle cerebral artery: Secondary | ICD-10-CM

## 2018-08-25 DIAGNOSIS — E785 Hyperlipidemia, unspecified: Secondary | ICD-10-CM

## 2018-08-25 NOTE — Patient Instructions (Signed)
Continue aspirin 81 mg daily  and lipitor  for secondary stroke prevention  Continue to follow up with PCP regarding cholesterol and blood pressure management   Continue to stay active and maintain a healthy diet  Continue to monitor blood pressure at home  Maintain strict control of hypertension with blood pressure goal below 130/90, diabetes with hemoglobin A1c goal below 6.5% and cholesterol with LDL cholesterol (bad cholesterol) goal below 70 mg/dL. I also advised the patient to eat a healthy diet with plenty of whole grains, cereals, fruits and vegetables, exercise regularly and maintain ideal body weight.  Followup in the future with me in 6 months or call earlier if needed       Thank you for coming to see [place] at [facility]. I hope we have been able to provide you high quality care today.  You may receive a patient satisfaction survey over the next few weeks. We would appreciate your feedback and comments so that we may continue to improve ourselves and the health of our patients.

## 2018-08-25 NOTE — Progress Notes (Signed)
[facility] [address] [place]. [place] [address] [phone]       OFFICE FOLLOW UP NOTE  [name] Date of Birth:  [birth year] Medical Record Number:  [id number]   Reason for Referral:  hospital stroke follow up  CHIEF COMPLAINT:  Chief Complaint  Patient presents with  . 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 History obtained from patient, wife and chart review. Reviewed all radiology images and labs personally.  Duane Frix Shoresis a 84 y.o.malepast medical history of hypertension, hyperlipidemia, COPD, who was in his usual state of health when he went to bed at 11:30 PM on 04/11/2018 and woke up the next morning with left facial droop, left arm and leg weakness and slurred speech.  Patient was initially brought to [facility] and then was transferred to [facility].  Unable to receive TPA due to late presentation.  CT head negative for acute abnormality.  MRI brain reviewed and showed 6 mm acute/early subacute infarction within the right central cerebral peduncle.  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  2D echo showed a normal ejection fraction without evidence of cardiac thrombus.  LDL 126 and his patient was not on statin PTA recommended Lipitor 80 mg daily.  HTN stable during admission and recommended long-term BP goal normotensive range.  A1c satisfactory at 5.3.  Patient was not on antithrombotic PTA and recommended aspirin 325 mg daily.  Patient was discharged to [facility] for continuation of therapies.   05/22/2018 visit: Patient is being seen today for hospital follow-up and is accompanied by his wife.  He has returned home from inpatient rehab and continues to do physical therapy at neuro rehab as he has graduated from occupational therapy.  He has recovered  well with only mild residual left upper extremity weakness.  He continues to take aspirin 81 mg with mild bruising but no bleeding.  Continues to take Lipitor without side effects of myalgias.  Blood pressure today elevated at 150/77.  This is monitored at home and wife states typically ranges from 1 40-1 60.  Patient is not currently on antihypertensive at this time.  Patient has returned back to most prior activities except for mowing the yard.  He did attempt driving 1 mile yesterday around his neighborhood ended while doing this.  Denies new or worsening stroke/TIA symptoms.  Interval history 08/25/2018: Patient is being seen today for 62-month follow-up visit and is accompanied by his wife.  He states overall he is been doing well with only mild left upper extremity weakness. He has completed therapies with doing some of the exercises at home.  He continues on aspirin without side effects of bleeding or bruising.  Continues on atorvastatin without side effects myalgias.  Blood pressure today 147/65.  After prior visit, he was started on amlodipine 2.5 mg daily which was slowly increased up to amlodipine 10 mg daily for better blood pressure control management. Per wife his blood pressure has been ranging from 138-147.  He did receive back injection 07/2018 by neuro surgeon with some benefit.  No further concerns at this time.  Denies new or worsening stroke/TIA symptoms.   ROS:   14 system review of systems performed and negative with exception of double vision  PMH:  Past Medical History:  Diagnosis Date  . COPD (chronic obstructive pulmonary disease) ([place])   . Hypercholesteremia   . Hypertension   . Stroke [facility])     PSH:  Past Surgical History:  Procedure Laterality Date  . BACK SURGERY    . JOINT REPLACEMENT     arthroscopic surgery rt side    Social History:  Social History   Socioeconomic History  . Marital status: Married    Spouse name: Not on file  . Number of children: Not  on file  . Years of education: Not on file  . Highest education level: Not on file  Occupational History  . Not on file  Social Needs  . Financial resource strain: Not on file  . Food insecurity:    Worry: Not on file    Inability: Not on file  . Transportation needs:    Medical: Not on file    Non-medical: Not on file  Tobacco Use  . Smoking status: Never Smoker  . Smokeless tobacco: Never Used  Substance and Sexual Activity  . Alcohol use: Not Currently    Frequency: Never  . Drug use: Never  . Sexual activity: Not on file  Lifestyle  . Physical activity:    Days per week: Not on file    Minutes per session: Not on file  . Stress: Not on file  Relationships  . Social connections:    Talks on phone: Not on file    Gets together: Not on file    Attends religious service: Not on file    Active member of club or organization: Not on file    Attends meetings of clubs or organizations: Not on file    Relationship status: Not on file  . Intimate partner violence:    Fear of current or ex partner: Not on file    Emotionally abused: Not on file    Physically abused: Not on file    Forced sexual activity: Not on file  Other Topics Concern  . Not on file  Social History Narrative  . Not on file    Family History:  Family History  Problem Relation Age of Onset  . Lung disease Mother        never smoker  . Heart disease Father     Medications:   Current Outpatient Medications on File Prior to Visit  Medication Sig Dispense Refill  . acetaminophen (TYLENOL) 325 MG tablet Take 1-2 tablets (325-650 mg total) by mouth every 4 (four) hours as needed for mild pain.    Marland Kitchen amLODipine (NORVASC) 10 MG tablet Take 1 tablet (10 mg total) by mouth daily. 30 tablet 3  . aspirin EC 81 MG EC tablet Take 1 tablet (81 mg total) by mouth daily. 30 tablet 3  . atorvastatin (LIPITOR) 80 MG tablet Take 1 tablet (80 mg total) by mouth daily at 6 PM. 30 tablet 3  . Multiple Vitamin  (MULTIVITAMIN) tablet Take 1 tablet by mouth daily.    . pantoprazole (PROTONIX) 40 MG tablet Take 2 tablets (80 mg total) by mouth daily. 90 tablet 3  . psyllium (METAMUCIL) 58.6 % powder Take 1 packet by mouth daily.    . traMADol (ULTRAM) 50 MG tablet Take 50 mg by mouth 2 (two) times daily.  0   No current facility-administered medications on file prior to visit.     Allergies:  No Known Allergies   Physical Exam  Vitals:   08/25/18 1343  BP: (!) 147/65  Pulse: 68  Weight: 209 lb 3.2 oz (94.9 kg)  Height: 5\' 11"  (1.803 m)   Body mass index is 29.18 kg/m. No exam data present  General: well developed, well nourished, pleasant elderly Caucasian male, seated, in no evident distress Head: head normocephalic and atraumatic.   Neck: supple with no carotid or supraclavicular bruits Cardiovascular: regular rate and rhythm, no murmurs Musculoskeletal: no deformity Skin:  no rash/petichiae Vascular:  Normal pulses all extremities  Neurologic Exam Mental Status: Awake and fully alert. Oriented to place and time. Recent and remote memory intact. Attention span, concentration and fund of knowledge appropriate. Mood and affect appropriate.  Cranial Nerves: Fundoscopic exam reveals sharp disc margins. Pupils equal, briskly reactive to light. Extraocular movements full without nystagmus. Visual fields full to confrontation. Hearing intact. Facial sensation intact. Face, tongue, palate moves normally and symmetrically.  Motor: Normal bulk and tone. Normal strength in all tested extremity muscles. Sensory.: intact to touch , pinprick , position and vibratory sensation.  Coordination: Rapid alternating movements normal in all extremities. Finger-to-nose and heel-to-shin performed accurately bilaterally.  Decreased left hand dexterity.  Orbits right arm over left arm. Gait and Station: Arises from chair without difficulty. Stance is normal. Gait demonstrates normal stride length and balance .  Able to heel, toe and tandem walk without difficulty.  Reflexes: 1+ and symmetric. Toes downgoing.      Diagnostic Data (Labs, Imaging, Testing)  Ct Angio Head W Or Wo Contrast Ct Angio Neck W Or Wo Contrast Ct Cerebral Perfusion W Contrast 04/12/2018 IMPRESSION:  1. No emergent large vessel occlusion.  2. No significant proximal intracranial arterial stenosis.  3. Mild cervical carotid artery atherosclerosis without stenosis.  4. Mild proximal right vertebral artery stenosis. Hypoplastic left vertebral artery.    Mr Brain Wo Contrast 04/13/2018 IMPRESSION:  1. 6 mm acute/early subacute infarction within the right central cerebral peduncle. No associated hemorrhage or mass effect.  2. Mild volume loss of the brain for age.  3. Mild ethmoid and maxillary sinus disease.    Ct Head Code Stroke Wo Contrast 04/12/2018 IMPRESSION:  1. Unremarkable CT appearance of the brain for age. 2. ASPECTS is 10.   ECHOCARDIOGRAM 04/13/2018 Study Conclusions - Left ventricle: The cavity size was normal. Wall thickness was   increased in a pattern of mild LVH. Systolic function was normal.   The estimated ejection fraction was in the range of 55% to 60%.   Wall motion was normal; there were no regional wall motion   abnormalities. Doppler parameters are consistent with abnormal   left ventricular relaxation (grade 1 diastolic dysfunction).   ASSESSMENT: Duane Park is a 84 y.o. year old male here with right central cerebral peduncle infarct on 04/12/2018 secondary to small vessel disease. Vascular risk factors include HTN and HLD.  Patient is being seen today for 60-month follow-up visit and overall is recovering well with only mild residual left upper extremity weakness.   PLAN: -Continue aspirin 81 mg daily  and Lipitor 80 mg for secondary stroke prevention -F/u with PCP regarding your HLD and HTN management -continue to monitor BP at home -advised to continue to stay active and maintain  a healthy diet -Maintain strict control of hypertension with blood pressure goal below 130/90, diabetes with hemoglobin A1c goal below 6.5% and cholesterol with LDL cholesterol (bad cholesterol) goal below 70 mg/dL. I also advised the patient to eat a healthy diet with plenty of whole grains, cereals, fruits and vegetables, exercise regularly and maintain ideal body weight.  Follow up in 6 months or call earlier if needed   Greater than 50% of time during this 25 minute visit was spent on counseling,explanation of diagnosis of right central cerebral peduncle infarct, reviewing risk factor management of HTN and HLD, planning of further management, discussion with patient and family and coordination of care    Duane Park, AGNP-BC  [facility] [address] [place], [place] [address]  Phone [phone] Fax [phone] Note: This document was prepared with digital dictation and possible smart phrase technology. Any transcriptional errors that result from this process are unintentional.

## 2018-08-26 NOTE — Progress Notes (Signed)
I agree with the above plan 

## 2018-08-27 DIAGNOSIS — L821 Other seborrheic keratosis: Secondary | ICD-10-CM | POA: Diagnosis not present

## 2018-08-27 DIAGNOSIS — L300 Nummular dermatitis: Secondary | ICD-10-CM | POA: Diagnosis not present

## 2018-08-27 DIAGNOSIS — L570 Actinic keratosis: Secondary | ICD-10-CM | POA: Diagnosis not present

## 2018-08-27 DIAGNOSIS — D044 Carcinoma in situ of skin of scalp and neck: Secondary | ICD-10-CM | POA: Diagnosis not present

## 2018-08-27 DIAGNOSIS — L578 Other skin changes due to chronic exposure to nonionizing radiation: Secondary | ICD-10-CM | POA: Diagnosis not present

## 2018-09-04 DIAGNOSIS — R600 Localized edema: Secondary | ICD-10-CM | POA: Diagnosis not present

## 2018-10-08 DIAGNOSIS — H60311 Diffuse otitis externa, right ear: Secondary | ICD-10-CM | POA: Diagnosis not present

## 2018-10-14 DIAGNOSIS — I10 Essential (primary) hypertension: Secondary | ICD-10-CM | POA: Diagnosis not present

## 2018-10-14 DIAGNOSIS — Z6826 Body mass index (BMI) 26.0-26.9, adult: Secondary | ICD-10-CM | POA: Diagnosis not present

## 2018-10-14 DIAGNOSIS — E782 Mixed hyperlipidemia: Secondary | ICD-10-CM | POA: Diagnosis not present

## 2018-10-14 DIAGNOSIS — K219 Gastro-esophageal reflux disease without esophagitis: Secondary | ICD-10-CM | POA: Diagnosis not present

## 2018-10-14 DIAGNOSIS — Z6831 Body mass index (BMI) 31.0-31.9, adult: Secondary | ICD-10-CM | POA: Diagnosis not present

## 2018-10-14 DIAGNOSIS — E669 Obesity, unspecified: Secondary | ICD-10-CM | POA: Diagnosis not present

## 2018-10-14 DIAGNOSIS — E7800 Pure hypercholesterolemia, unspecified: Secondary | ICD-10-CM | POA: Diagnosis not present

## 2018-10-28 IMAGING — MR MR HEAD W/O CM
10 of 11 series · 42 of 48 positions shown · non-contrast
Comparison: 04/12/2018 CT head, CTA head, CT perfusion head

CLINICAL DATA: 85 y/o  M; left-sided weakness and slurred speech.

EXAM:
MRI HEAD WITHOUT CONTRAST
TECHNIQUE: Multiplanar, multiecho pulse sequences of the brain and surrounding
structures were obtained without intravenous contrast.

[Series 5: ax dwi_tracew · axial · 3.0mm · 1.50mm/px · z∈[-82,+56]mm · 9 of 94 slices shown]
[im 1/94]
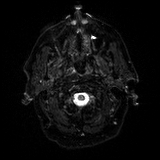
[im 12/94]
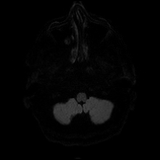
[im 24/94]
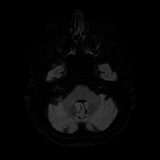
[im 35/94]
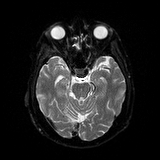
[im 47/94]
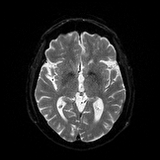
[im 59/94]
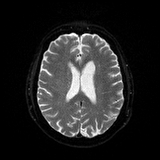
[im 70/94]
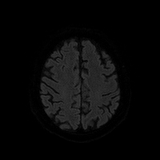
[im 82/94]
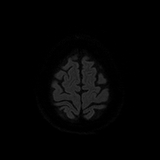
[im 94/94]
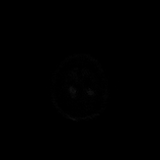

[Series 6: ax dwi_adc · axial · 3.0mm · 1.50mm/px · z∈[-82,+56]mm · 4 of 47 slices shown]
[im 1/47]
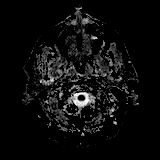
[im 16/47]
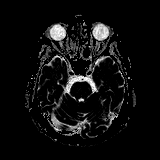
[im 31/47]
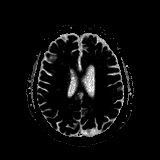
[im 47/47]
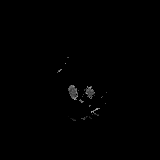

[Series 7: cor dwi_tracew · coronal · 5.0mm · 1.44mm/px · 6 of 64 slices shown]
[im 1/64]
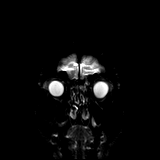
[im 13/64]
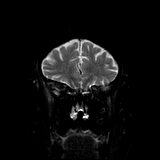
[im 26/64]
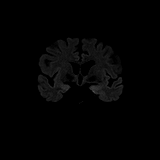
[im 38/64]
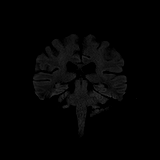
[im 51/64]
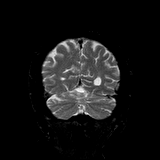
[im 64/64]
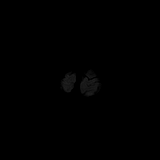

[Series 8: cor dwi_adc · coronal · 5.0mm · 1.44mm/px · 3 of 32 slices shown]
[im 1/32]
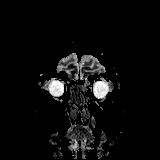
[im 16/32]
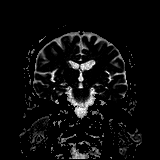
[im 32/32]
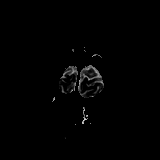

[Series 9: T1 · sagittal · 5.0mm · 0.75mm/px · 2 of 23 slices shown]
[im 1/23]
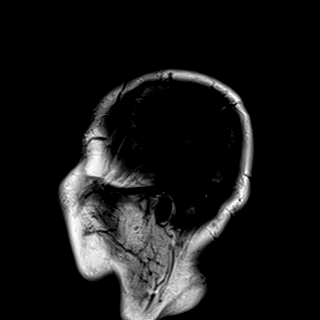
[im 23/23]
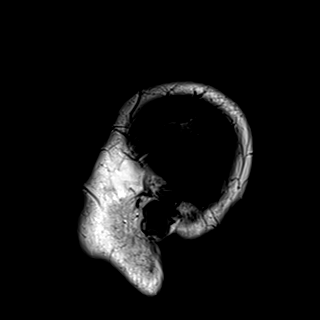

[Series 10: T2 · axial · 5.0mm · 0.72mm/px · z∈[-84,+60]mm · 2 of 25 slices shown (1 of 2)]
[im 1/25]
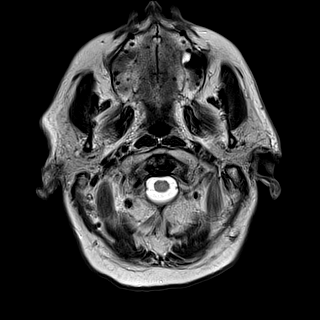
[im 25/25]
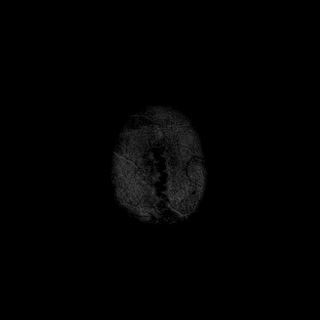

[Series 11: FLAIR · axial · 5.0mm · 0.45mm/px · z∈[-82,+61]mm · 2 of 25 slices shown]
[im 1/25]
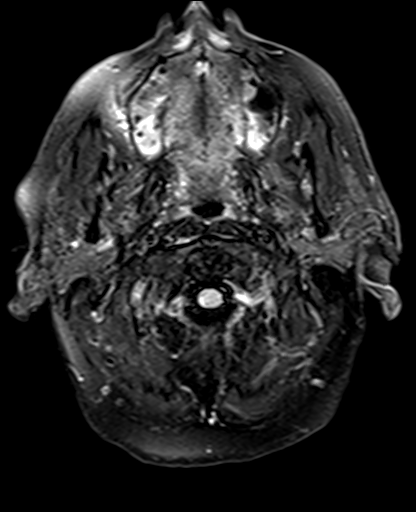
[im 25/25]
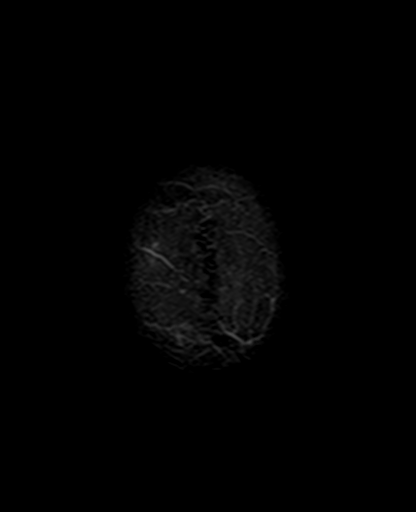

[Series 12: swi_images · axial · 3.0mm · 0.90mm/px · z∈[-99,+78]mm · 6 of 60 slices shown]
[im 1/60]
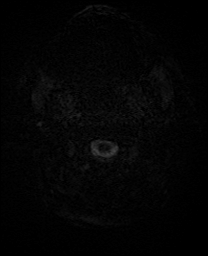
[im 12/60]
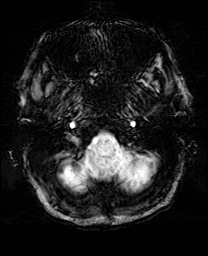
[im 24/60]
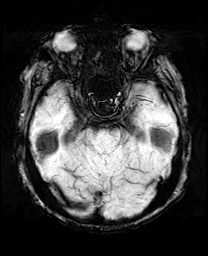
[im 36/60]
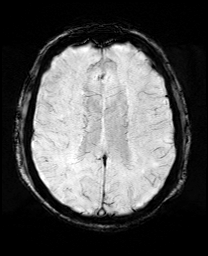
[im 48/60]
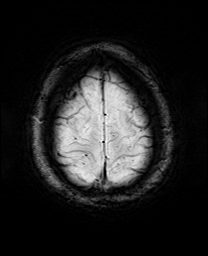
[im 60/60]
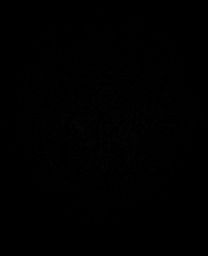

[Series 13: mip_images(sw) · axial · 24.0mm · 0.90mm/px · z∈[-88,+67]mm · 5 of 53 slices shown]
[im 1/53]
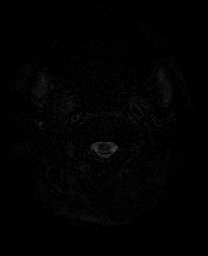
[im 14/53]
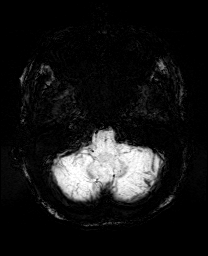
[im 27/53]
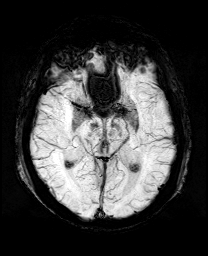
[im 40/53]
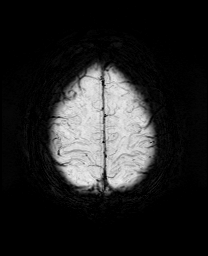
[im 53/53]
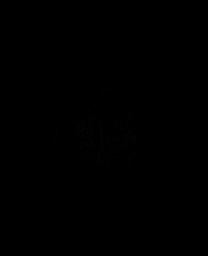

[Series 15: T2 · coronal · 5.0mm · 0.34mm/px · 3 of 29 slices shown (2 of 2)]
[im 1/29]
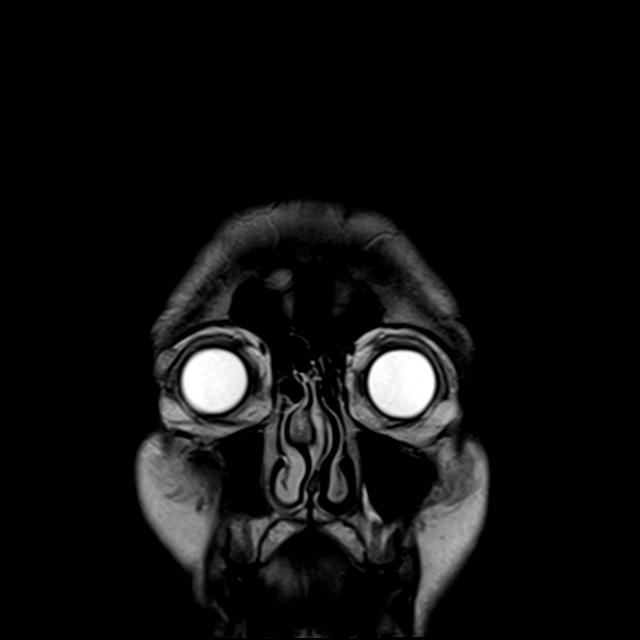
[im 15/29]
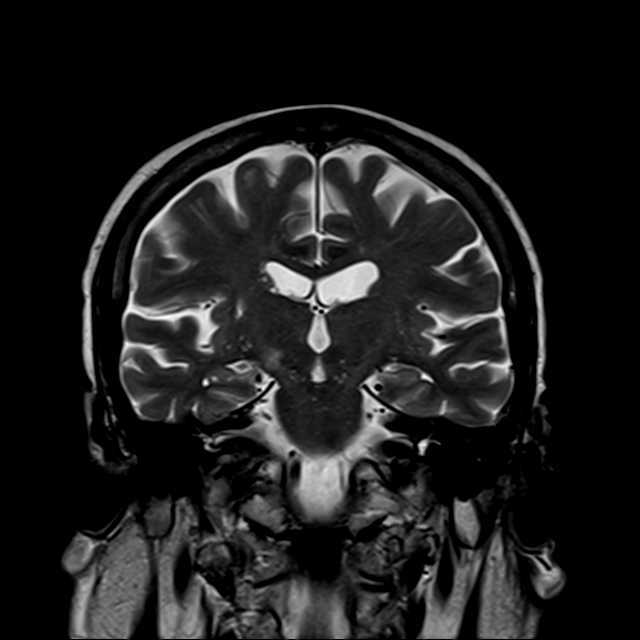
[im 29/29]
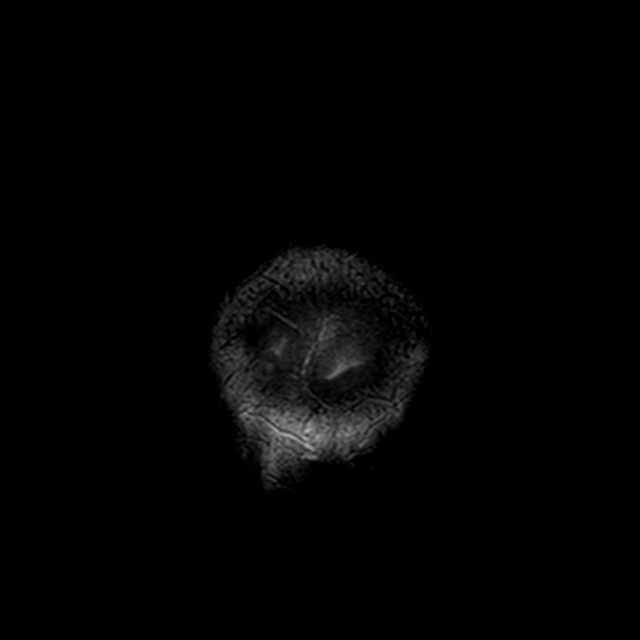

[42 of 48 positions shown; findings below may reference images not displayed]

FINDINGS: Brain: 6 mm focus of reduced diffusion within the right central
cerebral peduncle compatible with acute/early subacute infarction.
No associated hemorrhage or mass effect. Tiny chronic lacunar
infarct within the right putamen. Mild volume loss of the brain for
age. No extra-axial collection, hydrocephalus, focal mass effect of
the brain, or herniation.

Vascular: Normal flow voids.

Skull and upper cervical spine: Normal marrow signal.

Sinuses/Orbits: Ethmoid sinus mucosal thickening and small mucous
retention cysts within the maxillary sinuses. No abnormal signal of
the mastoid air cells. Bilateral intra-ocular lens replacement.

Other: None.
IMPRESSION: 1. 6 mm acute/early subacute infarction within the right central
cerebral peduncle. No associated hemorrhage or mass effect.
2. Mild volume loss of the brain for age.
3. Mild ethmoid and maxillary sinus disease.

These results will be called to the ordering clinician or
representative by the Radiologist Assistant, and communication
documented in the PACS or zVision Dashboard.

By: Eric Kwesi Sodjah M.D.

## 2018-10-31 DIAGNOSIS — H532 Diplopia: Secondary | ICD-10-CM | POA: Diagnosis not present

## 2018-10-31 DIAGNOSIS — H524 Presbyopia: Secondary | ICD-10-CM | POA: Diagnosis not present

## 2018-11-10 DIAGNOSIS — H524 Presbyopia: Secondary | ICD-10-CM | POA: Diagnosis not present

## 2018-11-12 DIAGNOSIS — I10 Essential (primary) hypertension: Secondary | ICD-10-CM | POA: Diagnosis not present

## 2018-11-12 DIAGNOSIS — K219 Gastro-esophageal reflux disease without esophagitis: Secondary | ICD-10-CM | POA: Diagnosis not present

## 2018-11-12 DIAGNOSIS — E782 Mixed hyperlipidemia: Secondary | ICD-10-CM | POA: Diagnosis not present

## 2018-11-24 DIAGNOSIS — D044 Carcinoma in situ of skin of scalp and neck: Secondary | ICD-10-CM | POA: Diagnosis not present

## 2018-11-24 DIAGNOSIS — L570 Actinic keratosis: Secondary | ICD-10-CM | POA: Diagnosis not present

## 2019-02-03 DIAGNOSIS — J22 Unspecified acute lower respiratory infection: Secondary | ICD-10-CM | POA: Diagnosis not present

## 2019-02-11 DIAGNOSIS — C61 Malignant neoplasm of prostate: Secondary | ICD-10-CM | POA: Diagnosis not present

## 2019-02-18 DIAGNOSIS — Z8546 Personal history of malignant neoplasm of prostate: Secondary | ICD-10-CM | POA: Diagnosis not present

## 2019-02-18 DIAGNOSIS — R351 Nocturia: Secondary | ICD-10-CM | POA: Diagnosis not present

## 2019-02-18 DIAGNOSIS — N200 Calculus of kidney: Secondary | ICD-10-CM | POA: Diagnosis not present

## 2019-02-19 ENCOUNTER — Telehealth: Payer: Self-pay

## 2019-02-19 NOTE — Telephone Encounter (Signed)
Please advise him of potential risk of office visit at this time due to current pandemic with moderate COVID-19 risk score and if verbalized understanding and wish to proceed with office visit, no need to reschedule

## 2019-02-19 NOTE — Telephone Encounter (Signed)
I called pts wife back that provider stated pt can come in for in office visit. I stated that due to current pandemic with him being moderate COVID 19 risk they understand the risk of having the office visit. The wife ask would she be allowed to come to visit. I stated its recommend that it be only the patient and provider. The pts husband told wife sometimes he does hear everything and would like to be present during the visit. The wife stated they have been in the house and dont really go anywhere. I stated provider will be aware of wife wants to be present.

## 2019-02-19 NOTE — Telephone Encounter (Signed)
I called pt but he was not at home about appt being change to video visit. I stated its due to the [place] 19 pandemic.The wife stated pt has a flip phone with no camera on it.Also her phone does not have camera on it.They dont have Internet connection.I stated provider will review the chart and will call back if r/s is necessary. The wife verbalized understanding.

## 2019-02-23 DIAGNOSIS — M5416 Radiculopathy, lumbar region: Secondary | ICD-10-CM | POA: Diagnosis not present

## 2019-02-23 DIAGNOSIS — M5116 Intervertebral disc disorders with radiculopathy, lumbar region: Secondary | ICD-10-CM | POA: Diagnosis not present

## 2019-02-23 NOTE — Telephone Encounter (Signed)
If patient needs assistance from his wife to get into exam room or has cognitive deficit she will be able to be present during visit.  Unfortunately, if this is not the case, she will be unable to be in exam room per current code health policy.   I am more than happy to speak with wife after visit and updating with ongoing plan or answering any questions.

## 2019-02-24 ENCOUNTER — Other Ambulatory Visit: Payer: Self-pay

## 2019-02-24 ENCOUNTER — Ambulatory Visit (INDEPENDENT_AMBULATORY_CARE_PROVIDER_SITE_OTHER): Payer: Medicare Other | Admitting: Adult Health

## 2019-02-24 ENCOUNTER — Encounter: Payer: Self-pay | Admitting: Adult Health

## 2019-02-24 VITALS — BP 130/78 | Temp 96.2°F | Wt 207.0 lb

## 2019-02-24 DIAGNOSIS — I63511 Cerebral infarction due to unspecified occlusion or stenosis of right middle cerebral artery: Secondary | ICD-10-CM | POA: Diagnosis not present

## 2019-02-24 DIAGNOSIS — I10 Essential (primary) hypertension: Secondary | ICD-10-CM

## 2019-02-24 DIAGNOSIS — E785 Hyperlipidemia, unspecified: Secondary | ICD-10-CM | POA: Diagnosis not present

## 2019-02-24 NOTE — Progress Notes (Signed)
[facility] [address] [place]. [place] [address] [phone]       OFFICE FOLLOW UP NOTE  Mr. SCHYLAR WUEBKER Date of Birth:  Aug 05, 1932 Medical Record Number:  [id number]   Reason for Referral:  hospital stroke follow up  CHIEF COMPLAINT:  Chief Complaint  Patient presents with  . 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 History obtained from patient, wife and chart review. Reviewed all radiology images and labs personally.  Caige Almeda Shoresis a 85 y.o.malepast medical history of hypertension, hyperlipidemia, COPD, who was in his usual state of health when he went to bed at 11:30 PM on 04/11/2018 and woke up the next morning with left facial droop, left arm and leg weakness and slurred speech.  Patient was initially brought to [facility] and then was transferred to [facility].  Unable to receive TPA due to late presentation.  CT head negative for acute abnormality.  MRI brain reviewed and showed 6 mm acute/early subacute infarction within the right central cerebral peduncle.  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  2D echo showed a normal ejection fraction without evidence of cardiac thrombus.  LDL 126 and his patient was not on statin PTA recommended Lipitor 80 mg daily.  HTN stable during admission and recommended long-term BP goal normotensive range.  A1c satisfactory at 5.3.  Patient was not on antithrombotic PTA and recommended aspirin 325 mg daily.  Patient was discharged to [facility] for continuation of therapies.   05/22/2018 visit: Patient is being seen today for hospital follow-up and is accompanied by his wife.  He has returned home from inpatient rehab and continues to do physical therapy at neuro rehab as he has graduated from occupational therapy.  He has  recovered well with only mild residual left upper extremity weakness.  He continues to take aspirin 81 mg with mild bruising but no bleeding.  Continues to take Lipitor without side effects of myalgias.  Blood pressure today elevated at 150/77.  This is monitored at home and wife states typically ranges from 1 40-1 60.  Patient is not currently on antihypertensive at this time.  Patient has returned back to most prior activities except for mowing the yard.  He did attempt driving 1 mile yesterday around his neighborhood ended while doing this.  Denies new or worsening stroke/TIA symptoms.  08/25/2018 visit: Patient is being seen today for 10-month follow-up visit and is accompanied by his wife.  He states overall he is been doing well with only mild left upper extremity weakness. He has completed therapies with doing some of the exercises at home.  He continues on aspirin without side effects of bleeding or bruising.  Continues on atorvastatin without side effects myalgias.  Blood pressure today 147/65.  After prior visit, he was started on amlodipine 2.5 mg daily which was slowly increased up to amlodipine 10 mg daily for better blood pressure control management. Per wife his blood pressure has been ranging from 138-147.  He did receive back injection 07/2018 by neuro surgeon with some benefit.  No further concerns at this time.  Denies new or worsening stroke/TIA symptoms.  02/24/2019 visit: Mr. Salway is a 85 year old male who is being seen today for follow-up regarding stroke in 04/2018 and  accompanied by his wife.  He has been stable from a stroke standpoint with residual mild left upper extremity weakness but this does not interfere with daytime activity.  Continues on aspirin and atorvastatin without reported side effects.  Blood pressure today 130/78.  He did receive steroid injection yesterday by neurosurgery which did elevate his BP last night and this morning but typically ranging 1 10-1 30.  They  continue to follow with PCP for ongoing management.  No further concerns at this time.  Denies new or worsening stroke/TIA symptoms.     ROS:   14 system review of systems performed and negative with exception of mild weakness  PMH:  Past Medical History:  Diagnosis Date  . COPD (chronic obstructive pulmonary disease) ([place])   . Hypercholesteremia   . Hypertension   . Stroke [facility])     PSH:  Past Surgical History:  Procedure Laterality Date  . BACK SURGERY    . JOINT REPLACEMENT     arthroscopic surgery rt side    Social History:  Social History   Socioeconomic History  . Marital status: Married    Spouse name: Not on file  . Number of children: Not on file  . Years of education: Not on file  . Highest education level: Not on file  Occupational History  . Not on file  Social Needs  . Financial resource strain: Not on file  . Food insecurity    Worry: Not on file    Inability: Not on file  . Transportation needs    Medical: Not on file    Non-medical: Not on file  Tobacco Use  . Smoking status: Never Smoker  . Smokeless tobacco: Never Used  Substance and Sexual Activity  . Alcohol use: Not Currently    Frequency: Never  . Drug use: Never  . Sexual activity: Not on file  Lifestyle  . Physical activity    Days per week: Not on file    Minutes per session: Not on file  . Stress: Not on file  Relationships  . Social Herbalist on phone: Not on file    Gets together: Not on file    Attends religious service: Not on file    Active member of club or organization: Not on file    Attends meetings of clubs or organizations: Not on file    Relationship status: Not on file  . Intimate partner violence    Fear of current or ex partner: Not on file    Emotionally abused: Not on file    Physically abused: Not on file    Forced sexual activity: Not on file  Other Topics Concern  . Not on file  Social History Narrative  . Not on file    Family  History:  Family History  Problem Relation Age of Onset  . Lung disease Mother        never smoker  . Heart disease Father     Medications:   Current Outpatient Medications on File Prior to Visit  Medication Sig Dispense Refill  . acetaminophen (TYLENOL) 325 MG tablet Take 1-2 tablets (325-650 mg total) by mouth every 4 (four) hours as needed for mild pain.    Marland Kitchen aspirin EC 81 MG EC tablet Take 1 tablet (81 mg total) by mouth daily. 30 tablet 3  . atorvastatin (LIPITOR) 80 MG tablet Take 1 tablet (80 mg total) by mouth daily at 6 PM. 30 tablet 3  . Cetirizine  HCl (ZYRTEC PO) Take 10 mg by mouth.    . Multiple Vitamin (MULTIVITAMIN) tablet Take 1 tablet by mouth daily.    Marland Kitchen olmesartan-hydrochlorothiazide (BENICAR HCT) 40-12.5 MG tablet Take 1 tablet by mouth daily.    . pantoprazole (PROTONIX) 40 MG tablet Take 2 tablets (80 mg total) by mouth daily. 90 tablet 3  . psyllium (METAMUCIL) 58.6 % powder Take 1 packet by mouth daily.     No current facility-administered medications on file prior to visit.     Allergies:  No Known Allergies   Physical Exam  Vitals:   02/24/19 1020  BP: 130/78  Temp: (!) 96.2 F (35.7 C)  Weight: 207 lb (93.9 kg)   Body mass index is 28.87 kg/m. No exam data present  General: well developed, well nourished, pleasant elderly Caucasian male, seated, in no evident distress Head: head normocephalic and atraumatic.   Neck: supple with no carotid or supraclavicular bruits Cardiovascular: regular rate and rhythm, no murmurs Musculoskeletal: no deformity Skin:  no rash/petichiae Vascular:  Normal pulses all extremities  Neurologic Exam Mental Status: Awake and fully alert. Oriented to place and time. Recent and remote memory intact. Attention span, concentration and fund of knowledge appropriate. Mood and affect appropriate.  Cranial Nerves: Pupils equal, briskly reactive to light. Extraocular movements full without nystagmus. Visual fields full to  confrontation. Hearing intact. Facial sensation intact. Face, tongue, palate moves normally and symmetrically.  Motor: Normal bulk and tone. Normal strength in all tested extremity muscles. Sensory.: intact to touch , pinprick , position and vibratory sensation.  Coordination: Rapid alternating movements normal in all extremities. Finger-to-nose and heel-to-shin performed accurately bilaterally.  Decreased left hand dexterity.  Orbits right arm over left arm. Gait and Station: Arises from chair without difficulty. Stance is normal. Gait demonstrates normal stride length and balance .  Reflexes: 1+ and symmetric. Toes downgoing.      Diagnostic Data (Labs, Imaging, Testing)  Ct Angio Head W Or Wo Contrast Ct Angio Neck W Or Wo Contrast Ct Cerebral Perfusion W Contrast 04/12/2018 IMPRESSION:  1. No emergent large vessel occlusion.  2. No significant proximal intracranial arterial stenosis.  3. Mild cervical carotid artery atherosclerosis without stenosis.  4. Mild proximal right vertebral artery stenosis. Hypoplastic left vertebral artery.    Mr Brain Wo Contrast 04/13/2018 IMPRESSION:  1. 6 mm acute/early subacute infarction within the right central cerebral peduncle. No associated hemorrhage or mass effect.  2. Mild volume loss of the brain for age.  3. Mild ethmoid and maxillary sinus disease.    Ct Head Code Stroke Wo Contrast 04/12/2018 IMPRESSION:  1. Unremarkable CT appearance of the brain for age. 2. ASPECTS is 10.   ECHOCARDIOGRAM 04/13/2018 Study Conclusions - Left ventricle: The cavity size was normal. Wall thickness was   increased in a pattern of mild LVH. Systolic function was normal.   The estimated ejection fraction was in the range of 55% to 60%.   Wall motion was normal; there were no regional wall motion   abnormalities. Doppler parameters are consistent with abnormal   left ventricular relaxation (grade 1 diastolic dysfunction).   ASSESSMENT: TENNESSEE PERRA is a 85 y.o. year old male here with right central cerebral peduncle infarct on 04/12/2018 secondary to small vessel disease. Vascular risk factors include HTN and HLD.  He has been seen today for follow-up visit and has been stable from a stroke standpoint with residual mild left upper extremity weakness which does not interfere with daily  activity   PLAN: -Continue aspirin 81 mg daily  and Lipitor 80 mg for secondary stroke prevention -F/u with PCP regarding your HLD and HTN management -continue to monitor BP at home -advised to continue to stay active and maintain a healthy diet -Maintain strict control of hypertension with blood pressure goal below 130/90, diabetes with hemoglobin A1c goal below 6.5% and cholesterol with LDL cholesterol (bad cholesterol) goal below 70 mg/dL. I also advised the patient to eat a healthy diet with plenty of whole grains, cereals, fruits and vegetables, exercise regularly and maintain ideal body weight.  Follow-up as needed as stable from stroke standpoint   Greater than 50% of time during this 25 minute visit was spent on counseling,explanation of diagnosis of right central cerebral peduncle infarct, reviewing risk factor management of HTN and HLD, planning of further management, discussion with patient and family and coordination of care    Venancio Poisson, AGNP-BC  [facility] [address] [place], [place] [address]  Phone [phone] Fax [phone] Note: This document was prepared with digital dictation and possible smart phrase technology. Any transcriptional errors that result from this process are unintentional.

## 2019-02-24 NOTE — Patient Instructions (Signed)
Continue aspirin 81 mg daily  and lipitor  for secondary stroke prevention  Continue to follow up with PCP regarding cholesterol and blood pressure management   Continue to follow up with your neurosurgeon regarding ongoing back pain management as well as decrease sensation in both of your legs.  Decrease sensation can be from your ongoing chronic back condition but we may need to consider doing additional testing in the future if indicated  Continue to stay active and maintain a healthy diet  Continue to monitor blood pressure at home  Maintain strict control of hypertension with blood pressure goal below 130/90, diabetes with hemoglobin A1c goal below 6.5% and cholesterol with LDL cholesterol (bad cholesterol) goal below 70 mg/dL. I also advised the patient to eat a healthy diet with plenty of whole grains, cereals, fruits and vegetables, exercise regularly and maintain ideal body weight.         Thank you for coming to see [place] at [facility]. I hope we have been able to provide you high quality care today.  You may receive a patient satisfaction survey over the next few weeks. We would appreciate your feedback and comments so that we may continue to improve ourselves and the health of our patients.

## 2019-02-25 ENCOUNTER — Encounter: Payer: Self-pay | Admitting: Adult Health

## 2019-02-25 NOTE — Progress Notes (Signed)
I agree with the above plan 

## 2019-05-07 DIAGNOSIS — E782 Mixed hyperlipidemia: Secondary | ICD-10-CM | POA: Diagnosis not present

## 2019-05-07 DIAGNOSIS — K219 Gastro-esophageal reflux disease without esophagitis: Secondary | ICD-10-CM | POA: Diagnosis not present

## 2019-05-07 DIAGNOSIS — Z79899 Other long term (current) drug therapy: Secondary | ICD-10-CM | POA: Diagnosis not present

## 2019-05-07 DIAGNOSIS — I10 Essential (primary) hypertension: Secondary | ICD-10-CM | POA: Diagnosis not present

## 2019-06-01 DIAGNOSIS — I8310 Varicose veins of unspecified lower extremity with inflammation: Secondary | ICD-10-CM | POA: Diagnosis not present

## 2019-06-01 DIAGNOSIS — L578 Other skin changes due to chronic exposure to nonionizing radiation: Secondary | ICD-10-CM | POA: Diagnosis not present

## 2019-06-01 DIAGNOSIS — L820 Inflamed seborrheic keratosis: Secondary | ICD-10-CM | POA: Diagnosis not present

## 2019-06-01 DIAGNOSIS — L570 Actinic keratosis: Secondary | ICD-10-CM | POA: Diagnosis not present

## 2019-06-01 DIAGNOSIS — R600 Localized edema: Secondary | ICD-10-CM | POA: Diagnosis not present

## 2019-06-22 DIAGNOSIS — M5116 Intervertebral disc disorders with radiculopathy, lumbar region: Secondary | ICD-10-CM | POA: Diagnosis not present

## 2019-06-22 DIAGNOSIS — M5416 Radiculopathy, lumbar region: Secondary | ICD-10-CM | POA: Diagnosis not present

## 2019-06-29 DIAGNOSIS — M5416 Radiculopathy, lumbar region: Secondary | ICD-10-CM | POA: Diagnosis not present

## 2019-07-01 DIAGNOSIS — M5416 Radiculopathy, lumbar region: Secondary | ICD-10-CM | POA: Diagnosis not present

## 2019-07-07 DIAGNOSIS — M5416 Radiculopathy, lumbar region: Secondary | ICD-10-CM | POA: Diagnosis not present

## 2019-07-07 DIAGNOSIS — M5136 Other intervertebral disc degeneration, lumbar region: Secondary | ICD-10-CM | POA: Diagnosis not present

## 2019-07-07 DIAGNOSIS — M48061 Spinal stenosis, lumbar region without neurogenic claudication: Secondary | ICD-10-CM | POA: Diagnosis not present

## 2019-07-08 DIAGNOSIS — M48062 Spinal stenosis, lumbar region with neurogenic claudication: Secondary | ICD-10-CM | POA: Diagnosis not present

## 2019-07-10 DIAGNOSIS — Z23 Encounter for immunization: Secondary | ICD-10-CM | POA: Diagnosis not present

## 2019-11-19 DIAGNOSIS — E782 Mixed hyperlipidemia: Secondary | ICD-10-CM | POA: Diagnosis not present

## 2019-11-19 DIAGNOSIS — M5116 Intervertebral disc disorders with radiculopathy, lumbar region: Secondary | ICD-10-CM | POA: Diagnosis not present

## 2019-11-19 DIAGNOSIS — K219 Gastro-esophageal reflux disease without esophagitis: Secondary | ICD-10-CM | POA: Diagnosis not present

## 2019-11-19 DIAGNOSIS — I10 Essential (primary) hypertension: Secondary | ICD-10-CM | POA: Diagnosis not present

## 2019-11-30 DIAGNOSIS — L570 Actinic keratosis: Secondary | ICD-10-CM | POA: Diagnosis not present

## 2019-12-12 ENCOUNTER — Other Ambulatory Visit: Payer: Self-pay

## 2019-12-12 ENCOUNTER — Encounter (HOSPITAL_COMMUNITY): Payer: Self-pay | Admitting: Emergency Medicine

## 2019-12-12 ENCOUNTER — Emergency Department (HOSPITAL_COMMUNITY)
Admission: EM | Admit: 2019-12-12 | Discharge: 2019-12-12 | Disposition: A | Discharge status: Home / Self Care | Payer: Medicare Other | Attending: Emergency Medicine | Admitting: Emergency Medicine

## 2019-12-12 ENCOUNTER — Emergency Department (HOSPITAL_COMMUNITY): Payer: Medicare Other

## 2019-12-12 DIAGNOSIS — J449 Chronic obstructive pulmonary disease, unspecified: Secondary | ICD-10-CM | POA: Diagnosis not present

## 2019-12-12 DIAGNOSIS — J9811 Atelectasis: Secondary | ICD-10-CM | POA: Diagnosis not present

## 2019-12-12 DIAGNOSIS — N183 Chronic kidney disease, stage 3 unspecified: Secondary | ICD-10-CM | POA: Diagnosis not present

## 2019-12-12 DIAGNOSIS — R531 Weakness: Secondary | ICD-10-CM

## 2019-12-12 DIAGNOSIS — Z9660 Presence of unspecified orthopedic joint implant: Secondary | ICD-10-CM | POA: Insufficient documentation

## 2019-12-12 DIAGNOSIS — I129 Hypertensive chronic kidney disease with stage 1 through stage 4 chronic kidney disease, or unspecified chronic kidney disease: Secondary | ICD-10-CM | POA: Diagnosis not present

## 2019-12-12 DIAGNOSIS — I10 Essential (primary) hypertension: Secondary | ICD-10-CM | POA: Diagnosis not present

## 2019-12-12 DIAGNOSIS — R410 Disorientation, unspecified: Secondary | ICD-10-CM | POA: Diagnosis not present

## 2019-12-12 DIAGNOSIS — S0990XA Unspecified injury of head, initial encounter: Secondary | ICD-10-CM | POA: Diagnosis not present

## 2019-12-12 DIAGNOSIS — Z79899 Other long term (current) drug therapy: Secondary | ICD-10-CM | POA: Diagnosis not present

## 2019-12-12 LAB — COMPREHENSIVE METABOLIC PANEL
ALT: 18 U/L (ref 0–44)
AST: 27 U/L (ref 15–41)
Albumin: 3.2 g/dL — ABNORMAL LOW (ref 3.5–5.0)
Alkaline Phosphatase: 90 U/L (ref 38–126)
Anion gap: 10 (ref 5–15)
BUN: 23 mg/dL (ref 8–23)
CO2: 26 mmol/L (ref 22–32)
Calcium: 8.9 mg/dL (ref 8.9–10.3)
Chloride: 101 mmol/L (ref 98–111)
Creatinine, Ser: 1.03 mg/dL (ref 0.61–1.24)
GFR calc Af Amer: >60 mL/min (ref >60)
GFR calc non Af Amer: >60 mL/min (ref >60)
Glucose, Bld: 91 mg/dL (ref 70–99)
Potassium: 4 mmol/L (ref 3.5–5.1)
Sodium: 137 mmol/L (ref 135–145)
Total Bilirubin: 1.3 mg/dL — ABNORMAL HIGH (ref 0.3–1.2)
Total Protein: 5.9 g/dL — ABNORMAL LOW (ref 6.5–8.1)

## 2019-12-12 LAB — CBC
HCT: 45 % (ref 39.0–52.0)
Hemoglobin: 15.5 g/dL (ref 13.0–17.0)
MCH: 37 pg — ABNORMAL HIGH (ref 26.0–34.0)
MCHC: 34.4 g/dL (ref 30.0–36.0)
MCV: 107.4 fL — ABNORMAL HIGH (ref 80.0–100.0)
Platelets: 206 10*3/uL (ref 150–400)
RBC: 4.19 MIL/uL — ABNORMAL LOW (ref 4.22–5.81)
RDW: 11.8 % (ref 11.5–15.5)
WBC: 8 10*3/uL (ref 4.0–10.5)
nRBC: 0 % (ref 0.0–0.2)

## 2019-12-12 LAB — URINALYSIS, ROUTINE W REFLEX MICROSCOPIC
Bilirubin Urine: NEGATIVE
Glucose, UA: NEGATIVE mg/dL
Hgb urine dipstick: NEGATIVE
Ketones, ur: NEGATIVE mg/dL
Leukocytes,Ua: NEGATIVE
Nitrite: NEGATIVE
Protein, ur: NEGATIVE mg/dL
Specific Gravity, Urine: 1.014 (ref 1.005–1.030)
pH: 7 (ref 5.0–8.0)

## 2019-12-12 LAB — CBG MONITORING, ED: Glucose-Capillary: 88 mg/dL (ref 70–99)

## 2019-12-12 MED ORDER — HYPROMELLOSE (GONIOSCOPIC) 2.5 % OP SOLN
1.0000 [drp] | OPHTHALMIC | Status: DC | PRN
  Filled 2019-12-12: qty 15

## 2019-12-12 NOTE — Discharge Instructions (Addendum)
Please stop tramadol Follow-up with Dr. Ellene Route or his primary care doctor next week Return to the emergency department if you are worse at any time especially worsening weakness, fever, or pain

## 2019-12-12 NOTE — ED Provider Notes (Signed)
[facility] EMERGENCY DEPARTMENT Provider Note   CSN: [id number] Arrival date & time: 12/12/19  [id number]     History Chief Complaint  Patient presents with  . Weakness    [place]ane Park is a 86 y.o. male.  HPI      86 year old male presents today with reports of weakness.  History is obtained from his wife.  She states that he was "not acting normally today".  She said that he woke up this morning sat on the edge of the bed and told her "I am not feeling right".  He normally walks with a walker and was able to get up and get ready.  She states he then felt like he was unsteady and did not get up.  She states that he went to bed with last known normal last evening around 10 PM.  He was sleeping at 6 AM when she checked on him.  He has had tramadol started on Thursday.  Past Medical History:  Diagnosis Date  . COPD (chronic obstructive pulmonary disease) ([place])   . Hypercholesteremia   . Hypertension   . Stroke [facility])     Patient Active Problem List   Diagnosis Date Noted  . Small vessel disease ([place]) 04/14/2018  . Diastolic dysfunction   . Stage 3 chronic kidney disease   . Aortic atherosclerosis ([place]) 04/13/2018  . Cerebral thrombosis with cerebral infarction 04/13/2018  . Cerebrovascular accident (CVA) ([place])   . Dyslipidemia   . Essential hypertension   . Gastroesophageal reflux disease   . Weakness 04/12/2018  . Chronic cough 03/28/2018  . OSTEOARTHRITIS, BACK 09/13/2009  . ARTHRITIS, KNEES, BILATERAL 09/13/2009  . 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 ABNORMALITY OF GAIT 09/13/2009    Past Surgical History:  Procedure Laterality Date  . BACK SURGERY    . JOINT REPLACEMENT     arthroscopic surgery rt side       Family History  Problem Relation Age of Onset  . Lung disease Mother        never smoker  . Heart disease Father     Social History   Tobacco Use  . Smoking status: Never Smoker  . Smokeless tobacco: Never Used   Substance Use Topics  . Alcohol use: Not Currently  . Drug use: Never    Home Medications Prior to Admission medications   Medication Sig Start Date End Date Taking? Authorizing Provider  acetaminophen (TYLENOL) 325 MG tablet Take 1-2 tablets (325-650 mg total) by mouth every 4 (four) hours as needed for mild pain. 04/22/18   Angiulli, Lavon Paganini, PA-C  aspirin EC 81 MG EC tablet Take 1 tablet (81 mg total) by mouth daily. 04/15/18   Ledell Noss, MD  atorvastatin (LIPITOR) 80 MG tablet Take 1 tablet (80 mg total) by mouth daily at 6 PM. 04/22/18   Angiulli, Lavon Paganini, PA-C  Cetirizine HCl (ZYRTEC PO) Take 10 mg by mouth.    [provider]  Multiple Vitamin (MULTIVITAMIN) tablet Take 1 tablet by mouth daily.    [provider]  olmesartan-hydrochlorothiazide (BENICAR HCT) 40-12.5 MG tablet Take 1 tablet by mouth daily.    [provider]  pantoprazole (PROTONIX) 40 MG tablet Take 2 tablets (80 mg total) by mouth daily. 04/22/18   Angiulli, Lavon Paganini, PA-C  psyllium (METAMUCIL) 58.6 % powder Take 1 packet by mouth daily.    [provider]    Allergies    Patient has no known allergies.  Review of Systems   Review of Systems  All other systems reviewed and are negative.   Physical Exam Updated Vital Signs BP 134/69   Pulse 81   Temp 98.3 F (36.8 C) (Rectal)   Resp 11   Ht 1.803 m (5\' 11" )   Wt 95.3 kg   SpO2 96%   BMI 29.29 kg/m   Physical Exam Vitals reviewed.  Constitutional:      General: He is not in acute distress.    Appearance: He is not ill-appearing.  HENT:     Head: Normocephalic.     Right Ear: External ear normal.     Nose: Nose normal.     Mouth/Throat:     Mouth: Mucous membranes are moist.  Eyes:     Pupils: Pupils are equal, round, and reactive to light.  Cardiovascular:     Rate and Rhythm: Normal rate and regular rhythm.     Pulses: Normal pulses.  Pulmonary:     Effort: Pulmonary effort is normal.     Breath  sounds: Normal breath sounds.  Abdominal:     General: Abdomen is flat.     Palpations: Abdomen is soft.  Musculoskeletal:        General: Tenderness present. No swelling. Normal range of motion.     Cervical back: Normal range of motion.  Skin:    General: Skin is warm and dry.     Capillary Refill: Capillary refill takes less than 2 seconds.  Neurological:     General: No focal deficit present.     Mental Status: He is alert.     Cranial Nerves: No cranial nerve deficit.     Motor: No weakness.     Comments: Oriented to person and place as well as month and year but not to date     ED Results / Procedures / Treatments   Labs (all labs ordered are listed, but only abnormal results are displayed) Labs Reviewed  CBC - Abnormal; Notable for the following components:      Result Value   RBC 4.19 (*)    MCV 107.4 (*)    MCH 37.0 (*)    All other components within normal limits  URINALYSIS, ROUTINE W REFLEX MICROSCOPIC  COMPREHENSIVE METABOLIC PANEL  CBG MONITORING, ED    EKG None  Radiology CT Head Wo Contrast  Result Date: 12/12/2019 CLINICAL DATA:  Trauma, weakness. History of stroke 2 years ago. EXAM: CT HEAD WITHOUT CONTRAST TECHNIQUE: Contiguous axial images were obtained from the base of the skull through the vertex without intravenous contrast. COMPARISON:  Head CT dated 04/12/2018. FINDINGS: Brain: There is generalized age related parenchymal volume loss with commensurate dilatation of the ventricles and sulci. There is no mass, hemorrhage, edema or other evidence of acute parenchymal abnormality. No extra-axial hemorrhage. Vascular: No hyperdense vessel or unexpected calcification. Skull: Normal. Negative for fracture or focal lesion. Sinuses/Orbits: Chronic appearing mucosal thickening within the ethmoid air cells. Small mucous retention cysts within the maxillary sinuses. No acute findings. Other: None. IMPRESSION: Negative head CT. No intracranial mass, hemorrhage or  edema. Electronically Signed   By: Franki Cabot M.D.   On: 12/12/2019 10:16   DG Chest Port 1 View  Result Date: 12/12/2019 CLINICAL DATA:  Altered mental status EXAM: PORTABLE CHEST 1 VIEW COMPARISON:  August 31, 2020 FINDINGS: There is atelectatic change in the right base. There is stable elevation of the right hemidiaphragm. The lungs are otherwise clear. Heart is upper normal in  size with pulmonary vascularity normal. There is aortic atherosclerosis. No adenopathy. No bone lesions. IMPRESSION: Stable elevation of the right hemidiaphragm. Right base atelectasis. Lungs elsewhere clear. Stable cardiac silhouette. Aortic Atherosclerosis (ICD10-I70.0). Electronically Signed   By: Lowella Grip III M.D.   On: 12/12/2019 09:48    Procedures Procedures (including critical care time)  Medications Ordered in ED Medications - No data to display  ED Course  I have reviewed the triage vital signs and the nursing notes.  Pertinent labs & imaging results that were available during my care of the patient were reviewed by me and considered in my medical decision making (see chart for details).    MDM Rules/Calculators/A&P                      86 year old male presents today with generalized weakness.  He has no focal neurologic deficits noted on exam.  CT of head is normal.  CBC and chemistries are normal.  Patient was ambulated and appears walk with normal gait.  I discussed the results with his wife.  He had started tramadol on Thursday for back pain.  We discussed that he should hold the tramadol.  This weakness could certainly be a side effect of the tramadol.  Wife will stop the tramadol and call Dr. Clarice Pole office on Monday for assistance with further management of his back pain. Final Clinical Impression(s) / ED Diagnoses Final diagnoses:  Weakness    Rx / DC Orders ED Discharge Orders    None       Pattricia Boss, MD 12/12/19 1510

## 2019-12-12 NOTE — ED Notes (Signed)
Pt discharge instructions reviewed with the patient. The patient verbalized understanding of discharge instructions. Pt discharged.

## 2019-12-12 NOTE — ED Triage Notes (Signed)
Pt BIB [facility] from home. Complaint of weakness and slow to respond to questions. Pt with history of stroke 2 years ago, wife stated that this seems the same as his stroke 2 years ago. VSS. NAD. Pt did start taking tramadol recently.

## 2019-12-24 DIAGNOSIS — M5416 Radiculopathy, lumbar region: Secondary | ICD-10-CM | POA: Diagnosis not present

## 2019-12-28 DIAGNOSIS — M5116 Intervertebral disc disorders with radiculopathy, lumbar region: Secondary | ICD-10-CM | POA: Diagnosis not present

## 2020-01-01 DIAGNOSIS — H532 Diplopia: Secondary | ICD-10-CM | POA: Diagnosis not present

## 2020-02-09 DIAGNOSIS — J209 Acute bronchitis, unspecified: Secondary | ICD-10-CM | POA: Diagnosis not present

## 2020-02-20 DIAGNOSIS — J189 Pneumonia, unspecified organism: Secondary | ICD-10-CM | POA: Diagnosis not present

## 2020-02-22 DIAGNOSIS — J22 Unspecified acute lower respiratory infection: Secondary | ICD-10-CM | POA: Diagnosis not present

## 2020-02-29 DIAGNOSIS — Z8546 Personal history of malignant neoplasm of prostate: Secondary | ICD-10-CM | POA: Diagnosis not present

## 2020-05-17 DIAGNOSIS — Z8673 Personal history of transient ischemic attack (TIA), and cerebral infarction without residual deficits: Secondary | ICD-10-CM | POA: Diagnosis not present

## 2020-05-17 DIAGNOSIS — E782 Mixed hyperlipidemia: Secondary | ICD-10-CM | POA: Diagnosis not present

## 2020-05-17 DIAGNOSIS — K219 Gastro-esophageal reflux disease without esophagitis: Secondary | ICD-10-CM | POA: Diagnosis not present

## 2020-05-17 DIAGNOSIS — Z8546 Personal history of malignant neoplasm of prostate: Secondary | ICD-10-CM | POA: Diagnosis not present

## 2020-05-17 DIAGNOSIS — I700 Atherosclerosis of aorta: Secondary | ICD-10-CM | POA: Diagnosis not present

## 2020-05-17 DIAGNOSIS — Z6838 Body mass index (BMI) 38.0-38.9, adult: Secondary | ICD-10-CM | POA: Diagnosis not present

## 2020-05-17 DIAGNOSIS — Z125 Encounter for screening for malignant neoplasm of prostate: Secondary | ICD-10-CM | POA: Diagnosis not present

## 2020-05-17 DIAGNOSIS — I10 Essential (primary) hypertension: Secondary | ICD-10-CM | POA: Diagnosis not present

## 2020-05-17 DIAGNOSIS — E669 Obesity, unspecified: Secondary | ICD-10-CM | POA: Diagnosis not present

## 2020-05-17 DIAGNOSIS — Z0000 Encounter for general adult medical examination without abnormal findings: Secondary | ICD-10-CM | POA: Diagnosis not present

## 2020-05-17 DIAGNOSIS — H60311 Diffuse otitis externa, right ear: Secondary | ICD-10-CM | POA: Diagnosis not present

## 2020-06-01 DIAGNOSIS — M1711 Unilateral primary osteoarthritis, right knee: Secondary | ICD-10-CM | POA: Diagnosis not present

## 2020-06-01 DIAGNOSIS — E669 Obesity, unspecified: Secondary | ICD-10-CM | POA: Diagnosis not present

## 2020-06-01 DIAGNOSIS — Z6831 Body mass index (BMI) 31.0-31.9, adult: Secondary | ICD-10-CM | POA: Diagnosis not present

## 2020-06-02 DIAGNOSIS — L570 Actinic keratosis: Secondary | ICD-10-CM | POA: Diagnosis not present

## 2020-06-02 DIAGNOSIS — L719 Rosacea, unspecified: Secondary | ICD-10-CM | POA: Diagnosis not present

## 2020-06-02 DIAGNOSIS — L821 Other seborrheic keratosis: Secondary | ICD-10-CM | POA: Diagnosis not present

## 2020-06-02 DIAGNOSIS — L578 Other skin changes due to chronic exposure to nonionizing radiation: Secondary | ICD-10-CM | POA: Diagnosis not present

## 2020-06-09 DIAGNOSIS — M5416 Radiculopathy, lumbar region: Secondary | ICD-10-CM | POA: Diagnosis not present

## 2020-06-09 DIAGNOSIS — M5136 Other intervertebral disc degeneration, lumbar region: Secondary | ICD-10-CM | POA: Diagnosis not present

## 2020-07-12 DIAGNOSIS — Z23 Encounter for immunization: Secondary | ICD-10-CM | POA: Diagnosis not present

## 2020-10-11 DIAGNOSIS — M179 Osteoarthritis of knee, unspecified: Secondary | ICD-10-CM | POA: Diagnosis not present

## 2020-10-22 ENCOUNTER — Other Ambulatory Visit: Payer: Self-pay

## 2020-10-22 ENCOUNTER — Observation Stay (HOSPITAL_BASED_OUTPATIENT_CLINIC_OR_DEPARTMENT_OTHER): Payer: Medicare Other

## 2020-10-22 ENCOUNTER — Emergency Department (HOSPITAL_COMMUNITY): Payer: Medicare Other

## 2020-10-22 ENCOUNTER — Encounter (HOSPITAL_COMMUNITY): Payer: Self-pay | Admitting: Emergency Medicine

## 2020-10-22 ENCOUNTER — Observation Stay (HOSPITAL_COMMUNITY)
Admission: EM | Admit: 2020-10-22 | Discharge: 2020-10-23 | Disposition: A | Discharge status: Home / Self Care | Payer: Medicare Other | Attending: Internal Medicine | Admitting: Internal Medicine

## 2020-10-22 DIAGNOSIS — Z7982 Long term (current) use of aspirin: Secondary | ICD-10-CM | POA: Diagnosis not present

## 2020-10-22 DIAGNOSIS — J00 Acute nasopharyngitis [common cold]: Secondary | ICD-10-CM | POA: Diagnosis not present

## 2020-10-22 DIAGNOSIS — I130 Hypertensive heart and chronic kidney disease with heart failure and stage 1 through stage 4 chronic kidney disease, or unspecified chronic kidney disease: Secondary | ICD-10-CM | POA: Insufficient documentation

## 2020-10-22 DIAGNOSIS — R079 Chest pain, unspecified: Principal | ICD-10-CM | POA: Diagnosis present

## 2020-10-22 DIAGNOSIS — E785 Hyperlipidemia, unspecified: Secondary | ICD-10-CM | POA: Diagnosis not present

## 2020-10-22 DIAGNOSIS — N1831 Chronic kidney disease, stage 3a: Secondary | ICD-10-CM | POA: Insufficient documentation

## 2020-10-22 DIAGNOSIS — I5032 Chronic diastolic (congestive) heart failure: Secondary | ICD-10-CM | POA: Diagnosis not present

## 2020-10-22 DIAGNOSIS — Z79899 Other long term (current) drug therapy: Secondary | ICD-10-CM | POA: Diagnosis not present

## 2020-10-22 DIAGNOSIS — Z66 Do not resuscitate: Secondary | ICD-10-CM

## 2020-10-22 DIAGNOSIS — N179 Acute kidney failure, unspecified: Secondary | ICD-10-CM | POA: Diagnosis present

## 2020-10-22 DIAGNOSIS — Z20822 Contact with and (suspected) exposure to covid-19: Secondary | ICD-10-CM | POA: Insufficient documentation

## 2020-10-22 DIAGNOSIS — R072 Precordial pain: Secondary | ICD-10-CM | POA: Diagnosis not present

## 2020-10-22 DIAGNOSIS — R000 Tachycardia, unspecified: Secondary | ICD-10-CM | POA: Diagnosis not present

## 2020-10-22 DIAGNOSIS — I10 Essential (primary) hypertension: Secondary | ICD-10-CM | POA: Diagnosis not present

## 2020-10-22 DIAGNOSIS — J449 Chronic obstructive pulmonary disease, unspecified: Secondary | ICD-10-CM | POA: Insufficient documentation

## 2020-10-22 DIAGNOSIS — I5189 Other ill-defined heart diseases: Secondary | ICD-10-CM

## 2020-10-22 DIAGNOSIS — J069 Acute upper respiratory infection, unspecified: Secondary | ICD-10-CM | POA: Diagnosis present

## 2020-10-22 DIAGNOSIS — R0789 Other chest pain: Secondary | ICD-10-CM | POA: Diagnosis not present

## 2020-10-22 DIAGNOSIS — R410 Disorientation, unspecified: Secondary | ICD-10-CM | POA: Diagnosis not present

## 2020-10-22 HISTORY — DX: Do not resuscitate: Z66

## 2020-10-22 HISTORY — DX: Other chronic pain: G89.29

## 2020-10-22 LAB — ECHOCARDIOGRAM COMPLETE
Area-P 1/2: 2.63 cm2
Height: 71 in
S' Lateral: 2.5 cm
Weight: 3313.6 oz

## 2020-10-22 LAB — RESPIRATORY PANEL BY PCR

## 2020-10-22 LAB — RESP PANEL BY RT-PCR (FLU A&B, COVID) ARPGX2
Influenza A by PCR: NEGATIVE
Influenza B by PCR: NEGATIVE
SARS Coronavirus 2 by RT PCR: NEGATIVE

## 2020-10-22 LAB — BASIC METABOLIC PANEL
Anion gap: 14 (ref 5–15)
BUN: 45 mg/dL — ABNORMAL HIGH (ref 8–23)
CO2: 19 mmol/L — ABNORMAL LOW (ref 22–32)
Calcium: 9.5 mg/dL (ref 8.9–10.3)
Chloride: 104 mmol/L (ref 98–111)
Creatinine, Ser: 1.51 mg/dL — ABNORMAL HIGH (ref 0.61–1.24)
GFR, Estimated: 45 mL/min — ABNORMAL LOW (ref >60)
Glucose, Bld: 159 mg/dL — ABNORMAL HIGH (ref 70–99)
Potassium: 3.8 mmol/L (ref 3.5–5.1)
Sodium: 137 mmol/L (ref 135–145)

## 2020-10-22 LAB — D-DIMER, QUANTITATIVE: D-Dimer, Quant: 0.35 ug/mL-FEU (ref 0.00–0.50)

## 2020-10-22 LAB — HEMOGLOBIN A1C
Hgb A1c MFr Bld: 5.4 % (ref 4.8–5.6)
Mean Plasma Glucose: 108.28 mg/dL

## 2020-10-22 LAB — CBC
HCT: 43.1 % (ref 39.0–52.0)
Hemoglobin: 15.3 g/dL (ref 13.0–17.0)
MCH: 37.3 pg — ABNORMAL HIGH (ref 26.0–34.0)
MCHC: 35.5 g/dL (ref 30.0–36.0)
MCV: 105.1 fL — ABNORMAL HIGH (ref 80.0–100.0)
Platelets: 221 10*3/uL (ref 150–400)
RBC: 4.1 MIL/uL — ABNORMAL LOW (ref 4.22–5.81)
RDW: 11.6 % (ref 11.5–15.5)
WBC: 10.6 10*3/uL — ABNORMAL HIGH (ref 4.0–10.5)
nRBC: 0 % (ref 0.0–0.2)

## 2020-10-22 LAB — TROPONIN I (HIGH SENSITIVITY)
Troponin I (High Sensitivity): 21 ng/L — ABNORMAL HIGH (ref <18)
Troponin I (High Sensitivity): 36 ng/L — ABNORMAL HIGH (ref <18)
Troponin I (High Sensitivity): 48 ng/L — ABNORMAL HIGH (ref <18)
Troponin I (High Sensitivity): 53 ng/L — ABNORMAL HIGH (ref <18)

## 2020-10-22 LAB — LIPID PANEL
Cholesterol: 110 mg/dL (ref 0–200)
HDL: 34 mg/dL — ABNORMAL LOW (ref >40)
LDL Cholesterol: 62 mg/dL (ref 0–99)
Total CHOL/HDL Ratio: 3.2 RATIO
Triglycerides: 68 mg/dL (ref <150)
VLDL: 14 mg/dL (ref 0–40)

## 2020-10-22 LAB — TSH: TSH: 1.422 u[IU]/mL (ref 0.350–4.500)

## 2020-10-22 LAB — GROUP A STREP BY PCR: Group A Strep by PCR: NOT DETECTED

## 2020-10-22 MED ORDER — TRAMADOL HCL 50 MG PO TABS
50.0000 mg | ORAL_TABLET | Freq: Once | ORAL | Status: AC
  Administered 2020-10-22: 50 mg via ORAL
  Filled 2020-10-22: qty 1

## 2020-10-22 MED ORDER — NITROGLYCERIN 0.4 MG SL SUBL
0.4000 mg | SUBLINGUAL_TABLET | SUBLINGUAL | Status: AC | PRN
  Administered 2020-10-22 (×3): 0.4 mg via SUBLINGUAL
  Filled 2020-10-22: qty 1

## 2020-10-22 MED ORDER — ONE-DAILY MULTI VITAMINS PO TABS: 1.0000 | ORAL_TABLET | Freq: Every day | ORAL | Status: DC

## 2020-10-22 MED ORDER — ASPIRIN 81 MG PO CHEW
324.0000 mg | CHEWABLE_TABLET | Freq: Once | ORAL | Status: AC
  Administered 2020-10-22: 324 mg via ORAL
  Filled 2020-10-22: qty 4

## 2020-10-22 MED ORDER — ENOXAPARIN SODIUM 40 MG/0.4ML ~~LOC~~ SOLN
40.0000 mg | SUBCUTANEOUS | Status: DC
  Administered 2020-10-22: 40 mg via SUBCUTANEOUS
  Filled 2020-10-22: qty 0.4

## 2020-10-22 MED ORDER — PANTOPRAZOLE SODIUM 40 MG PO TBEC
80.0000 mg | DELAYED_RELEASE_TABLET | Freq: Every day | ORAL | Status: DC
  Administered 2020-10-22 – 2020-10-23 (×2): 80 mg via ORAL
  Filled 2020-10-22 (×2): qty 2

## 2020-10-22 MED ORDER — PSYLLIUM 95 % PO PACK
1.0000 | PACK | Freq: Every day | ORAL | Status: DC
  Administered 2020-10-22 – 2020-10-23 (×2): 1 via ORAL
  Filled 2020-10-22 (×2): qty 1

## 2020-10-22 MED ORDER — ADULT MULTIVITAMIN W/MINERALS CH
1.0000 | ORAL_TABLET | Freq: Every day | ORAL | Status: DC
  Administered 2020-10-22 – 2020-10-23 (×2): 1 via ORAL
  Filled 2020-10-22 (×2): qty 1

## 2020-10-22 MED ORDER — CETIRIZINE HCL 10 MG PO TABS
10.0000 mg | ORAL_TABLET | Freq: Every day | ORAL | Status: DC
  Administered 2020-10-22 – 2020-10-23 (×2): 10 mg via ORAL
  Filled 2020-10-22 (×2): qty 1

## 2020-10-22 MED ORDER — DIPHENHYDRAMINE HCL 25 MG PO CAPS
50.0000 mg | ORAL_CAPSULE | Freq: Every day | ORAL | Status: DC
  Administered 2020-10-22: 50 mg via ORAL
  Filled 2020-10-22: qty 2

## 2020-10-22 MED ORDER — ALBUTEROL SULFATE (2.5 MG/3ML) 0.083% IN NEBU: 2.5000 mg | INHALATION_SOLUTION | Freq: Four times a day (QID) | RESPIRATORY_TRACT | Status: DC | PRN

## 2020-10-22 MED ORDER — ONDANSETRON HCL 4 MG/2ML IJ SOLN: 4.0000 mg | Freq: Four times a day (QID) | INTRAMUSCULAR | Status: DC | PRN

## 2020-10-22 MED ORDER — FLUTICASONE PROPIONATE 50 MCG/ACT NA SUSP
2.0000 | Freq: Every day | NASAL | Status: DC
  Administered 2020-10-23: 2 via NASAL
  Filled 2020-10-22: qty 16

## 2020-10-22 MED ORDER — DIPHENHYDRAMINE-APAP (SLEEP) 25-500 MG PO TABS: 2.0000 | ORAL_TABLET | Freq: Every day | ORAL | Status: DC

## 2020-10-22 MED ORDER — LACTATED RINGERS IV SOLN: INTRAVENOUS | Status: AC

## 2020-10-22 MED ORDER — ATORVASTATIN CALCIUM 80 MG PO TABS
80.0000 mg | ORAL_TABLET | Freq: Every day | ORAL | Status: DC
  Administered 2020-10-22: 80 mg via ORAL
  Filled 2020-10-22: qty 1

## 2020-10-22 MED ORDER — HYDRALAZINE HCL 20 MG/ML IJ SOLN: 5.0000 mg | INTRAMUSCULAR | Status: DC | PRN

## 2020-10-22 MED ORDER — TRAMADOL HCL 50 MG PO TABS
100.0000 mg | ORAL_TABLET | Freq: Every day | ORAL | Status: DC
  Administered 2020-10-22 – 2020-10-23 (×2): 100 mg via ORAL
  Filled 2020-10-22 (×2): qty 2

## 2020-10-22 MED ORDER — ALUM & MAG HYDROXIDE-SIMETH 200-200-20 MG/5ML PO SUSP
30.0000 mL | Freq: Once | ORAL | Status: AC
  Administered 2020-10-22: 30 mL via ORAL
  Filled 2020-10-22: qty 30

## 2020-10-22 MED ORDER — ACETAMINOPHEN 500 MG PO TABS
1000.0000 mg | ORAL_TABLET | Freq: Every day | ORAL | Status: DC
  Administered 2020-10-22: 1000 mg via ORAL
  Filled 2020-10-22: qty 2

## 2020-10-22 MED ORDER — ENOXAPARIN SODIUM 40 MG/0.4ML ~~LOC~~ SOLN: 40.0000 mg | SUBCUTANEOUS | Status: DC

## 2020-10-22 MED ORDER — PREDNISONE 10 MG PO TABS
10.0000 mg | ORAL_TABLET | Freq: Every day | ORAL | Status: DC
  Administered 2020-10-22 – 2020-10-23 (×2): 10 mg via ORAL
  Filled 2020-10-22 (×2): qty 1

## 2020-10-22 MED ORDER — ASPIRIN EC 81 MG PO TBEC
81.0000 mg | DELAYED_RELEASE_TABLET | Freq: Every day | ORAL | Status: DC
  Administered 2020-10-22 – 2020-10-23 (×2): 81 mg via ORAL
  Filled 2020-10-22 (×2): qty 1

## 2020-10-22 MED ORDER — MENTHOL 3 MG MT LOZG
1.0000 | LOZENGE | OROMUCOSAL | Status: DC | PRN
  Filled 2020-10-22: qty 9

## 2020-10-22 MED ORDER — ACETAMINOPHEN 325 MG PO TABS: 650.0000 mg | ORAL_TABLET | ORAL | Status: DC | PRN

## 2020-10-22 NOTE — Progress Notes (Addendum)
Echocardiogram 2D Echocardiogram has been performed.  Oneal Deputy Romelle Reiley 10/22/2020, 3:09 PM

## 2020-10-22 NOTE — Progress Notes (Signed)
All 4 rails on bed are up per pt & dgtr request

## 2020-10-22 NOTE — Consult Note (Addendum)
Cardiology Consultation:   Patient ID: HARVY RIERA MRN: [id number]; DOB: 15-May-1932  Admit date: 10/22/2020 Date of Consult: 10/22/2020  PCP:  Greig Right, MD   [place]  Cardiologist:  New to [place] vs [place] (Patient's wife sees Dr. Percival Spanish and he is interested in also being seen at [facility]) Advanced Practice Provider:  None Electrophysiologist:  None    Patient Profile:   ARDIAN HABERLAND is a 87 y.o. male with a hx of hypertension, hyperlipidemia, CVA on 04/12/2018, COPD, and chronic elevated right hemidiaphragm who is being seen today for the evaluation of chest pain at the request of Dr. Lorin Mercy.  History of Present Illness:   Mr. Mendibles is a 87 year old male was past medical history of hypertension, hyperlipidemia, CVA on 04/12/2018, COPD, and chronic elevated right hemidiaphragm.  According to patient, he was seen by a cardiologist over 18 years ago in [place] and had stress test that was reportedly normal at the time.  That was before he and his wife moved to [place] area.  He has not been established with a cardiologist since.  He had a stroke in August 2019, echocardiogram obtained on 04/13/2018 showed EF 55 to 60%, grade 1 DD, no regional wall motion abnormality.  Over the past several years, he has been having intermittent chest discomfort with exertion at least once to twice a week.  He never sought medical attention for these episodes.  He does not have any lower extremity edema, orthopnea or PND.  He presented to the ED on 10/22/2020 with runny nose and chest congestion.  He says he usually get a runny nose and chest congestion every spring, this is different from his usual chest pain which is located in the left chest.  Covid test was negative.  Creatinine was elevated at 1.5 whereas it was 1.03 about 5-month ago.  EKG shows sinus rhythm without obvious ST-T wave changes.  Chest x-ray showed no acute disease, chronically elevated  right hemidiaphragm.  Cardiology has been consulted for chest pain.   Past Medical History:  Diagnosis Date  . Chronic back pain   . COPD (chronic obstructive pulmonary disease) ([place])   . DNR (do not resuscitate) 10/22/2020  . Hypercholesteremia   . Hypertension   . Stroke [facility])     Past Surgical History:  Procedure Laterality Date  . BACK SURGERY    . JOINT REPLACEMENT     arthroscopic surgery rt side     Home Medications:  Prior to Admission medications   Medication Sig Start Date End Date Taking? Authorizing Provider  acetaminophen (TYLENOL) 325 MG tablet Take 1-2 tablets (325-650 mg total) by mouth every 4 (four) hours as needed for mild pain. 04/22/18   Angiulli, Lavon Paganini, PA-C  aspirin EC 81 MG EC tablet Take 1 tablet (81 mg total) by mouth daily. 04/15/18   Ledell Noss, MD  atorvastatin (LIPITOR) 80 MG tablet Take 1 tablet (80 mg total) by mouth daily at 6 PM. 04/22/18   Angiulli, Lavon Paganini, PA-C  Cetirizine HCl (ZYRTEC PO) Take 10 mg by mouth.    [provider]  Multiple Vitamin (MULTIVITAMIN) tablet Take 1 tablet by mouth daily.    [provider]  olmesartan-hydrochlorothiazide (BENICAR HCT) 40-12.5 MG tablet Take 1 tablet by mouth daily.    [provider]  pantoprazole (PROTONIX) 40 MG tablet Take 2 tablets (80 mg total) by mouth daily. 04/22/18   Angiulli, Lavon Paganini, PA-C  psyllium (METAMUCIL) 58.6 % powder  Take 1 packet by mouth daily.    [provider]    Inpatient Medications: Scheduled Meds: . aspirin  81 mg Oral Daily  . atorvastatin  80 mg Oral q1800  . cetirizine  10 mg Oral Daily  . enoxaparin (LOVENOX) injection  40 mg Subcutaneous Q24H  . fluticasone  2 spray Each Nare Daily  . multivitamin  1 tablet Oral Daily  . pantoprazole  80 mg Oral Daily  . psyllium  1 packet Oral Daily   Continuous Infusions: . lactated ringers     PRN Meds: acetaminophen, hydrALAZINE, menthol-cetylpyridinium, ondansetron (ZOFRAN)  IV  Allergies:   No Known Allergies  Social History:   Social History   Socioeconomic History  . Marital status: Married    Spouse name: Not on file  . Number of children: Not on file  . Years of education: Not on file  . Highest education level: Not on file  Occupational History  . Not on file  Tobacco Use  . Smoking status: Never Smoker  . Smokeless tobacco: Never Used  Substance and Sexual Activity  . Alcohol use: Not Currently  . Drug use: Never  . Sexual activity: Not on file  Other Topics Concern  . Not on file  Social History Narrative  . Not on file   Social Determinants of Health   Financial Resource Strain: Not on file  Food Insecurity: Not on file  Transportation Needs: Not on file  Physical Activity: Not on file  Stress: Not on file  Social Connections: Not on file  Intimate Partner Violence: Not on file    Family History:    Family History  Problem Relation Age of Onset  . Lung disease Mother        never smoker  . Heart disease Father      ROS:  Please see the history of present illness.  Notes Headcold All other ROS reviewed and negative.     Physical Exam/Data:   Vitals:   10/22/20 0645 10/22/20 0700 10/22/20 0715 10/22/20 0747  BP: 140/86 138/87 134/81   Pulse: 95 (!) 101 90   Resp: 17 18 (!) 25   Temp:    97.6 F (36.4 C)  TempSrc:    Oral  SpO2: 98% 98% 98%    No intake or output data in the 24 hours ending 10/22/20 0933 Last 3 Weights 12/12/2019 02/24/2019 08/25/2018  Weight (lbs) 210 lb 207 lb 209 lb 3.2 oz  Weight (kg) 95.255 kg 93.895 kg 94.892 kg     There is no height or weight on file to calculate BMI.  General:  Well nourished, well developed, in no acute distress HEENT: normal Lymph: no adenopathy Neck: no JVD Endocrine:  No thryomegaly Vascular: No carotid bruits; FA pulses 2+ bilaterally without bruits  Cardiac:  normal S1, S2; RRR; no murmur  Lungs:  clear to auscultation bilaterally, no wheezing, rhonchi or rales   Abd: soft, nontender, no hepatomegaly  Ext: no edema Musculoskeletal:  No deformities, BUE and BLE strength normal and equal Skin: warm and dry  Neuro:  CNs 2-12 intact, no focal abnormalities noted Psych:  Normal affect   EKG:  The EKG was personally reviewed and demonstrates: Normal sinus rhythm without significant ST-T wave changes Telemetry:  Telemetry was personally reviewed and demonstrates: Sinus rhythm with no acute ventricular ectopy  Relevant CV Studies:  Echo 04/13/2018 LV EF: 55% -  60%  Study Conclusions   - Left ventricle: The cavity size was  normal. Wall thickness was  increased in a pattern of mild LVH. Systolic function was normal.  The estimated ejection fraction was in the range of 55% to 60%.  Wall motion was normal; there were no regional wall motion  abnormalities. Doppler parameters are consistent with abnormal  left ventricular relaxation (grade 1 diastolic dysfunction).    Laboratory Data:  High Sensitivity Troponin:   Recent Labs  Lab 10/22/20 0129 10/22/20 0336  TROPONINIHS 21* 36*     Chemistry Recent Labs  Lab 10/22/20 0129  NA 137  K 3.8  CL 104  CO2 19*  GLUCOSE 159*  BUN 45*  CREATININE 1.51*  CALCIUM 9.5  GFRNONAA 45*  ANIONGAP 14    No results for input(s): PROT, ALBUMIN, AST, ALT, ALKPHOS, BILITOT in the last 168 hours. Hematology Recent Labs  Lab 10/22/20 0129  WBC 10.6*  RBC 4.10*  HGB 15.3  HCT 43.1  MCV 105.1*  MCH 37.3*  MCHC 35.5  RDW 11.6  PLT 221   BNPNo results for input(s): BNP, PROBNP in the last 168 hours.  DDimer  Recent Labs  Lab 10/22/20 0129  DDIMER 0.35     Radiology/Studies:  DG Chest Portable 1 View  Result Date: 10/22/2020 CLINICAL DATA:  Chest pain over the last day. EXAM: PORTABLE CHEST 1 VIEW COMPARISON:  12/12/2019 FINDINGS: Heart size is normal. Chronic atherosclerotic calcification and tortuosity of the aorta. Chronic elevation of right hemidiaphragm with mild chronic  volume loss at the right base. Chronic interstitial lung markings. No sign of active infiltrate, effusion or collapse. No pulmonary edema. IMPRESSION: No active disease. Chronic elevation of the right hemidiaphragm with chronic volume loss at the right base. Chronic interstitial markings. Aortic atherosclerosis and tortuosity. Electronically Signed   By: Nelson Chimes M.D.   On: 10/22/2020 02:21     Assessment and Plan:   1. Chest pain  -Although his main presentation is URI symptoms versus allergies, patient does endorse intermittent chest pain that has been going on for years that is not related to his presenting symptom.  -Chest discomfort usually occurs with physical activity.  EKG shows no obvious changes.  -His chest pain is very concerning, cardiac risk factors include advanced age, family history of CAD, hypertension and hyperlipidemia.  Will discuss with MD, potentially set up for stress test tomorrow morning.  Hesitant for coronary CT due to AKI with creatinine of 1.5.  2. Upper URI versus seasonal allergy  -Per primary team  3. Hypertension: Usually well controlled at home on combination of olmesartan-hydrochlorothiazide.  4. Hyperlipidemia: On Lipitor at home  5. History of CVA on 04/12/2018  6. COPD  7. AKI: On olmesartan-hydrochlorothiazide at home.  Currently on hold due to elevation of the creatinine.  10 months ago, his creatinine was 1.0, now is 1.5.  8. Chronically elevated right hemidiaphragm   Risk Assessment/Risk Scores:     HEAR Score (for undifferentiated chest pain):  HEAR Score: 5        For questions or updates, please contact [place] Please consult [email or web address] for contact info under    Signed, Almyra Deforest, [place]  10/22/2020 9:33 AM  Personally seen and examined. Agree with APP above with the following comments: Briefly 87 yo M with no prior history of CAD but with risk factors who has had chest pain with exertion for one year.   Patient notes  chest pain is worse with activity, does not radiate, and improves with rest. Exam notable for [employer], otherwise  relatively benign Labs notable for Creatinine 1.51; novel troponin 21-34-48-53. Personally reviewed relevant tests; ECG not consistent with inferior STEMI Would recommend echocardiogram (ordered) - if no WMAs would defer invasive inpatient measures.  Otherwise, will evaluation AKI recovery and patient symptoms to decide next steps.  Discussed with patient, daughter, and with primary MD. Will continue to follow into 10/23/20.

## 2020-10-22 NOTE — H&P (Addendum)
History and Physical    Duane Park UEA:[id number] DOB: 03-01-1932 DOA: 10/22/2020  PCP: Greig Right, MD Consultants:  Ellene Route - neurosurgery; Leonie Man - neurology Patient coming from:  Home - lives with wife; NOK: Wife, [phone]  Chief Complaint: chest pain  HPI: Duane Park is a 87 y.o. male with medical history significant of CVA; HTN; HLD; and COPD presenting with chest pain.  He had a lot of nasal and chest congestion.  His left chest was quite painful - but that has calmed down.  He is having back pain now and his mouth is dry.  Congestion for a couple of days.  Chest pain also started a couple of days ago.  It got better with NTG x 4.  + SOB.  Remote h/o stress test, has never had a cath.      ED Course:  Carryover, per Dr. Hal Hope:  Chest pain. Cardiology to see. Mildly elevated troponin.  Review of Systems: As per HPI; otherwise review of systems reviewed and negative.   Ambulatory Status:  Ambulates with a walker  COVID Vaccine Status:  Complete plus booster  Past Medical History:  Diagnosis Date  . Chronic back pain   . COPD (chronic obstructive pulmonary disease) ([place])   . DNR (do not resuscitate) 10/22/2020  . Hypercholesteremia   . Hypertension   . Stroke [facility])     Past Surgical History:  Procedure Laterality Date  . BACK SURGERY    . JOINT REPLACEMENT     arthroscopic surgery rt side    Social History   Socioeconomic History  . Marital status: Married    Spouse name: Not on file  . Number of children: Not on file  . Years of education: Not on file  . Highest education level: Not on file  Occupational History  . Not on file  Tobacco Use  . Smoking status: Never Smoker  . Smokeless tobacco: Never Used  Substance and Sexual Activity  . Alcohol use: Not Currently  . Drug use: Never  . Sexual activity: Not on file  Other Topics Concern  . Not on file  Social History Narrative  . Not on file   Social Determinants of Health    Financial Resource Strain: Not on file  Food Insecurity: Not on file  Transportation Needs: Not on file  Physical Activity: Not on file  Stress: Not on file  Social Connections: Not on file  Intimate Partner Violence: Not on file    No Known Allergies  Family History  Problem Relation Age of Onset  . Lung disease Mother        never smoker  . Heart disease Father     Prior to Admission medications   Medication Sig Start Date End Date Taking? Authorizing Provider  acetaminophen (TYLENOL) 325 MG tablet Take 1-2 tablets (325-650 mg total) by mouth every 4 (four) hours as needed for mild pain. 04/22/18   Angiulli, Lavon Paganini, PA-C  aspirin EC 81 MG EC tablet Take 1 tablet (81 mg total) by mouth daily. 04/15/18   Ledell Noss, MD  atorvastatin (LIPITOR) 80 MG tablet Take 1 tablet (80 mg total) by mouth daily at 6 PM. 04/22/18   Angiulli, Lavon Paganini, PA-C  Cetirizine HCl (ZYRTEC PO) Take 10 mg by mouth.    [provider]  Multiple Vitamin (MULTIVITAMIN) tablet Take 1 tablet by mouth daily.    [provider]  olmesartan-hydrochlorothiazide (BENICAR HCT) 40-12.5 MG tablet Take 1 tablet by mouth daily.  [provider]  pantoprazole (PROTONIX) 40 MG tablet Take 2 tablets (80 mg total) by mouth daily. 04/22/18   Angiulli, Lavon Paganini, PA-C  psyllium (METAMUCIL) 58.6 % powder Take 1 packet by mouth daily.    [provider]    Physical Exam: Vitals:   10/22/20 0645 10/22/20 0700 10/22/20 0715 10/22/20 0747  BP: 140/86 138/87 134/81   Pulse: 95 (!) 101 90   Resp: 17 18 (!) 25   Temp:    97.6 F (36.4 C)  TempSrc:    Oral  SpO2: 98% 98% 98%      . General:  Appears calm and comfortable and is in NAD; mild nasal congestion and periodic cough . Eyes:  PERRL, EOMI, normal lids, iris . ENT:  grossly normal hearing, lips & tongue, mmm; pharyngeal erythema noted without exudates . Neck:  no LAD, masses or thyromegaly . Cardiovascular:  RRR, no m/r/g. No LE  edema.  Marland Kitchen Respiratory:   CTA bilaterally with no wheezes/rales/rhonchi.  Normal respiratory effort.  Periodic cough with some sputum production. . Abdomen:  soft, NT, ND, NABS . Back:   normal alignment, no CVAT . Skin:  no rash or induration seen on limited exam . Musculoskeletal:  grossly normal tone BUE/BLE, good ROM, no bony abnormality . Lower extremity:  No LE edema.  Limited foot exam with no ulcerations.  2+ distal pulses. Marland Kitchen Psychiatric:  grossly normal mood and affect, speech fluent and appropriate, AOx3 . Neurologic:  CN 2-12 grossly intact, moves all extremities in coordinated fashion    Radiological Exams on Admission: Independently reviewed - see discussion in A/P where applicable  DG Chest Portable 1 View  Result Date: 10/22/2020 CLINICAL DATA:  Chest pain over the last day. EXAM: PORTABLE CHEST 1 VIEW COMPARISON:  12/12/2019 FINDINGS: Heart size is normal. Chronic atherosclerotic calcification and tortuosity of the aorta. Chronic elevation of right hemidiaphragm with mild chronic volume loss at the right base. Chronic interstitial lung markings. No sign of active infiltrate, effusion or collapse. No pulmonary edema. IMPRESSION: No active disease. Chronic elevation of the right hemidiaphragm with chronic volume loss at the right base. Chronic interstitial markings. Aortic atherosclerosis and tortuosity. Electronically Signed   By: Nelson Chimes M.D.   On: 10/22/2020 02:21    EKG: Independently reviewed.   0126 - Sinus tachycardia with rate 106; LAFB; nonspecific ST changes with NSCSLT 0525 -  NSR with rate 91; LAFB; nonspecific ST changes    Labs on Admission: I have personally reviewed the available labs and imaging studies at the time of the admission.  Pertinent labs:   CO2 19 Glucose 159 BUN 45/Creatinine 1.51/GFR 45; 23/1.03/>0 on 12/12/19 HS troponin 21, 3 WBC 10.6 COVID/flu negative   Assessment/Plan Principal Problem:   Chest pain Active Problems:    Dyslipidemia   Essential hypertension   Diastolic dysfunction   URI (upper respiratory infection)   AKI (acute kidney injury) ([place])   DNR (do not resuscitate)    Chest pain -Patient with left-sided chest pain that started 3 days ago in the setting of URI symptoms -Pain resolved with NTG x 4 in the ER -CXR unremarkable.   -Initial cardiac HS troponin minimally elevated with mild elevation on repeat but negative delta; will continue to trend to peak.  -EKG not indicative of acute ischemia.   -This may be demand ischemia associated with his viral illness but he does have significant ASCVD RF -HEART pathway score is 5, indicating that the patient has an  elevated risk score and requires further evaluation. -Will plan to place in observation status on telemetry to rule out ACS by overnight observation.  -Repeat EKG in AM -Continue ASA 81 mg daily -Risk factor stratification with HgbA1c and FLP; will also check TSH  -Cardiology consultation requested -NPO for possible stress test   URI -Acute onset of nasal congestion and cough 3 days ago -Negative CXR -Negative COVID/flu testing -Will check respiratory virus panel and strep PCR -Will continue Zyrtec and add Flonase and Cepacol lozenges  AKI -Normal baseline renal function as of 12/2019 but now with creatinine 1.51 -Will hold ARB/HCTZ -Gentle IVF hydration x 24 hours  HTN -Takes Benicar-HCTZ monotherapy at home - will hold given AKI -Will add prn hydralazine  HLD -Continue high-dose Lipitor -Check lipids  Chronic diastolic CHF -Patient with echo in 04/2018 with preserved EF and grade 1 diastolic dysfunction -Appears to be volume deficient at this time but will need to be monitored while giving IVF  Chronic pain -I have reviewed this patient in the [place] Controlled Substances Reporting System.  He is receiving medications from only one provider and appears to be taking them as prescribed. -He is not at particularly high risk of  opioid misuse, diversion, or overdose. -Will continue Ultram. -He takes prednisone 10 mg daily prn - possibly for pain? Will continue daily for now, as this may help his URI symptoms.  DNR -I have discussed code status with the patient and his daughter and  they are in agreement that the patient would not desire resuscitation and would prefer to die a natural death should that situation arise. -He will need a gold out of facility DNR form at the time of discharge    Note: This patient has been tested and is negative for the novel coronavirus COVID-19. He has been fully vaccinated against COVID-19.   Level of care: Telemetry Cardiac DVT prophylaxis: Lovenox  Code Status:  DNR - confirmed with patient/family Family Communication: Daughter was present throughout evaluation Disposition Plan:  The patient is from: home  Anticipated d/c is to: home without [facility] services  Anticipated d/c date will depend on clinical response to treatment, but possibly as early as tomorrow if he has excellent response to treatment  Patient is currently: acutely ill Consults called: Cardiology  Admission status: It is my clinical opinion that referral for OBSERVATION is reasonable and necessary in this patient based on the above information provided. The aforementioned taken together are felt to place the patient at high risk for further clinical deterioration. However it is anticipated that the patient may be medically stable for discharge from the hospital within 24 to 48 hours.     Karmen Bongo MD [facility]   How to contact the [facility] Attending or Consulting provider [place] or covering provider during after hours [place], for this patient?  1. Check the care team in [facility] and look for a) attending/consulting TR[facility] provider listed and b) the [facility] team listed 2. Log into [email or web address] and use [place]'s universal password to access. If you do not have the password, please contact the hospital  operator. 3. Locate the [facility] provider you are looking for under [facility] and page to a number that you can be directly reached. 4. If you still have difficulty reaching the provider, please page the [facility] (Director on Call) for the Hospitalists listed on amion for assistance.   10/22/2020, 8:56 AM

## 2020-10-22 NOTE — ED Provider Notes (Signed)
[place] EMERGENCY DEPARTMENT Provider Note   CSN: [id number] Arrival date & time: 10/22/20  0122     History Chief Complaint  Patient presents with  . Chest Pain    Duane Park is a 87 y.o. male.  87 yo M with a chief complaints of chest pain.  He describes this is congestion that goes from his nose to the upper portion of his chest.  He is trying to cough but unable to get anything up.  He says he just feels off.  No fevers or chills.  Does not normally get up and walk around that much.  Denies abdominal pain denies nausea or vomiting.  He has had symptoms like this in the past and he told me that it was presumed that it was a virus.  Denies history of MI has a history of hypertension and hyperlipidemia denies diabetes smoking.  Mother and father both had an MI in the past.  Denies history of PE or DVT denies hemoptysis denies unilateral lower extremity edema denies recent surgery immobilization or hospitalization.  Denies testosterone use.  Remote history of prostate cancer.  The history is provided by the patient.  Chest Pain Pain location:  Substernal area Pain quality: aching   Pain radiates to:  Does not radiate Pain severity:  Mild Onset quality:  Gradual Duration:  1 day Timing:  Intermittent Progression:  Waxing and waning Chronicity:  Recurrent Relieved by:  Nothing Worsened by:  Nothing Ineffective treatments:  None tried Associated symptoms: shortness of breath   Associated symptoms: no abdominal pain, no fever, no headache, no palpitations and no vomiting   Risk factors: high cholesterol, hypertension and male sex   Risk factors: no coronary artery disease, no diabetes mellitus, no prior DVT/PE and no smoking        Past Medical History:  Diagnosis Date  . COPD (chronic obstructive pulmonary disease) ([place])   . Hypercholesteremia   . Hypertension   . Stroke [facility])     Patient Active Problem List   Diagnosis Date Noted  .  Small vessel disease ([place]) 04/14/2018  . Diastolic dysfunction   . Stage 3 chronic kidney disease ([place])   . Aortic atherosclerosis ([place]) 04/13/2018  . Cerebral thrombosis with cerebral infarction 04/13/2018  . Cerebrovascular accident (CVA) ([place])   . Dyslipidemia   . Essential hypertension   . Gastroesophageal reflux disease   . Weakness 04/12/2018  . Chronic cough 03/28/2018  . OSTEOARTHRITIS, BACK 09/13/2009  . ARTHRITIS, KNEES, BILATERAL 09/13/2009  . 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 ABNORMALITY OF GAIT 09/13/2009    Past Surgical History:  Procedure Laterality Date  . BACK SURGERY    . JOINT REPLACEMENT     arthroscopic surgery rt side       Family History  Problem Relation Age of Onset  . Lung disease Mother        never smoker  . Heart disease Father     Social History   Tobacco Use  . Smoking status: Never Smoker  . Smokeless tobacco: Never Used  Substance Use Topics  . Alcohol use: Not Currently  . Drug use: Never    Home Medications Prior to Admission medications   Medication Sig Start Date End Date Taking? Authorizing Provider  acetaminophen (TYLENOL) 325 MG tablet Take 1-2 tablets (325-650 mg total) by mouth every 4 (four) hours as needed for mild pain. 04/22/18   Angiulli, Lavon Paganini, PA-C  aspirin EC 81 MG  EC tablet Take 1 tablet (81 mg total) by mouth daily. 04/15/18   Ledell Noss, MD  atorvastatin (LIPITOR) 80 MG tablet Take 1 tablet (80 mg total) by mouth daily at 6 PM. 04/22/18   Angiulli, Lavon Paganini, PA-C  Cetirizine HCl (ZYRTEC PO) Take 10 mg by mouth.    [provider]  Multiple Vitamin (MULTIVITAMIN) tablet Take 1 tablet by mouth daily.    [provider]  olmesartan-hydrochlorothiazide (BENICAR HCT) 40-12.5 MG tablet Take 1 tablet by mouth daily.    [provider]  pantoprazole (PROTONIX) 40 MG tablet Take 2 tablets (80 mg total) by mouth daily. 04/22/18   Angiulli, Lavon Paganini, PA-C  psyllium (METAMUCIL) 58.6 %  powder Take 1 packet by mouth daily.    [provider]    Allergies    Patient has no known allergies.  Review of Systems   Review of Systems  Constitutional: Negative for chills and fever.  HENT: Positive for congestion. Negative for facial swelling.   Eyes: Negative for discharge and visual disturbance.  Respiratory: Positive for shortness of breath.   Cardiovascular: Positive for chest pain. Negative for palpitations.  Gastrointestinal: Negative for abdominal pain, diarrhea and vomiting.  Musculoskeletal: Negative for arthralgias and myalgias.  Skin: Negative for color change and rash.  Neurological: Negative for tremors, syncope and headaches.  Psychiatric/Behavioral: Negative for confusion and dysphoric mood.    Physical Exam Updated Vital Signs BP (!) 145/82   Pulse (!) 111   Temp 97.7 F (36.5 C) (Oral)   Resp 16   SpO2 96%   Physical Exam Vitals and nursing note reviewed.  Constitutional:      Appearance: He is well-developed and well-nourished.  HENT:     Head: Normocephalic and atraumatic.  Eyes:     Extraocular Movements: EOM normal.     Pupils: Pupils are equal, round, and reactive to light.  Neck:     Vascular: No JVD.  Cardiovascular:     Rate and Rhythm: Normal rate and regular rhythm.     Heart sounds: No murmur heard. No friction rub. No gallop.   Pulmonary:     Effort: No respiratory distress.     Breath sounds: No wheezing.  Abdominal:     General: There is no distension.     Tenderness: There is no guarding or rebound.  Musculoskeletal:        General: Normal range of motion.     Cervical back: Normal range of motion and neck supple.  Skin:    Coloration: Skin is not pale.     Findings: No rash.  Neurological:     Mental Status: He is alert and oriented to person, place, and time.  Psychiatric:        Mood and Affect: Mood and affect normal.        Behavior: Behavior normal.     ED Results / Procedures / Treatments    Labs (all labs ordered are listed, but only abnormal results are displayed) Labs Reviewed  BASIC METABOLIC PANEL - Abnormal; Notable for the following components:      Result Value   CO2 19 (*)    Glucose, Bld 159 (*)    BUN 45 (*)    Creatinine, Ser 1.51 (*)    GFR, Estimated 45 (*)    All other components within normal limits  CBC - Abnormal; Notable for the following components:   WBC 10.6 (*)    RBC 4.10 (*)  MCV 105.1 (*)    MCH 37.3 (*)    All other components within normal limits  TROPONIN I (HIGH SENSITIVITY) - Abnormal; Notable for the following components:   Troponin I (High Sensitivity) 21 (*)    All other components within normal limits  TROPONIN I (HIGH SENSITIVITY) - Abnormal; Notable for the following components:   Troponin I (High Sensitivity) 36 (*)    All other components within normal limits  RESP PANEL BY RT-PCR (FLU A&B, COVID) ARPGX2  D-DIMER, QUANTITATIVE (NOT AT [facility])  CBG MONITORING, ED    EKG EKG Interpretation  Date/Time:  Saturday October 22 2020 01:26:21 EST Ventricular Rate:  106 PR Interval:    QRS Duration: 106 QT Interval:  349 QTC Calculation: 464 R Axis:   -53 Text Interpretation: Sinus tachycardia Consider left atrial enlargement Left anterior fascicular block Abnormal R-wave progression, late transition No significant change since last tracing Confirmed by Deno Etienne [phone]) on 10/22/2020 1:35:13 AM   Radiology DG Chest Portable 1 View  Result Date: 10/22/2020 CLINICAL DATA:  Chest pain over the last day. EXAM: PORTABLE CHEST 1 VIEW COMPARISON:  12/12/2019 FINDINGS: Heart size is normal. Chronic atherosclerotic calcification and tortuosity of the aorta. Chronic elevation of right hemidiaphragm with mild chronic volume loss at the right base. Chronic interstitial lung markings. No sign of active infiltrate, effusion or collapse. No pulmonary edema. IMPRESSION: No active disease. Chronic elevation of the right hemidiaphragm with  chronic volume loss at the right base. Chronic interstitial markings. Aortic atherosclerosis and tortuosity. Electronically Signed   By: Nelson Chimes M.D.   On: 10/22/2020 02:21    Procedures Procedures   Medications Ordered in ED Medications  aspirin chewable tablet 324 mg (324 mg Oral Given 10/22/20 0331)  alum & mag hydroxide-simeth (MAALOX/MYLANTA) 200-200-20 MG/5ML suspension 30 mL (30 mLs Oral Given 10/22/20 0331)  traMADol (ULTRAM) tablet 50 mg (50 mg Oral Given 10/22/20 0351)  nitroGLYCERIN (NITROSTAT) SL tablet 0.4 mg (0.4 mg Sublingual Given 10/22/20 4097)    ED Course  I have reviewed the triage vital signs and the nursing notes.  Pertinent labs & imaging results that were available during my care of the patient were reviewed by me and considered in my medical decision making (see chart for details).    MDM Rules/Calculators/A&P                          87 yo M with a chief complaints of chest pain.  He tells me he feels like he is congested.  Going on since yesterday.  Will obtain a chest pain work-up.  Chest x-ray Covid test.  Trop elevated at 21. Repeat to 36.  Will discuss with cards.  covid negative. ddimer negative.   I discussed the case with Dr. Neena Rhymes, cardiology with the patient having atypical symptoms and a fairly low troponin recommended medical admission and they would come and consult. Will discuss with medicine.  CRITICAL CARE Performed by: Cecilio Asper   Total critical care time: 35 minutes  Critical care time was exclusive of separately billable procedures and treating other patients.  Critical care was necessary to treat or prevent imminent or life-threatening deterioration.  Critical care was time spent personally by me on the following activities: development of treatment plan with patient and/or surrogate as well as nursing, discussions with consultants, evaluation of patient's response to treatment, examination of patient, obtaining  history from patient or surrogate, ordering and performing treatments  and interventions, ordering and review of laboratory studies, ordering and review of radiographic studies, pulse oximetry and re-evaluation of patient's condition.  The patients results and plan were reviewed and discussed.   Any x-rays performed were independently reviewed by myself.   Differential diagnosis were considered with the presenting HPI.  Medications  aspirin chewable tablet 324 mg (324 mg Oral Given 10/22/20 0331)  alum & mag hydroxide-simeth (MAALOX/MYLANTA) 200-200-20 MG/5ML suspension 30 mL (30 mLs Oral Given 10/22/20 0331)  traMADol (ULTRAM) tablet 50 mg (50 mg Oral Given 10/22/20 0351)  nitroGLYCERIN (NITROSTAT) SL tablet 0.4 mg (0.4 mg Sublingual Given 10/22/20 0537)    Vitals:   10/22/20 0530 10/22/20 0534 10/22/20 0536 10/22/20 0538  BP: (!) 154/90 128/80 129/86 (!) 145/82  Pulse: (!) 105 (!) 110 (!) 115 (!) 111  Resp: 19 18 20 16   Temp:      TempSrc:      SpO2: 96% 97% 96% 96%    Final diagnoses:  Chest pain with moderate risk for cardiac etiology    Admission/ observation were discussed with the admitting physician, patient and/or family and they are comfortable with the plan.    Final Clinical Impression(s) / ED Diagnoses Final diagnoses:  Chest pain with moderate risk for cardiac etiology    Rx / DC Orders ED Discharge Orders    None       Deno Etienne, DO 10/22/20 [phone]

## 2020-10-22 NOTE — Plan of Care (Signed)
  Problem: Education: Goal: Understanding of cardiac disease, CV risk reduction, and recovery process will improve Outcome: Progressing   Problem: Activity: Goal: Ability to tolerate increased activity will improve Outcome: Progressing   Problem: Cardiac: Goal: Ability to achieve and maintain adequate cardiovascular perfusion will improve Outcome: Progressing   Problem: Health Behavior/Discharge Planning: Goal: Ability to safely manage health-related needs after discharge will improve Outcome: Progressing   Problem: Education: Goal: Knowledge of General Education information will improve Description: Including pain rating scale, medication(s)/side effects and non-pharmacologic comfort measures Outcome: Progressing   Problem: Health Behavior/Discharge Planning: Goal: Ability to manage health-related needs will improve Outcome: Progressing   Problem: Clinical Measurements: Goal: Ability to maintain clinical measurements within normal limits will improve Outcome: Progressing Goal: Will remain free from infection Outcome: Progressing Goal: Cardiovascular complication will be avoided Outcome: Progressing   Problem: Activity: Goal: Risk for activity intolerance will decrease Outcome: Progressing   Problem: Nutrition: Goal: Adequate nutrition will be maintained Outcome: Progressing   Problem: Elimination: Goal: Will not experience complications related to bowel motility Outcome: Progressing Goal: Will not experience complications related to urinary retention Outcome: Progressing   Problem: Pain Managment: Goal: General experience of comfort will improve Outcome: Progressing   Problem: Safety: Goal: Ability to remain free from injury will improve Outcome: Progressing   Problem: Skin Integrity: Goal: Risk for impaired skin integrity will decrease Outcome: Progressing

## 2020-10-22 NOTE — ED Triage Notes (Signed)
Patient is from home, started having tightness in upper chest for a day.  Patient having some slow responses to questions, patient started feeling better, was given 324mg  ASA en route to ED.  No nausea, no vomiting.  Patient has had some congestion.  No fevers per patient.

## 2020-10-23 DIAGNOSIS — R079 Chest pain, unspecified: Secondary | ICD-10-CM | POA: Diagnosis not present

## 2020-10-23 DIAGNOSIS — Z79899 Other long term (current) drug therapy: Secondary | ICD-10-CM | POA: Diagnosis not present

## 2020-10-23 DIAGNOSIS — N1831 Chronic kidney disease, stage 3a: Secondary | ICD-10-CM | POA: Diagnosis not present

## 2020-10-23 DIAGNOSIS — J069 Acute upper respiratory infection, unspecified: Secondary | ICD-10-CM | POA: Diagnosis not present

## 2020-10-23 DIAGNOSIS — Z20822 Contact with and (suspected) exposure to covid-19: Secondary | ICD-10-CM | POA: Diagnosis not present

## 2020-10-23 DIAGNOSIS — N179 Acute kidney failure, unspecified: Secondary | ICD-10-CM | POA: Diagnosis not present

## 2020-10-23 DIAGNOSIS — I10 Essential (primary) hypertension: Secondary | ICD-10-CM | POA: Diagnosis not present

## 2020-10-23 DIAGNOSIS — I130 Hypertensive heart and chronic kidney disease with heart failure and stage 1 through stage 4 chronic kidney disease, or unspecified chronic kidney disease: Secondary | ICD-10-CM | POA: Diagnosis not present

## 2020-10-23 DIAGNOSIS — Z66 Do not resuscitate: Secondary | ICD-10-CM | POA: Diagnosis not present

## 2020-10-23 DIAGNOSIS — R072 Precordial pain: Secondary | ICD-10-CM | POA: Diagnosis not present

## 2020-10-23 DIAGNOSIS — I5032 Chronic diastolic (congestive) heart failure: Secondary | ICD-10-CM | POA: Diagnosis not present

## 2020-10-23 DIAGNOSIS — J449 Chronic obstructive pulmonary disease, unspecified: Secondary | ICD-10-CM | POA: Diagnosis not present

## 2020-10-23 DIAGNOSIS — Z7982 Long term (current) use of aspirin: Secondary | ICD-10-CM | POA: Diagnosis not present

## 2020-10-23 LAB — BASIC METABOLIC PANEL
Anion gap: 12 (ref 5–15)
BUN: 29 mg/dL — ABNORMAL HIGH (ref 8–23)
CO2: 21 mmol/L — ABNORMAL LOW (ref 22–32)
Calcium: 9.6 mg/dL (ref 8.9–10.3)
Chloride: 105 mmol/L (ref 98–111)
Creatinine, Ser: 1.26 mg/dL — ABNORMAL HIGH (ref 0.61–1.24)
GFR, Estimated: 56 mL/min — ABNORMAL LOW (ref >60)
Glucose, Bld: 93 mg/dL (ref 70–99)
Potassium: 4 mmol/L (ref 3.5–5.1)
Sodium: 138 mmol/L (ref 135–145)

## 2020-10-23 MED ORDER — HYDRALAZINE HCL 25 MG PO TABS
25.0000 mg | ORAL_TABLET | Freq: Three times a day (TID) | ORAL | Status: DC
  Administered 2020-10-23: 25 mg via ORAL
  Filled 2020-10-23: qty 1

## 2020-10-23 MED ORDER — ISOSORBIDE MONONITRATE ER 30 MG PO TB24: 30.0000 mg | ORAL_TABLET | Freq: Every day | ORAL | 1 refills | Status: DC

## 2020-10-23 MED ORDER — PREDNISONE 10 MG PO TABS: 10.0000 mg | ORAL_TABLET | Freq: Every day | ORAL | 0 refills | Status: DC

## 2020-10-23 MED ORDER — ISOSORBIDE MONONITRATE ER 30 MG PO TB24: 30.0000 mg | ORAL_TABLET | Freq: Every day | ORAL | Status: DC

## 2020-10-23 NOTE — Progress Notes (Signed)
Progress Note  Patient Name: Duane Park Date of Encounter: 10/23/2020  [facility] Cardiologist: No primary care provider on file.   Subjective   Patient feels better this morning. No chest pain at rest. Asking to go home today.  TTE with normal LVEF, G1DD, no significant valve disease  Inpatient Medications    Scheduled Meds: . diphenhydrAMINE  50 mg Oral QHS   And  . acetaminophen  1,000 mg Oral QHS  . aspirin EC  81 mg Oral Daily  . atorvastatin  80 mg Oral q1800  . cetirizine  10 mg Oral Daily  . enoxaparin (LOVENOX) injection  40 mg Subcutaneous Q24H  . fluticasone  2 spray Each Nare Daily  . multivitamin with minerals  1 tablet Oral Daily  . pantoprazole  80 mg Oral Daily  . predniSONE  10 mg Oral Q breakfast  . psyllium  1 packet Oral Daily  . traMADol  100 mg Oral Daily   Continuous Infusions: . lactated ringers Stopped (10/22/20 1407)   PRN Meds: acetaminophen, albuterol, hydrALAZINE, menthol-cetylpyridinium, ondansetron (ZOFRAN) IV   Vital Signs    Vitals:   10/22/20 1653 10/22/20 2029 10/23/20 0000 10/23/20 0338  BP: (!) 179/83 (!) 157/86 (!) 137/91 (!) 164/94  Pulse: 94 95 99 100  Resp: 18 18 19 20   Temp: 97.7 F (36.5 C) 98.2 F (36.8 C) 98 F (36.7 C) 98.3 F (36.8 C)  TempSrc: Oral Oral Oral Oral  SpO2: 98% 96% 94% 95%  Weight:    93 kg  Height:        Intake/Output Summary (Last 24 hours) at 10/23/2020 0742 Last data filed at 10/23/2020 0004 Gross per 24 hour  Intake 1006.57 ml  Output --  Net 1006.57 ml   Last 3 Weights 10/23/2020 10/22/2020 10/22/2020  Weight (lbs) 205 lb 0.4 oz 207 lb 1.6 oz 207 lb  Weight (kg) 93 kg 93.94 kg 93.895 kg      Telemetry    Sinus rhythm/sinus tach - Personally Reviewed  ECG    Sinus rhythm, 1st degree AVB, LAD - Personally Reviewed  Physical Exam   GEN: No acute distress.   Neck: No JVD Cardiac: Tachycardic, regular, no murmurs, rubs, or gallops.  Respiratory: Clear to auscultation  bilaterally. GI: Soft, nontender, non-distended  MS: No edema; No deformity. Neuro:  Nonfocal  Psych: Normal affect   Labs    High Sensitivity Troponin:   Recent Labs  Lab 10/22/20 0129 10/22/20 0336 10/22/20 0953 10/22/20 1200  TROPONINIHS 21* 36* 48* 53*      Chemistry Recent Labs  Lab 10/22/20 0129  NA 137  K 3.8  CL 104  CO2 19*  GLUCOSE 159*  BUN 45*  CREATININE 1.51*  CALCIUM 9.5  GFRNONAA 45*  ANIONGAP 14     Hematology Recent Labs  Lab 10/22/20 0129  WBC 10.6*  RBC 4.10*  HGB 15.3  HCT 43.1  MCV 105.1*  MCH 37.3*  MCHC 35.5  RDW 11.6  PLT 221    BNPNo results for input(s): BNP, PROBNP in the last 168 hours.   DDimer  Recent Labs  Lab 10/22/20 0129  DDIMER 0.35     Radiology    DG Chest Portable 1 View  Result Date: 10/22/2020 CLINICAL DATA:  Chest pain over the last day. EXAM: PORTABLE CHEST 1 VIEW COMPARISON:  12/12/2019 FINDINGS: Heart size is normal. Chronic atherosclerotic calcification and tortuosity of the aorta. Chronic elevation of right hemidiaphragm with mild chronic volume loss  at the right base. Chronic interstitial lung markings. No sign of active infiltrate, effusion or collapse. No pulmonary edema. IMPRESSION: No active disease. Chronic elevation of the right hemidiaphragm with chronic volume loss at the right base. Chronic interstitial markings. Aortic atherosclerosis and tortuosity. Electronically Signed   By: Nelson Chimes M.D.   On: 10/22/2020 02:21   ECHOCARDIOGRAM COMPLETE  Result Date: 10/22/2020    ECHOCARDIOGRAM REPORT   Patient Name:   Duane Park Date of Exam: 10/22/2020 Medical Rec #:  [id number]      Height:       71.0 in Accession #:    [id number]     Weight:       207.0 lb Date of Birth:  07-24-1932      BSA:          2.140 m Patient Age:    35 years       BP:           136/78 mmHg Patient Gender: M              HR:           77 bpm. Exam Location:  Inpatient Procedure: 2D Echo, Color Doppler and Cardiac Doppler  Indications:    R07.9* Chest pain, unspecified  History:        Patient has prior history of Echocardiogram examinations, most                 recent 04/13/2018. COPD; Risk Factors:Hypertension and                 Dyslipidemia.  Sonographer:    Raquel Sarna Senior RDCS Referring Phys: [id number] [facility] A Gasper Sells  Sonographer Comments: Very poor apical windows due to COPD. No on-axis window available. IMPRESSIONS  1. Left ventricular ejection fraction, by estimation, is 55 to 60%. The left ventricle has normal function. Left ventricular endocardial border not optimally defined to evaluate regional wall motion. There is mild concentric left ventricular hypertrophy. Left ventricular diastolic parameters are consistent with Grade I diastolic dysfunction (impaired relaxation).  2. Right ventricular systolic function is normal. The right ventricular size is normal. There is normal pulmonary artery systolic pressure.  3. The mitral valve is grossly normal. No evidence of mitral valve regurgitation. No evidence of mitral stenosis.  4. The aortic valve is calcified. There is moderate calcification of the aortic valve. Aortic valve regurgitation is not visualized. No aortic stenosis is present.  5. There is borderline dilatation of the ascending aorta, measuring 38 mm. Comparison(s): A prior study was performed on 04/13/2018. Study is more technically difficult: no wall motion abnormalities in views obtained; but no all walls are well assessed. Similar to prior. FINDINGS  Left Ventricle: Left ventricular ejection fraction, by estimation, is 55 to 60%. The left ventricle has normal function. Left ventricular endocardial border not optimally defined to evaluate regional wall motion. The left ventricular internal cavity size was normal in size. There is mild concentric left ventricular hypertrophy. Left ventricular diastolic parameters are consistent with Grade I diastolic dysfunction (impaired relaxation). Right Ventricle: The right  ventricular size is normal. No increase in right ventricular wall thickness. Right ventricular systolic function is normal. There is normal pulmonary artery systolic pressure. The tricuspid regurgitant velocity is 2.42 m/s, and  with an assumed right atrial pressure of 3 mmHg, the estimated right ventricular systolic pressure is 37.6 mmHg. Left Atrium: Left atrial size was normal in size. Right Atrium: Right atrial size was normal in size.  Pericardium: There is no evidence of pericardial effusion. Mitral Valve: The mitral valve is grossly normal. No evidence of mitral valve regurgitation. No evidence of mitral valve stenosis. Tricuspid Valve: The tricuspid valve is grossly normal. Tricuspid valve regurgitation is not demonstrated. Aortic Valve: The aortic valve is calcified. There is moderate calcification of the aortic valve. Aortic valve regurgitation is not visualized. No aortic stenosis is present. Pulmonic Valve: The pulmonic valve was not well visualized. Pulmonic valve regurgitation is not visualized. No evidence of pulmonic stenosis. Aorta: The aortic root is normal in size and structure. There is borderline dilatation of the ascending aorta, measuring 38 mm. IAS/Shunts: The atrial septum is grossly normal.  LEFT VENTRICLE PLAX 2D LVIDd:         4.10 cm  Diastology LVIDs:         2.50 cm  LV e' medial:    5.00 cm/s LV PW:         1.20 cm  LV E/e' medial:  14.7 LV IVS:        1.00 cm  LV e' lateral:   7.29 cm/s LVOT diam:     2.20 cm  LV E/e' lateral: 10.1 LV SV:         82 LV SV Index:   38 LVOT Area:     3.80 cm  RIGHT VENTRICLE RV S prime:     10.80 cm/s TAPSE (M-mode): 1.8 cm LEFT ATRIUM           Index       RIGHT ATRIUM           Index LA diam:      2.90 cm 1.36 cm/m  RA Area:     13.00 cm LA Vol (A4C): 40.5 ml 18.93 ml/m RA Volume:   27.60 ml  12.90 ml/m  AORTIC VALVE LVOT Vmax:   88.80 cm/s LVOT Vmean:  70.800 cm/s LVOT VTI:    0.216 m  AORTA Ao Root diam: 3.10 cm Ao Asc diam:  3.80 cm MITRAL  VALVE                TRICUSPID VALVE MV Area (PHT): 2.63 cm     TR Peak grad:   23.4 mmHg MV Decel Time: 288 msec     TR Vmax:        242.00 cm/s MV E velocity: 73.30 cm/s MV A velocity: 106.00 cm/s  SHUNTS MV E/A ratio:  0.69         Systemic VTI:  0.22 m                             Systemic Diam: 2.20 cm Rudean Haskell MD Electronically signed by Rudean Haskell MD Signature Date/Time: 10/22/2020/4:46:15 PM    Final     Cardiac Studies   TTE 10/22/20: IMPRESSIONS  1. Left ventricular ejection fraction, by estimation, is 55 to 60%. The  left ventricle has normal function. Left ventricular endocardial border  not optimally defined to evaluate regional wall motion. There is mild  concentric left ventricular hypertrophy. Left ventricular diastolic parameters are consistent with  Grade I diastolic dysfunction (impaired relaxation).  2. Right ventricular systolic function is normal. The right ventricular  size is normal. There is normal pulmonary artery systolic pressure.  3. The mitral valve is grossly normal. No evidence of mitral valve  regurgitation. No evidence of mitral stenosis.  4. The aortic valve is calcified. There is moderate calcification  of the  aortic valve. Aortic valve regurgitation is not visualized. No aortic  stenosis is present.  5. There is borderline dilatation of the ascending aorta, measuring 38  mm.   Comparison(s): A prior study was performed on 04/13/2018. Study is more  technically difficult: no wall motion abnormalities in views obtained; but  no all walls are well assessed. Similar to prior.   Echo 04/13/2018 LV EF: 55% -  60%  Study Conclusions   - Left ventricle: The cavity size was normal. Wall thickness was  increased in a pattern of mild LVH. Systolic function was normal.  The estimated ejection fraction was in the range of 55% to 60%.  Wall motion was normal; there were no regional wall motion  abnormalities. Doppler parameters  are consistent with abnormal  left ventricular relaxation (grade 1 diastolic dysfunction).   Patient Profile     87 y.o. male with history of hypertension, hyperlipidemia, CVA on 04/12/2018, COPD, and chronic elevated right hemidiaphragm who presented with URI symptoms as well as intermittent chest discomfort that has been ongoing for years for which cardiology has been consulted.  Assessment & Plan    #Chest Pain: #Concern for Stable Angina: Patient has history of exertional chest discomfort that has been ongoing for several years. ECG here without acute ischemic changes. Trop mildly elevated 21-->56.  Patient has several risk factors for CAD including HTN, HLD, prior CVA. TTE reassuringly with normal LVEF 55-60%, no WMA, G1DD. Given no significant pathology on TTE and patient is symptom free at rest, can pursue further ischemic work-up as out-patient. -TTE with normal EF, no significant WMA -Continue ASA 81mg  daily and atorvstatin 80mg  daily -Start imdur 30mg  daily -Will pursue ischemic work-up as out-patient -Discussed if his chest pain occurs at rest, he needs to go to the ER to be evaluated  #HTN: Usually well controlled on home olmesartan-HCTZ, which is being held due to AKI. -Resume home [place] tomorrow 2/14 given renal function improving -Started hydralazine for now due to elevated blood pressures; can stop on discharge  #History of CVA: Occurred 04/12/2018. -Continue ASA and statin as above  #URI symptoms: -Management per primary      For questions or updates, please contact [place] Please consult [email or web address] for contact info under        Signed, Freada Bergeron, MD  10/23/2020, 7:42 AM

## 2020-10-23 NOTE — Discharge Summary (Signed)
Physician Discharge Summary  Duane Park LXB:[id number] DOB: Oct 31, 1931 DOA: 10/22/2020  PCP: Greig Right, MD  Admit date: 10/22/2020 Discharge date: 10/23/2020  Admitted From: Home Disposition:  Home  Discharge Condition:Stable CODE STATUS:DNR Diet recommendation: Heart Healthy  Brief/Interim Summary:  HPI: Duane Park is a 87 y.o. male with medical history significant of CVA; HTN; HLD; and COPD presenting with chest pain.  He had a lot of nasal and chest congestion.  His left chest was quite painful - but that has calmed down.  He is having back pain now and his mouth is dry.  Congestion for a couple of days.  Chest pain also started a couple of days ago.  It got better with NTG x 4.  + SOB.  Remote h/o stress test, has never had a cath.  Hospital Course:  Patient's hospital course remained stable.  Chest pain resolved after admission. Chest pain was atypical.  EKG did not show ischemic changes.  Troponin is minimally elevated.   Respiratory status is currently stable.  Respiratory virus panel came out to be negative.  Chest imaging did not show any pneumonia. Echocardiogram showed ejection fraction of 55 to 41%, grade 1 diastolic dysfunction, no wall motion abnormality. He had mild AKI on presentation which resolved.  Gentle IV fluids. Remains mildly hypertensive.  Can resume his home medication on discharge. Cardiology cleared him for discharge and recommended outpatient follow-up for his stress test.  He has been started on Imdur. Continue aspirin and Lipitor at home. For his upper respiratory symptoms, he can continue prednisone 10 mg daily for the next few days.  I have recommended him to follow-up with his PCP in a week. Patient is medically stable for discharge home today.  Cardiology will call for follow-up up appointment.    Discharge Diagnoses:  Principal Problem:   Chest pain Active Problems:   Dyslipidemia   Essential hypertension   Diastolic dysfunction    URI (upper respiratory infection)   AKI (acute kidney injury) ([place])   DNR (do not resuscitate)    Discharge Instructions  Discharge Instructions    Diet - low sodium heart healthy   Complete by: As directed    Discharge instructions   Complete by: As directed    1)Please take prescribed medications as instructed 2)Follow up with your PCP in a week. Do a BMP test in a week 3)You will be called by cardiology for follow-up appointment as an outpatient. 4)Monitor your BP at home   Increase activity slowly   Complete by: As directed      Allergies as of 10/23/2020   No Known Allergies     Medication List    TAKE these medications   acetaminophen 325 MG tablet Commonly known as: TYLENOL Take 1-2 tablets (325-650 mg total) by mouth every 4 (four) hours as needed for mild pain.   albuterol 108 (90 Base) MCG/ACT inhaler Commonly known as: VENTOLIN HFA Inhale 1-2 puffs into the lungs every 6 (six) hours as needed for wheezing or shortness of breath.   albuterol (2.5 MG/3ML) 0.083% NEBU 3 mL, albuterol (5 MG/ML) 0.5% NEBU 0.5 mL Inhale 5 mg into the lungs daily as needed (shortness of breath).   aspirin 81 MG EC tablet Take 1 tablet (81 mg total) by mouth daily.   atorvastatin 80 MG tablet Commonly known as: LIPITOR Take 1 tablet (80 mg total) by mouth daily at 6 PM.   celecoxib 200 MG capsule Commonly known as: CELEBREX Take 200 mg  by mouth at bedtime.   diphenhydramine-acetaminophen 25-500 MG Tabs tablet Commonly known as: TYLENOL PM Take 2 tablets by mouth daily.   fluticasone 50 MCG/ACT nasal spray Commonly known as: FLONASE Place 1 spray into both nostrils in the morning and at bedtime.   isosorbide mononitrate 30 MG 24 hr tablet Commonly known as: IMDUR Take 1 tablet (30 mg total) by mouth daily.   metroNIDAZOLE 0.75 % cream Commonly known as: METROCREAM Apply 1 application topically 2 (two) times daily. On the head.   multivitamin tablet Take 1 tablet by  mouth daily.   olmesartan-hydrochlorothiazide 40-12.5 MG tablet Commonly known as: BENICAR HCT Take 1 tablet by mouth daily.   pantoprazole 40 MG tablet Commonly known as: PROTONIX Take 2 tablets (80 mg total) by mouth daily.   predniSONE 10 MG tablet Commonly known as: DELTASONE Take 1 tablet (10 mg total) by mouth daily with breakfast.   psyllium 58.6 % powder Commonly known as: METAMUCIL Take 1 packet by mouth daily.   traMADol 50 MG tablet Commonly known as: ULTRAM Take 100 mg by mouth daily.   ZYRTEC PO Take 10 mg by mouth.       Follow-up Information    Minus Breeding, MD Follow up.   Specialty: Cardiology Why: office scheduler will contact you to arrange follow up, please give [place] a call if you do not hear from our scheduler in 3 business days.  Contact information: [address] [place] [address] [phone]        Greig Right, MD. Schedule an appointment as soon as possible for a visit in 1 week(s).   Specialty: Family Medicine Contact information: [place] [address] [phone]              No Known Allergies  Consultations:  Cardiology   Procedures/Studies: DG Chest Portable 1 View  Result Date: 10/22/2020 CLINICAL DATA:  Chest pain over the last day. EXAM: PORTABLE CHEST 1 VIEW COMPARISON:  12/12/2019 FINDINGS: Heart size is normal. Chronic atherosclerotic calcification and tortuosity of the aorta. Chronic elevation of right hemidiaphragm with mild chronic volume loss at the right base. Chronic interstitial lung markings. No sign of active infiltrate, effusion or collapse. No pulmonary edema. IMPRESSION: No active disease. Chronic elevation of the right hemidiaphragm with chronic volume loss at the right base. Chronic interstitial markings. Aortic atherosclerosis and tortuosity. Electronically Signed   By: Nelson Chimes M.D.   On: 10/22/2020 02:21   ECHOCARDIOGRAM COMPLETE  Result Date:  10/22/2020    ECHOCARDIOGRAM REPORT   Patient Name:   Duane Park Date of Exam: 10/22/2020 Medical Rec #:  [id number]      Height:       71.0 in Accession #:    [id number]     Weight:       207.0 lb Date of Birth:  07-01-1932      BSA:          2.140 m Patient Age:    87 years       BP:           136/78 mmHg Patient Gender: M              HR:           77 bpm. Exam Location:  Inpatient Procedure: 2D Echo, Color Doppler and Cardiac Doppler Indications:    R07.9* Chest pain, unspecified  History:        Patient has prior  history of Echocardiogram examinations, most                 recent 04/13/2018. COPD; Risk Factors:Hypertension and                 Dyslipidemia.  Sonographer:    Raquel Sarna Senior RDCS Referring Phys: [id number] [facility] [place] [facility]  Sonographer Comments: Very poor apical windows due to COPD. No on-axis window available. IMPRESSIONS  1. Left ventricular ejection fraction, by estimation, is 55 to 60%. The left ventricle has normal function. Left ventricular endocardial border not optimally defined to evaluate regional wall motion. There is mild concentric left ventricular hypertrophy. Left ventricular diastolic parameters are consistent with Grade I diastolic dysfunction (impaired relaxation).  2. Right ventricular systolic function is normal. The right ventricular size is normal. There is normal pulmonary artery systolic pressure.  3. The mitral valve is grossly normal. No evidence of mitral valve regurgitation. No evidence of mitral stenosis.  4. The aortic valve is calcified. There is moderate calcification of the aortic valve. Aortic valve regurgitation is not visualized. No aortic stenosis is present.  5. There is borderline dilatation of the ascending aorta, measuring 38 mm. Comparison(s): A prior study was performed on 04/13/2018. Study is more technically difficult: no wall motion abnormalities in views obtained; but no all walls are well assessed. Similar to prior. FINDINGS  Left Ventricle: Left  ventricular ejection fraction, by estimation, is 55 to 60%. The left ventricle has normal function. Left ventricular endocardial border not optimally defined to evaluate regional wall motion. The left ventricular internal cavity size was normal in size. There is mild concentric left ventricular hypertrophy. Left ventricular diastolic parameters are consistent with Grade I diastolic dysfunction (impaired relaxation). Right Ventricle: The right ventricular size is normal. No increase in right ventricular wall thickness. Right ventricular systolic function is normal. There is normal pulmonary artery systolic pressure. The tricuspid regurgitant velocity is 2.42 m/s, and  with an assumed right atrial pressure of 3 mmHg, the estimated right ventricular systolic pressure is 75.1 mmHg. Left Atrium: Left atrial size was normal in size. Right Atrium: Right atrial size was normal in size. Pericardium: There is no evidence of pericardial effusion. Mitral Valve: The mitral valve is grossly normal. No evidence of mitral valve regurgitation. No evidence of mitral valve stenosis. Tricuspid Valve: The tricuspid valve is grossly normal. Tricuspid valve regurgitation is not demonstrated. Aortic Valve: The aortic valve is calcified. There is moderate calcification of the aortic valve. Aortic valve regurgitation is not visualized. No aortic stenosis is present. Pulmonic Valve: The pulmonic valve was not well visualized. Pulmonic valve regurgitation is not visualized. No evidence of pulmonic stenosis. Aorta: The aortic root is normal in size and structure. There is borderline dilatation of the ascending aorta, measuring 38 mm. IAS/Shunts: The atrial septum is grossly normal.  LEFT VENTRICLE PLAX 2D LVIDd:         4.10 cm  Diastology LVIDs:         2.50 cm  LV e' medial:    5.00 cm/s LV PW:         1.20 cm  LV E/e' medial:  14.7 LV IVS:        1.00 cm  LV e' lateral:   7.29 cm/s LVOT diam:     2.20 cm  LV E/e' lateral: 10.1 LV SV:          82 LV SV Index:   38 LVOT Area:     3.80 cm  RIGHT  VENTRICLE RV S prime:     10.80 cm/s TAPSE (M-mode): 1.8 cm LEFT ATRIUM           Index       RIGHT ATRIUM           Index LA diam:      2.90 cm 1.36 cm/m  RA Area:     13.00 cm LA Vol (A4C): 40.5 ml 18.93 ml/m RA Volume:   27.60 ml  12.90 ml/m  AORTIC VALVE LVOT Vmax:   88.80 cm/s LVOT Vmean:  70.800 cm/s LVOT VTI:    0.216 m  AORTA Ao Root diam: 3.10 cm Ao Asc diam:  3.80 cm MITRAL VALVE                TRICUSPID VALVE MV Area (PHT): 2.63 cm     TR Peak grad:   23.4 mmHg MV Decel Time: 288 msec     TR Vmax:        242.00 cm/s MV E velocity: 73.30 cm/s MV A velocity: 106.00 cm/s  SHUNTS MV E/A ratio:  0.69         Systemic VTI:  0.22 m                             Systemic Diam: 2.20 cm Rudean Haskell MD Electronically signed by Rudean Haskell MD Signature Date/Time: 10/22/2020/4:46:15 PM    Final        Subjective: Patient seen and examined the bedside this morning.  Hemodynamically stable for discharge today.  Discharge Exam: Vitals:   10/23/20 0338 10/23/20 0900  BP: (!) 164/94 (!) 159/82  Pulse: 100   Resp: 20   Temp: 98.3 F (36.8 C)   SpO2: 95%    Vitals:   10/22/20 2029 10/23/20 0000 10/23/20 0338 10/23/20 0900  BP: (!) 157/86 (!) 137/91 (!) 164/94 (!) 159/82  Pulse: 95 99 100   Resp: 18 19 20    Temp: 98.2 F (36.8 C) 98 F (36.7 C) 98.3 F (36.8 C)   TempSrc: Oral Oral Oral   SpO2: 96% 94% 95%   Weight:   93 kg   Height:        General: Pt is alert, awake, not in acute distress Cardiovascular: RRR, S1/S2 +, no rubs, no gallops Respiratory: CTA bilaterally, no wheezing, no rhonchi Abdominal: Soft, NT, ND, bowel sounds + Extremities: no edema, no cyanosis    The results of significant diagnostics from this hospitalization (including imaging, microbiology, ancillary and laboratory) are listed below for reference.     Microbiology: Recent Results (from the past 240 hour(s))  Resp Panel by RT-PCR (Flu  A&B, Covid) Nasopharyngeal Swab     Status: None   Collection Time: 10/22/20  3:46 AM   Specimen: Nasopharyngeal Swab; Nasopharyngeal(NP) swabs in vial transport medium  Result Value Ref Range Status   SARS Coronavirus 2 by RT PCR NEGATIVE NEGATIVE Final    Comment: (NOTE) SARS-CoV-2 target nucleic acids are NOT DETECTED.  The SARS-CoV-2 RNA is generally detectable in upper respiratory specimens during the acute phase of infection. The lowest concentration of SARS-CoV-2 viral copies this assay can detect is 138 copies/mL. A negative result does not preclude SARS-Cov-2 infection and should not be used as the sole basis for treatment or other patient management decisions. A negative result may occur with  improper specimen collection/handling, submission of specimen other than nasopharyngeal swab, presence of viral mutation(s) within the areas targeted  by this assay, and inadequate number of viral copies(<138 copies/mL). A negative result must be combined with clinical observations, patient history, and epidemiological information. The expected result is Negative.  Fact Sheet for Patients:  EntrepreneurPulse.com.au  Fact Sheet for Healthcare Providers:  IncredibleEmployment.be  This test is no t yet approved or cleared by the [place] FDA and  has been authorized for detection and/or diagnosis of SARS-CoV-2 by FDA under an Emergency Use Authorization (EUA). This EUA will remain  in effect (meaning this test can be used) for the duration of the COVID-19 declaration under Section 564(b)(1) of the Act, 21 U.S.C.section 360bbb-3(b)(1), unless the authorization is terminated  or revoked sooner.       Influenza A by PCR NEGATIVE NEGATIVE Final   Influenza B by PCR NEGATIVE NEGATIVE Final    Comment: (NOTE) The Xpert Xpress SARS-CoV-2/FLU/RSV plus assay is intended as an aid in the diagnosis of influenza from Nasopharyngeal swab specimens  and should not be used as a sole basis for treatment. Nasal washings and aspirates are unacceptable for Xpert Xpress SARS-CoV-2/FLU/RSV testing.  Fact Sheet for Patients: EntrepreneurPulse.com.au  Fact Sheet for Healthcare Providers: IncredibleEmployment.be  This test is not yet approved or cleared by the [place] FDA and has been authorized for detection and/or diagnosis of SARS-CoV-2 by FDA under an Emergency Use Authorization (EUA). This EUA will remain in effect (meaning this test can be used) for the duration of the COVID-19 declaration under Section 564(b)(1) of the Act, 21 U.S.C. section 360bbb-3(b)(1), unless the authorization is terminated or revoked.  Performed at [facility], [place] [address] [place], [place] [address]   Group A Strep by PCR     Status: None   Collection Time: 10/22/20  9:27 AM   Specimen: Nasopharyngeal Swab; Sterile Swab  Result Value Ref Range Status   Group A Strep by PCR NOT DETECTED NOT DETECTED Final    Comment: Performed at [facility], [place] [address] [place], [place] [address]  Respiratory (~20 pathogens) panel by PCR     Status: None   Collection Time: 10/22/20  1:54 PM   Specimen: Nasopharyngeal Swab; Respiratory  Result Value Ref Range Status   Adenovirus NOT DETECTED NOT DETECTED Final   Coronavirus 229E NOT DETECTED NOT DETECTED Final    Comment: (NOTE) The Coronavirus on the Respiratory Panel, DOES NOT test for the novel  Coronavirus (2019 nCoV)    Coronavirus HKU1 NOT DETECTED NOT DETECTED Final   Coronavirus NL63 NOT DETECTED NOT DETECTED Final   Coronavirus OC43 NOT DETECTED NOT DETECTED Final   Metapneumovirus NOT DETECTED NOT DETECTED Final   Rhinovirus / Enterovirus NOT DETECTED NOT DETECTED Final   Influenza A NOT DETECTED NOT DETECTED Final   Influenza B NOT DETECTED NOT DETECTED Final   Parainfluenza Virus 1 NOT DETECTED NOT DETECTED Final   Parainfluenza Virus 2 NOT  DETECTED NOT DETECTED Final   Parainfluenza Virus 3 NOT DETECTED NOT DETECTED Final   Parainfluenza Virus 4 NOT DETECTED NOT DETECTED Final   Respiratory Syncytial Virus NOT DETECTED NOT DETECTED Final   Bordetella pertussis NOT DETECTED NOT DETECTED Final   Bordetella Parapertussis NOT DETECTED NOT DETECTED Final   Chlamydophila pneumoniae NOT DETECTED NOT DETECTED Final   Mycoplasma pneumoniae NOT DETECTED NOT DETECTED Final    Comment: Performed at [facility], [place]. [address], [place], [place] [address]     Labs: BNP (last 3 results) No results for input(s): BNP in the last  8760 hours. Basic Metabolic Panel: Recent Labs  Lab 10/22/20 0129 10/23/20 0802  NA 137 138  K 3.8 4.0  CL 104 105  CO2 19* 21*  GLUCOSE 159* 93  BUN 45* 29*  CREATININE 1.51* 1.26*  CALCIUM 9.5 9.6   Liver Function Tests: No results for input(s): AST, ALT, ALKPHOS, BILITOT, PROT, ALBUMIN in the last 168 hours. No results for input(s): LIPASE, AMYLASE in the last 168 hours. No results for input(s): AMMONIA in the last 168 hours. CBC: Recent Labs  Lab 10/22/20 0129  WBC 10.6*  HGB 15.3  HCT 43.1  MCV 105.1*  PLT 221   Cardiac Enzymes: No results for input(s): CKTOTAL, CKMB, CKMBINDEX, TROPONINI in the last 168 hours. BNP: Invalid input(s): POCBNP CBG: No results for input(s): GLUCAP in the last 168 hours. D-Dimer Recent Labs    10/22/20 0129  DDIMER 0.35   Hgb A1c Recent Labs    10/22/20 0954  HGBA1C 5.4   Lipid Profile Recent Labs    10/22/20 0953  CHOL 110  HDL 34*  LDLCALC 62  TRIG 68  CHOLHDL 3.2   Thyroid function studies Recent Labs    10/22/20 0954  TSH 1.422   Anemia work up No results for input(s): VITAMINB12, FOLATE, FERRITIN, TIBC, IRON, RETICCTPCT in the last 72 hours. Urinalysis    Component Value Date/Time   COLORURINE YELLOW 12/12/2019 1200   APPEARANCEUR CLEAR 12/12/2019 1200   LABSPEC 1.014 12/12/2019 1200   LABSPEC 1.005 09/23/2006 1446    PHURINE 7.0 12/12/2019 1200   GLUCOSEU NEGATIVE 12/12/2019 1200   HGBUR NEGATIVE 12/12/2019 1200   BILIRUBINUR NEGATIVE 12/12/2019 1200   BILIRUBINUR Negative 09/23/2006 1446   KETONESUR NEGATIVE 12/12/2019 1200   PROTEINUR NEGATIVE 12/12/2019 1200   NITRITE NEGATIVE 12/12/2019 1200   LEUKOCYTESUR NEGATIVE 12/12/2019 1200   LEUKOCYTESUR Negative 09/23/2006 1446   Sepsis Labs Invalid input(s): PROCALCITONIN,  WBC,  LACTICIDVEN Microbiology Recent Results (from the past 240 hour(s))  Resp Panel by RT-PCR (Flu A&B, Covid) Nasopharyngeal Swab     Status: None   Collection Time: 10/22/20  3:46 AM   Specimen: Nasopharyngeal Swab; Nasopharyngeal(NP) swabs in vial transport medium  Result Value Ref Range Status   SARS Coronavirus 2 by RT PCR NEGATIVE NEGATIVE Final    Comment: (NOTE) SARS-CoV-2 target nucleic acids are NOT DETECTED.  The SARS-CoV-2 RNA is generally detectable in upper respiratory specimens during the acute phase of infection. The lowest concentration of SARS-CoV-2 viral copies this assay can detect is 138 copies/mL. A negative result does not preclude SARS-Cov-2 infection and should not be used as the sole basis for treatment or other patient management decisions. A negative result may occur with  improper specimen collection/handling, submission of specimen other than nasopharyngeal swab, presence of viral mutation(s) within the areas targeted by this assay, and inadequate number of viral copies(<138 copies/mL). A negative result must be combined with clinical observations, patient history, and epidemiological information. The expected result is Negative.  Fact Sheet for Patients:  EntrepreneurPulse.com.au  Fact Sheet for Healthcare Providers:  IncredibleEmployment.be  This test is no t yet approved or cleared by the [place] FDA and  has been authorized for detection and/or diagnosis of SARS-CoV-2 by FDA under an  Emergency Use Authorization (EUA). This EUA will remain  in effect (meaning this test can be used) for the duration of the COVID-19 declaration under Section 564(b)(1) of the Act, 21 U.S.C.section 360bbb-3(b)(1), unless the authorization is terminated  or revoked sooner.  Influenza A by PCR NEGATIVE NEGATIVE Final   Influenza B by PCR NEGATIVE NEGATIVE Final    Comment: (NOTE) The Xpert Xpress SARS-CoV-2/FLU/RSV plus assay is intended as an aid in the diagnosis of influenza from Nasopharyngeal swab specimens and should not be used as a sole basis for treatment. Nasal washings and aspirates are unacceptable for Xpert Xpress SARS-CoV-2/FLU/RSV testing.  Fact Sheet for Patients: EntrepreneurPulse.com.au  Fact Sheet for Healthcare Providers: IncredibleEmployment.be  This test is not yet approved or cleared by the [place] FDA and has been authorized for detection and/or diagnosis of SARS-CoV-2 by FDA under an Emergency Use Authorization (EUA). This EUA will remain in effect (meaning this test can be used) for the duration of the COVID-19 declaration under Section 564(b)(1) of the Act, 21 U.S.C. section 360bbb-3(b)(1), unless the authorization is terminated or revoked.  Performed at [facility], [place] [address], [place], [place] [address]   Group A Strep by PCR     Status: None   Collection Time: 10/22/20  9:27 AM   Specimen: Nasopharyngeal Swab; Sterile Swab  Result Value Ref Range Status   Group A Strep by PCR NOT DETECTED NOT DETECTED Final    Comment: Performed at [facility], [place] [address] [place], [place] [address]  Respiratory (~20 pathogens) panel by PCR     Status: None   Collection Time: 10/22/20  1:54 PM   Specimen: Nasopharyngeal Swab; Respiratory  Result Value Ref Range Status   Adenovirus NOT DETECTED NOT DETECTED Final   Coronavirus 229E NOT DETECTED NOT DETECTED Final    Comment: (NOTE) The  Coronavirus on the Respiratory Panel, DOES NOT test for the novel  Coronavirus (2019 nCoV)    Coronavirus HKU1 NOT DETECTED NOT DETECTED Final   Coronavirus NL63 NOT DETECTED NOT DETECTED Final   Coronavirus OC43 NOT DETECTED NOT DETECTED Final   Metapneumovirus NOT DETECTED NOT DETECTED Final   Rhinovirus / Enterovirus NOT DETECTED NOT DETECTED Final   Influenza A NOT DETECTED NOT DETECTED Final   Influenza B NOT DETECTED NOT DETECTED Final   Parainfluenza Virus 1 NOT DETECTED NOT DETECTED Final   Parainfluenza Virus 2 NOT DETECTED NOT DETECTED Final   Parainfluenza Virus 3 NOT DETECTED NOT DETECTED Final   Parainfluenza Virus 4 NOT DETECTED NOT DETECTED Final   Respiratory Syncytial Virus NOT DETECTED NOT DETECTED Final   Bordetella pertussis NOT DETECTED NOT DETECTED Final   Bordetella Parapertussis NOT DETECTED NOT DETECTED Final   Chlamydophila pneumoniae NOT DETECTED NOT DETECTED Final   Mycoplasma pneumoniae NOT DETECTED NOT DETECTED Final    Comment: Performed at [facility], [place]. [address], [place], [place] [address]    Please note: You were cared for by a hospitalist during your hospital stay. Once you are discharged, your primary care physician will handle any further medical issues. Please note that NO REFILLS for any discharge medications will be authorized once you are discharged, as it is imperative that you return to your primary care physician (or establish a relationship with a primary care physician if you do not have one) for your post hospital discharge needs so that they can reassess your need for medications and monitor your lab values.    Time coordinating discharge: 40 minutes  SIGNED:   Shelly Coss, MD  [facility] 10/23/2020, 11:04 AM Pager [phone]  If 7PM-7AM, please contact night-coverage [email or web address] Password [email or web address]

## 2020-10-27 DIAGNOSIS — R0789 Other chest pain: Secondary | ICD-10-CM | POA: Diagnosis not present

## 2020-10-27 DIAGNOSIS — J0100 Acute maxillary sinusitis, unspecified: Secondary | ICD-10-CM | POA: Diagnosis not present

## 2020-11-07 DIAGNOSIS — J069 Acute upper respiratory infection, unspecified: Secondary | ICD-10-CM | POA: Diagnosis not present

## 2020-11-11 ENCOUNTER — Encounter: Payer: Self-pay | Admitting: Internal Medicine

## 2020-11-11 ENCOUNTER — Ambulatory Visit (INDEPENDENT_AMBULATORY_CARE_PROVIDER_SITE_OTHER): Payer: Medicare Other | Admitting: Internal Medicine

## 2020-11-11 ENCOUNTER — Other Ambulatory Visit: Payer: Self-pay

## 2020-11-11 VITALS — BP 122/78 | HR 103 | Ht 71.0 in | Wt 203.0 lb

## 2020-11-11 DIAGNOSIS — R072 Precordial pain: Secondary | ICD-10-CM

## 2020-11-11 DIAGNOSIS — I700 Atherosclerosis of aorta: Secondary | ICD-10-CM | POA: Diagnosis not present

## 2020-11-11 DIAGNOSIS — I10 Essential (primary) hypertension: Secondary | ICD-10-CM

## 2020-11-11 LAB — BASIC METABOLIC PANEL
BUN/Creatinine Ratio: 38 — ABNORMAL HIGH (ref 10–24)
BUN: 57 mg/dL — ABNORMAL HIGH (ref 8–27)
CO2: 21 mmol/L (ref 20–29)
Calcium: 9.6 mg/dL (ref 8.6–10.2)
Chloride: 102 mmol/L (ref 96–106)
Creatinine, Ser: 1.51 mg/dL — ABNORMAL HIGH (ref 0.76–1.27)
Glucose: 103 mg/dL — ABNORMAL HIGH (ref 65–99)
Potassium: 4.1 mmol/L (ref 3.5–5.2)
Sodium: 139 mmol/L (ref 134–144)
eGFR: 45 mL/min/{1.73_m2} — ABNORMAL LOW (ref >59)

## 2020-11-11 NOTE — Progress Notes (Signed)
Cardiology Office Note:    Date:  11/11/2020   ID:  Duane, Park 09-Nov-1931, MRN [id number]  PCP:  Greig Right, MD   [place]  Cardiologist:  No primary care provider on file.  Advanced Practice Provider:  No care team member to display Electrophysiologist:  None       CC: Follow up for CP eval  History of Present Illness:    Duane Park is a 87 y.o. male with a hx of prior stroke, HTN, HLD, COPD who presents after ED evaluation for chest pain.  Saw Patient 10/22/20 for chest pain in the setting of AKI.  In eval has normal echo (difficult study for WMAs) and no return pain 10/23/20.  Discharged with follow up care.  Patient notes that he is doing well .  Since last visit notes improvement with cholesterol .  Relevant interval testing or therapy include echocardiogram (done); has been treated for a sinus infection.  There are no interval hospital/ED visit.    No chest pain or pressure.  Notes with  Walking, had a little bit of tightness.   Notes some DOE walking up from the mailbox and no PND/Orthopnea.  .  No palpitations or syncope.  Ambulatory blood pressure SBP 120-130.  Past Medical History:  Diagnosis Date  . Chronic back pain   . COPD (chronic obstructive pulmonary disease) ([place])   . DNR (do not resuscitate) 10/22/2020  . Hypercholesteremia   . Hypertension   . Stroke [facility])     Past Surgical History:  Procedure Laterality Date  . BACK SURGERY    . JOINT REPLACEMENT     arthroscopic surgery rt side    Current Medications: Current Meds  Medication Sig  . acetaminophen (TYLENOL) 325 MG tablet Take 1-2 tablets (325-650 mg total) by mouth every 4 (four) hours as needed for mild pain.  Marland Kitchen albuterol (2.5 MG/3ML) 0.083% NEBU 3 mL, albuterol (5 MG/ML) 0.5% NEBU 0.5 mL Inhale 5 mg into the lungs daily as needed (shortness of breath).  Marland Kitchen albuterol (VENTOLIN HFA) 108 (90 Base) MCG/ACT inhaler Inhale 1-2 puffs into the lungs every 6 (six)  hours as needed for wheezing or shortness of breath.  Marland Kitchen aspirin EC 81 MG EC tablet Take 1 tablet (81 mg total) by mouth daily.  Marland Kitchen atorvastatin (LIPITOR) 80 MG tablet Take 1 tablet (80 mg total) by mouth daily at 6 PM.  . celecoxib (CELEBREX) 200 MG capsule Take 200 mg by mouth at bedtime.  . Cetirizine HCl (ZYRTEC PO) Take 10 mg by mouth.  . diphenhydramine-acetaminophen (TYLENOL PM) 25-500 MG TABS tablet Take 2 tablets by mouth daily.  . isosorbide mononitrate (IMDUR) 30 MG 24 hr tablet Take 1 tablet (30 mg total) by mouth daily.  . Loratadine (CLARITIN PO) Take by mouth daily.  . metroNIDAZOLE (METROCREAM) 0.75 % cream Apply 1 application topically 2 (two) times daily. On the head.  . Multiple Vitamin (MULTIVITAMIN) tablet Take 1 tablet by mouth daily.  Marland Kitchen olmesartan-hydrochlorothiazide (BENICAR HCT) 40-12.5 MG tablet Take 1 tablet by mouth daily.  . pantoprazole (PROTONIX) 40 MG tablet Take 2 tablets (80 mg total) by mouth daily.  . psyllium (METAMUCIL) 58.6 % powder Take 1 packet by mouth daily.  . traMADol (ULTRAM) 50 MG tablet Take 100 mg by mouth daily.     Allergies:   Patient has no known allergies.   Social History   Socioeconomic History  . Marital status: Married    Spouse  name: Not on file  . Number of children: Not on file  . Years of education: Not on file  . Highest education level: Not on file  Occupational History  . Not on file  Tobacco Use  . Smoking status: Never Smoker  . Smokeless tobacco: Never Used  Substance and Sexual Activity  . Alcohol use: Not Currently  . Drug use: Never  . Sexual activity: Not on file  Other Topics Concern  . Not on file  Social History Narrative  . Not on file   Social Determinants of Health   Financial Resource Strain: Not on file  Food Insecurity: Not on file  Transportation Needs: Not on file  Physical Activity: Not on file  Stress: Not on file  Social Connections: Not on file    Social:  Comes with  Daughter  Family History: The patient's family history includes Heart disease in his father; Lung disease in his mother.  ROS:   Please see the history of present illness.     All other systems reviewed and are negative.  EKGs/Labs/Other Studies Reviewed:    The following studies were reviewed today:  EKG:   10/24/20: Sinus Tach rate 106 RAD and LAFB  Transthoracic Echocardiogram: Date: 10/22/20 Results: 1. Left ventricular ejection fraction, by estimation, is 55 to 60%. The  left ventricle has normal function. Left ventricular endocardial border  not optimally defined to evaluate regional wall motion. There is mild  concentric left ventricular  hypertrophy. Left ventricular diastolic parameters are consistent with  Grade I diastolic dysfunction (impaired relaxation).  2. Right ventricular systolic function is normal. The right ventricular  size is normal. There is normal pulmonary artery systolic pressure.  3. The mitral valve is grossly normal. No evidence of mitral valve  regurgitation. No evidence of mitral stenosis.  4. The aortic valve is calcified. There is moderate calcification of the  aortic valve. Aortic valve regurgitation is not visualized. No aortic  stenosis is present.  5. There is borderline dilatation of the ascending aorta, measuring 38  mm.   Comparison(s): A prior study was performed on 04/13/2018. Study is more  technically difficult: no wall motion abnormalities in views obtained; but  no all walls are well assessed. Similar to prior.   Recent Labs: 12/12/2019: ALT 18 10/22/2020: Hemoglobin 15.3; Platelets 221; TSH 1.422 10/23/2020: BUN 29; Creatinine, Ser 1.26; Potassium 4.0; Sodium 138  Recent Lipid Panel    Component Value Date/Time   CHOL 110 10/22/2020 0953   TRIG 68 10/22/2020 0953   HDL 34 (L) 10/22/2020 0953   CHOLHDL 3.2 10/22/2020 0953   VLDL 14 10/22/2020 0953   LDLCALC 62 10/22/2020 0953     Risk Assessment/Calculations:      N/A  Physical Exam:    VS:  BP 122/78   Pulse (!) 103   Ht 5\' 11"  (1.803 m)   Wt 203 lb (92.1 kg)   SpO2 97%   BMI 28.31 kg/m     Wt Readings from Last 3 Encounters:  11/11/20 203 lb (92.1 kg)  10/23/20 205 lb 0.4 oz (93 kg)  12/12/19 210 lb (95.3 kg)     GEN:  Well nourished, well developed in no acute distress HEENT: Normal NECK: No JVD; No carotid bruits LYMPHATICS: No lymphadenopathy CARDIAC: RRR, no murmurs, rubs, gallops RESPIRATORY:  Clear to auscultation without rales, wheezing or rhonchi  ABDOMEN: Soft, non-tender, non-distended MUSCULOSKELETAL:  No edema; No deformity  SKIN: Warm and dry NEUROLOGIC:  Alert and oriented  x 3 PSYCHIATRIC:  Normal affect   ASSESSMENT:    1. Precordial pain   2. Essential hypertension   3. Aortic atherosclerosis (HCC)    PLAN:    In order of problems listed above:  Chest Pain Syndrome COPD AKI (recent) - The patient presents with possible cardiac chest pain - Continue ASA 81 mg QD, current statin - Continue Imdur; can re-evaluation post ischemic work up - will check BMP today:  Based on this we talk about both CCTA or Nuclear medicine stress test for ischemic work up  Aortic Atherosclerosis Hyperlipidemia  -LDL goal less than 70 -continue current statin - gave education on dietary changes   Essential Hypertension - ambulatory blood pressure at goal, will continue ambulatory BP monitoring; gave education on how to perform ambulatory blood pressure monitoring including the frequency and technique; goal ambulatory blood pressure < 135/85 on average - continue home medications  Summer follow up unless new symptoms or abnormal test results warranting change in plan  Would be reasonable for  APP Follow up   Shared Decision Making/Informed Consent The risks [chest pain, shortness of breath, cardiac arrhythmias, dizziness, blood pressure fluctuations, myocardial infarction, stroke/transient ischemic attack, nausea,  vomiting, allergic reaction, radiation exposure, metallic taste sensation and life-threatening complications (estimated to be 1 in 10,000)], benefits (risk stratification, diagnosing coronary artery disease, treatment guidance) and alternatives of a nuclear stress test were discussed in detail with Mr. Comp and he agrees to proceed.       Medication Adjustments/Labs and Tests Ordered: Current medicines are reviewed at length with the patient today.  Concerns regarding medicines are outlined above.  Orders Placed This Encounter  Procedures  . Basic metabolic panel   No orders of the defined types were placed in this encounter.   Patient Instructions  Medication Instructions:  Your physician recommends that you continue on your current medications as directed. Please refer to the Current Medication list given to you today.  *If you need a refill on your cardiac medications before your next appointment, please call your pharmacy*   Lab Work: TODAY: BMP If you have labs (blood work) drawn today and your tests are completely normal, you will receive your results only by: Marland Kitchen MyChart Message (if you have MyChart) OR . A paper copy in the mail If you have any lab test that is abnormal or we need to change your treatment, we will call you to review the results.   Testing/Procedures: NONE at this time, testing to be determined by results of blood work   Follow-Up: At [facility], you and your health needs are our priority.  As part of our continuing mission to provide you with exceptional heart care, we have created designated Provider Care Teams.  These Care Teams include your primary Cardiologist (physician) and Advanced Practice Providers (APPs -  Physician Assistants and Nurse Practitioners) who all work together to provide you with the care you need, when you need it.  We recommend signing up for the patient portal called "MyChart".  Sign up information is provided on this After  Visit Summary.  MyChart is used to connect with patients for Virtual Visits (Telemedicine).  Patients are able to view lab/test results, encounter notes, upcoming appointments, etc.  Non-urgent messages can be sent to your provider as well.   To learn more about what you can do with MyChart, go to [email or web address].    Your next appointment:   3-5 month(s)  The format for your next appointment:  In Person  Provider:   You may see Gasper Sells, MD or one of the following Advanced Practice Providers on your designated Care Team:    Melina Copa, PA-C  Ermalinda Barrios, PA-C          Signed, Werner Lean, MD  11/11/2020 11:18 AM    [place]

## 2020-11-11 NOTE — Patient Instructions (Signed)
Medication Instructions:  Your physician recommends that you continue on your current medications as directed. Please refer to the Current Medication list given to you today.  *If you need a refill on your cardiac medications before your next appointment, please call your pharmacy*   Lab Work: TODAY: BMP If you have labs (blood work) drawn today and your tests are completely normal, you will receive your results only by: Marland Kitchen MyChart Message (if you have MyChart) OR . A paper copy in the mail If you have any lab test that is abnormal or we need to change your treatment, we will call you to review the results.   Testing/Procedures: NONE at this time, testing to be determined by results of blood work   Follow-Up: At [facility], you and your health needs are our priority.  As part of our continuing mission to provide you with exceptional heart care, we have created designated Provider Care Teams.  These Care Teams include your primary Cardiologist (physician) and Advanced Practice Providers (APPs -  Physician Assistants and Nurse Practitioners) who all work together to provide you with the care you need, when you need it.  We recommend signing up for the patient portal called "MyChart".  Sign up information is provided on this After Visit Summary.  MyChart is used to connect with patients for Virtual Visits (Telemedicine).  Patients are able to view lab/test results, encounter notes, upcoming appointments, etc.  Non-urgent messages can be sent to your provider as well.   To learn more about what you can do with MyChart, go to [email or web address].    Your next appointment:   3-5 month(s)  The format for your next appointment:   In Person  Provider:   You may see Gasper Sells, MD or one of the following Advanced Practice Providers on your designated Care Team:    Melina Copa, PA-C  Ermalinda Barrios, PA-C

## 2020-11-14 DIAGNOSIS — I10 Essential (primary) hypertension: Secondary | ICD-10-CM | POA: Diagnosis not present

## 2020-11-14 DIAGNOSIS — E782 Mixed hyperlipidemia: Secondary | ICD-10-CM | POA: Diagnosis not present

## 2020-11-14 DIAGNOSIS — Z79899 Other long term (current) drug therapy: Secondary | ICD-10-CM | POA: Diagnosis not present

## 2020-11-14 DIAGNOSIS — Z6829 Body mass index (BMI) 29.0-29.9, adult: Secondary | ICD-10-CM | POA: Diagnosis not present

## 2020-11-14 DIAGNOSIS — Z8546 Personal history of malignant neoplasm of prostate: Secondary | ICD-10-CM | POA: Diagnosis not present

## 2020-11-14 DIAGNOSIS — M150 Primary generalized (osteo)arthritis: Secondary | ICD-10-CM | POA: Diagnosis not present

## 2020-11-14 DIAGNOSIS — Z8673 Personal history of transient ischemic attack (TIA), and cerebral infarction without residual deficits: Secondary | ICD-10-CM | POA: Diagnosis not present

## 2020-11-14 DIAGNOSIS — I700 Atherosclerosis of aorta: Secondary | ICD-10-CM | POA: Diagnosis not present

## 2020-11-17 ENCOUNTER — Telehealth: Payer: Self-pay

## 2020-11-17 DIAGNOSIS — R072 Precordial pain: Secondary | ICD-10-CM

## 2020-11-17 NOTE — Progress Notes (Signed)
Instructions for Duane Park for daughter to review and go over with patient.

## 2020-11-17 NOTE — Telephone Encounter (Signed)
-----   Message from Werner Lean, MD sent at 11/15/2020  2:00 PM EST ----- Results: Increase in creatinine, but similar to 3 weeks ago and several months ago Plan: Plan for Sharalyn Ink, MD

## 2020-11-17 NOTE — Telephone Encounter (Signed)
Spoke with patient daughter Earlie Server [facility] per [facility]).  Reviewed results she verbalizes understanding.  Told her of Dr. Oralia Rud recommendation for a Lexiscan stress test.  She is agreeable to this told that scheduler will call to set up appointment. Will place instructions for test on My Chart.

## 2020-11-23 ENCOUNTER — Telehealth (HOSPITAL_COMMUNITY): Payer: Self-pay | Admitting: *Deleted

## 2020-11-23 NOTE — Addendum Note (Signed)
Addended by: Rudean Haskell A on: 11/23/2020 12:24 PM   Modules accepted: Orders

## 2020-11-23 NOTE — Telephone Encounter (Signed)
Left message on voicemail per DPR in reference to upcoming appointment scheduled on 11/30/20 at 44 with detailed instructions given per Myocardial Perfusion Study Information Sheet for the test. LM to arrive 15 minutes early, and that it is imperative to arrive on time for appointment to keep from having the test rescheduled. If you need to cancel or reschedule your appointment, please call the office within 24 hours of your appointment. Failure to do so may result in a cancellation of your appointment, and a $50 no show fee. Phone number given for call back for any questions. Onix Jumper, Ranae Palms

## 2020-11-30 ENCOUNTER — Other Ambulatory Visit: Payer: Self-pay

## 2020-11-30 ENCOUNTER — Ambulatory Visit (HOSPITAL_COMMUNITY): Payer: Medicare Other | Attending: Internal Medicine

## 2020-11-30 DIAGNOSIS — R072 Precordial pain: Secondary | ICD-10-CM | POA: Diagnosis not present

## 2020-11-30 LAB — MYOCARDIAL PERFUSION IMAGING
LV dias vol: 51 mL (ref 62–150)
LV sys vol: 17 mL
Peak HR: 112 {beats}/min
Rest HR: 88 {beats}/min
SDS: 2
SRS: 2
SSS: 5
TID: 0.97

## 2020-11-30 MED ORDER — TECHNETIUM TC 99M TETROFOSMIN IV KIT
32.3000 | PACK | Freq: Once | INTRAVENOUS | Status: AC | PRN
  Administered 2020-11-30: 32.3 via INTRAVENOUS
  Filled 2020-11-30: qty 33

## 2020-11-30 MED ORDER — TECHNETIUM TC 99M TETROFOSMIN IV KIT
10.3000 | PACK | Freq: Once | INTRAVENOUS | Status: AC | PRN
  Administered 2020-11-30: 10.3 via INTRAVENOUS
  Filled 2020-11-30: qty 11

## 2020-11-30 MED ORDER — REGADENOSON 0.4 MG/5ML IV SOLN
0.4000 mg | Freq: Once | INTRAVENOUS | Status: AC
  Administered 2020-11-30: 0.4 mg via INTRAVENOUS

## 2020-12-05 DIAGNOSIS — L820 Inflamed seborrheic keratosis: Secondary | ICD-10-CM | POA: Diagnosis not present

## 2020-12-05 DIAGNOSIS — L570 Actinic keratosis: Secondary | ICD-10-CM | POA: Diagnosis not present

## 2020-12-05 DIAGNOSIS — L578 Other skin changes due to chronic exposure to nonionizing radiation: Secondary | ICD-10-CM | POA: Diagnosis not present

## 2020-12-05 DIAGNOSIS — L821 Other seborrheic keratosis: Secondary | ICD-10-CM | POA: Diagnosis not present

## 2020-12-19 ENCOUNTER — Telehealth: Payer: Self-pay

## 2020-12-19 MED ORDER — ISOSORBIDE MONONITRATE ER 30 MG PO TB24: 30.0000 mg | ORAL_TABLET | Freq: Every day | ORAL | 0 refills | Status: DC

## 2020-12-19 NOTE — Telephone Encounter (Signed)
Called spoke with daughter. Rescheduled next OV with Dr. Gasper Sells to 03/15/21 at 2:40PM.

## 2020-12-19 NOTE — Telephone Encounter (Signed)
Refill sent in for isosorbide mononitrate 30 mg QD.  90 day supply sent in with no refills.

## 2020-12-21 DIAGNOSIS — R0781 Pleurodynia: Secondary | ICD-10-CM | POA: Diagnosis not present

## 2020-12-21 DIAGNOSIS — R7401 Elevation of levels of liver transaminase levels: Secondary | ICD-10-CM | POA: Diagnosis not present

## 2020-12-21 DIAGNOSIS — R0789 Other chest pain: Secondary | ICD-10-CM | POA: Diagnosis not present

## 2020-12-21 DIAGNOSIS — R0782 Intercostal pain: Secondary | ICD-10-CM | POA: Diagnosis not present

## 2021-01-04 DIAGNOSIS — H532 Diplopia: Secondary | ICD-10-CM | POA: Diagnosis not present

## 2021-01-11 DIAGNOSIS — J0100 Acute maxillary sinusitis, unspecified: Secondary | ICD-10-CM | POA: Diagnosis not present

## 2021-01-19 DIAGNOSIS — I700 Atherosclerosis of aorta: Secondary | ICD-10-CM | POA: Diagnosis not present

## 2021-01-19 DIAGNOSIS — J209 Acute bronchitis, unspecified: Secondary | ICD-10-CM | POA: Diagnosis not present

## 2021-02-05 DIAGNOSIS — R0600 Dyspnea, unspecified: Secondary | ICD-10-CM | POA: Diagnosis not present

## 2021-02-05 DIAGNOSIS — J209 Acute bronchitis, unspecified: Secondary | ICD-10-CM | POA: Diagnosis not present

## 2021-02-05 DIAGNOSIS — R051 Acute cough: Secondary | ICD-10-CM | POA: Diagnosis not present

## 2021-02-13 ENCOUNTER — Ambulatory Visit: Payer: Medicare Other | Admitting: Internal Medicine

## 2021-02-13 DIAGNOSIS — J159 Unspecified bacterial pneumonia: Secondary | ICD-10-CM | POA: Diagnosis not present

## 2021-02-13 DIAGNOSIS — I10 Essential (primary) hypertension: Secondary | ICD-10-CM | POA: Diagnosis not present

## 2021-02-13 DIAGNOSIS — Z6829 Body mass index (BMI) 29.0-29.9, adult: Secondary | ICD-10-CM | POA: Diagnosis not present

## 2021-02-13 DIAGNOSIS — I700 Atherosclerosis of aorta: Secondary | ICD-10-CM | POA: Diagnosis not present

## 2021-02-13 DIAGNOSIS — R7301 Impaired fasting glucose: Secondary | ICD-10-CM | POA: Diagnosis not present

## 2021-02-21 DIAGNOSIS — J159 Unspecified bacterial pneumonia: Secondary | ICD-10-CM | POA: Diagnosis not present

## 2021-03-01 DIAGNOSIS — Z8546 Personal history of malignant neoplasm of prostate: Secondary | ICD-10-CM | POA: Diagnosis not present

## 2021-03-08 DIAGNOSIS — R059 Cough, unspecified: Secondary | ICD-10-CM | POA: Diagnosis not present

## 2021-03-08 DIAGNOSIS — J439 Emphysema, unspecified: Secondary | ICD-10-CM | POA: Diagnosis not present

## 2021-03-08 DIAGNOSIS — Z8546 Personal history of malignant neoplasm of prostate: Secondary | ICD-10-CM | POA: Diagnosis not present

## 2021-03-08 DIAGNOSIS — N401 Enlarged prostate with lower urinary tract symptoms: Secondary | ICD-10-CM | POA: Diagnosis not present

## 2021-03-08 DIAGNOSIS — N200 Calculus of kidney: Secondary | ICD-10-CM | POA: Diagnosis not present

## 2021-03-08 DIAGNOSIS — R351 Nocturia: Secondary | ICD-10-CM | POA: Diagnosis not present

## 2021-03-08 DIAGNOSIS — R0602 Shortness of breath: Secondary | ICD-10-CM | POA: Diagnosis not present

## 2021-03-08 DIAGNOSIS — J22 Unspecified acute lower respiratory infection: Secondary | ICD-10-CM | POA: Diagnosis not present

## 2021-03-08 DIAGNOSIS — J069 Acute upper respiratory infection, unspecified: Secondary | ICD-10-CM | POA: Diagnosis not present

## 2021-03-15 ENCOUNTER — Encounter: Payer: Self-pay | Admitting: Internal Medicine

## 2021-03-15 ENCOUNTER — Ambulatory Visit (INDEPENDENT_AMBULATORY_CARE_PROVIDER_SITE_OTHER): Payer: Medicare Other | Admitting: Internal Medicine

## 2021-03-15 ENCOUNTER — Other Ambulatory Visit: Payer: Self-pay

## 2021-03-15 VITALS — BP 140/74 | HR 95 | Ht 70.0 in | Wt 199.0 lb

## 2021-03-15 DIAGNOSIS — N1831 Chronic kidney disease, stage 3a: Secondary | ICD-10-CM

## 2021-03-15 DIAGNOSIS — I10 Essential (primary) hypertension: Secondary | ICD-10-CM | POA: Diagnosis not present

## 2021-03-15 DIAGNOSIS — E785 Hyperlipidemia, unspecified: Secondary | ICD-10-CM | POA: Diagnosis not present

## 2021-03-15 DIAGNOSIS — J449 Chronic obstructive pulmonary disease, unspecified: Secondary | ICD-10-CM | POA: Diagnosis not present

## 2021-03-15 DIAGNOSIS — I700 Atherosclerosis of aorta: Secondary | ICD-10-CM

## 2021-03-15 NOTE — Patient Instructions (Signed)
Medication Instructions:  Your physician recommends that you continue on your current medications as directed. Please refer to the Current Medication list given to you today.  *If you need a refill on your cardiac medications before your next appointment, please call your pharmacy*   Lab Work: NONE If you have labs (blood work) drawn today and your tests are completely normal, you will receive your results only by: [place] (if you have MyChart) OR A paper copy in the mail If you have any lab test that is abnormal or we need to change your treatment, we will call you to review the results.   Testing/Procedures: NONE   Follow-Up: At [facility], you and your health needs are our priority.  As part of our continuing mission to provide you with exceptional heart care, we have created designated Provider Care Teams.  These Care Teams include your primary Cardiologist (physician) and Advanced Practice Providers (APPs -  Physician Assistants and Nurse Practitioners) who all work together to provide you with the care you need, when you need it.  We recommend signing up for the patient portal called "MyChart".  Sign up information is provided on this After Visit Summary.  MyChart is used to connect with patients for Virtual Visits (Telemedicine).  Patients are able to view lab/test results, encounter notes, upcoming appointments, etc.  Non-urgent messages can be sent to your provider as well.   To learn more about what you can do with MyChart, go to [email or web address].    Your next appointment:   12 month(s)  The format for your next appointment:   In Person  Provider:   You may see Werner Lean, MD or one of the following Advanced Practice Providers on your designated Care Team:   Melina Copa, PA-C Ermalinda Barrios, PA-C

## 2021-03-15 NOTE — Progress Notes (Signed)
Cardiology Office Note:    Date:  03/15/2021   ID:  HARRIS PENTON, DOB 1931/11/29, MRN [id number]  PCP:  Greig Right, MD   [place]  Cardiologist:  Werner Lean, MD  Advanced Practice Provider:  No care team member to display Electrophysiologist:  None      CC: Follow up for CP eval  History of Present Illness:    Duane Park is a 87 y.o. male with a hx of prior stroke, HTN, HLD, COPD who presents after ED evaluation for chest pain.  Saw Patient 10/22/20 for chest pain in the setting of AKI.  In eval has normal echo (difficult study for WMAs) and no return pain 10/23/20.  Discharged with follow up care.  Last seen 11/23/20.  In interim of this visit, patient negative stress test.  Seen 03/15/21.  Patient notes that he is doing well.  Since last visit notes having recent changes.  Relevant interval testing or therapy include having a PNA and just finished.  There are no interval hospital/ED visit.    No chest pain or pressure.  Stable breathing outside of the PNA and no PND/Orthopnea.  No weight gain but notes leg swelling since he stopped compression stockings (his wife is having some health issues and she used to help him with them).  No palpitations or syncope.  Ambulatory blood pressure 130/80.  Past Medical History:  Diagnosis Date   Chronic back pain    COPD (chronic obstructive pulmonary disease) ([place])    DNR (do not resuscitate) 10/22/2020   Hypercholesteremia    Hypertension    Stroke [facility])     Past Surgical History:  Procedure Laterality Date   BACK SURGERY     JOINT REPLACEMENT     arthroscopic surgery rt side    Current Medications: Current Meds  Medication Sig   acetaminophen (TYLENOL) 325 MG tablet Take 1-2 tablets (325-650 mg total) by mouth every 4 (four) hours as needed for mild pain.   albuterol (2.5 MG/3ML) 0.083% NEBU 3 mL, albuterol (5 MG/ML) 0.5% NEBU 0.5 mL Inhale 5 mg into the lungs daily as needed (shortness of  breath).   albuterol (VENTOLIN HFA) 108 (90 Base) MCG/ACT inhaler Inhale 1-2 puffs into the lungs every 6 (six) hours as needed for wheezing or shortness of breath.   aspirin EC 81 MG EC tablet Take 1 tablet (81 mg total) by mouth daily.   atorvastatin (LIPITOR) 80 MG tablet Take 1 tablet (80 mg total) by mouth daily at 6 PM.   celecoxib (CELEBREX) 200 MG capsule Take 200 mg by mouth at bedtime.   Cetirizine HCl (ZYRTEC PO) Take 10 mg by mouth.   diphenhydramine-acetaminophen (TYLENOL PM) 25-500 MG TABS tablet Take 2 tablets by mouth daily.   fluticasone (FLONASE) 50 MCG/ACT nasal spray Place 2 sprays into both nostrils as needed.   isosorbide mononitrate (IMDUR) 30 MG 24 hr tablet Take 1 tablet (30 mg total) by mouth daily.   Loratadine (CLARITIN PO) Take by mouth daily.   metroNIDAZOLE (METROCREAM) 0.75 % cream Apply 1 application topically 2 (two) times daily. On the head.   Multiple Vitamin (MULTIVITAMIN) tablet Take 1 tablet by mouth daily.   olmesartan-hydrochlorothiazide (BENICAR HCT) 40-12.5 MG tablet Take 1 tablet by mouth daily.   pantoprazole (PROTONIX) 40 MG tablet Take 2 tablets (80 mg total) by mouth daily.   psyllium (METAMUCIL) 58.6 % powder Take 1 packet by mouth daily.   traMADol (ULTRAM) 50 MG  tablet Take 100 mg by mouth daily.     Allergies:   Patient has no known allergies.   Social History   Socioeconomic History   Marital status: Married    Spouse name: Not on file   Number of children: Not on file   Years of education: Not on file   Highest education level: Not on file  Occupational History   Not on file  Tobacco Use   Smoking status: Never   Smokeless tobacco: Never  Substance and Sexual Activity   Alcohol use: Not Currently   Drug use: Never   Sexual activity: Not on file  Other Topics Concern   Not on file  Social History Narrative   Not on file   Social Determinants of Health   Financial Resource Strain: Not on file  Food Insecurity: Not on  file  Transportation Needs: Not on file  Physical Activity: Not on file  Stress: Not on file  Social Connections: Not on file    Social:  Comes with Daughter, wife has some health problems   Family History: The patient's family history includes Heart disease in his father; Lung disease in his mother.  ROS:   Please see the history of present illness.     All other systems reviewed and are negative.  EKGs/Labs/Other Studies Reviewed:    The following studies were reviewed today:  EKG:   10/24/20: Sinus Tach rate 106 RAD and LAFB  Transthoracic Echocardiogram: Date: 10/22/20 Results:  1. Left ventricular ejection fraction, by estimation, is 55 to 60%. The  left ventricle has normal function. Left ventricular endocardial border  not optimally defined to evaluate regional wall motion. There is mild  concentric left ventricular  hypertrophy. Left ventricular diastolic parameters are consistent with  Grade I diastolic dysfunction (impaired relaxation).   2. Right ventricular systolic function is normal. The right ventricular  size is normal. There is normal pulmonary artery systolic pressure.   3. The mitral valve is grossly normal. No evidence of mitral valve  regurgitation. No evidence of mitral stenosis.   4. The aortic valve is calcified. There is moderate calcification of the  aortic valve. Aortic valve regurgitation is not visualized. No aortic  stenosis is present.   5. There is borderline dilatation of the ascending aorta, measuring 38  mm.   Comparison(s): A prior study was performed on 04/13/2018. Study is more  technically difficult: no wall motion abnormalities in views obtained; but  not all walls are well assessed. Similar to prior.   NM Stress Testing: Date: 11/30/20 Results: Nuclear stress EF: 66%. The left ventricular ejection fraction is hyperdynamic (>65%). There was no ST segment deviation noted during stress. This is a low risk study. No evidence of  ischemia or previous infarction. The study is normal.    Recent Labs: 10/22/2020: Hemoglobin 15.3; Platelets 221; TSH 1.422 11/11/2020: BUN 57; Creatinine, Ser 1.51; Potassium 4.1; Sodium 139  Recent Lipid Panel    Component Value Date/Time   CHOL 110 10/22/2020 0953   TRIG 68 10/22/2020 0953   HDL 34 (L) 10/22/2020 0953   CHOLHDL 3.2 10/22/2020 0953   VLDL 14 10/22/2020 0953   LDLCALC 62 10/22/2020 0953     Risk Assessment/Calculations:     N/A  Physical Exam:    VS:  BP 140/74   Pulse 95   Ht 5\' 10"  (1.778 m)   Wt 90.3 kg   SpO2 97%   BMI 28.55 kg/m     Wt Readings  from Last 3 Encounters:  03/15/21 90.3 kg  11/30/20 92.1 kg  11/11/20 92.1 kg     GEN:  Well nourished, well developed in no acute distress HEENT: Normal NECK: No JVD; No carotid bruits  LYMPHATICS: No lymphadenopathy CARDIAC: RRR, no murmurs, rubs, gallops RESPIRATORY:  Clear to auscultation without rales, wheezing or rhonchi  ABDOMEN: Soft, non-tender, non-distended MUSCULOSKELETAL:  +2 edema; No deformity  SKIN: Warm and dry NEUROLOGIC:  Alert and oriented x 3 PSYCHIATRIC:  Normal affect   ASSESSMENT:    1. Essential hypertension   2. Stage 3a chronic kidney disease ([place])   3. Aortic atherosclerosis ([place])   4. Hyperlipidemia, unspecified hyperlipidemia type   5. Chronic obstructive pulmonary disease, unspecified COPD type ([place])     PLAN:    In order of problems listed above:  HTN Aortic atherosclerosis and HLD CKD Stage IIIA COPD with new PNA - LDL goal < 70 with atorvastatin 80 mg PO Daily - continue ASA 81 mg PO daily - CP resolved on Imdur w 30 mg PO Daily with no SE - presently to continue olmesartan and HCTZ 40-12.5 mg; discussed amb BP monitoring; if this increases we will stop HCTZ, start lasix, and closely monitor kidney function - discussed compression stocking use and zip up version  One year follow up unless new symptoms or abnormal test results warranting change in  plan Would be reasonable for  APP Follow up     Medication Adjustments/Labs and Tests Ordered: Current medicines are reviewed at length with the patient today.  Concerns regarding medicines are outlined above.  No orders of the defined types were placed in this encounter.  No orders of the defined types were placed in this encounter.   Patient Instructions  Medication Instructions:  Your physician recommends that you continue on your current medications as directed. Please refer to the Current Medication list given to you today.  *If you need a refill on your cardiac medications before your next appointment, please call your pharmacy*   Lab Work: NONE If you have labs (blood work) drawn today and your tests are completely normal, you will receive your results only by: [place] (if you have MyChart) OR A paper copy in the mail If you have any lab test that is abnormal or we need to change your treatment, we will call you to review the results.   Testing/Procedures: NONE   Follow-Up: At [facility], you and your health needs are our priority.  As part of our continuing mission to provide you with exceptional heart care, we have created designated Provider Care Teams.  These Care Teams include your primary Cardiologist (physician) and Advanced Practice Providers (APPs -  Physician Assistants and Nurse Practitioners) who all work together to provide you with the care you need, when you need it.  We recommend signing up for the patient portal called "MyChart".  Sign up information is provided on this After Visit Summary.  MyChart is used to connect with patients for Virtual Visits (Telemedicine).  Patients are able to view lab/test results, encounter notes, upcoming appointments, etc.  Non-urgent messages can be sent to your provider as well.   To learn more about what you can do with MyChart, go to [email or web address].    Your next appointment:   12 month(s)  The  format for your next appointment:   In Person  Provider:   You may see Werner Lean, MD or one of the following Advanced Practice Providers on your  designated Care Team:   Melina Copa, PA-C Ermalinda Barrios, PA-C        Signed, Werner Lean, MD  03/15/2021 3:56 PM    [place]

## 2021-03-18 ENCOUNTER — Other Ambulatory Visit: Payer: Self-pay | Admitting: Internal Medicine

## 2021-03-20 DIAGNOSIS — J22 Unspecified acute lower respiratory infection: Secondary | ICD-10-CM | POA: Diagnosis not present

## 2021-03-20 DIAGNOSIS — D6869 Other thrombophilia: Secondary | ICD-10-CM | POA: Diagnosis not present

## 2021-04-04 DIAGNOSIS — I700 Atherosclerosis of aorta: Secondary | ICD-10-CM | POA: Diagnosis not present

## 2021-04-04 DIAGNOSIS — J189 Pneumonia, unspecified organism: Secondary | ICD-10-CM | POA: Diagnosis not present

## 2021-04-04 DIAGNOSIS — R911 Solitary pulmonary nodule: Secondary | ICD-10-CM | POA: Diagnosis not present

## 2021-04-04 DIAGNOSIS — R053 Chronic cough: Secondary | ICD-10-CM | POA: Diagnosis not present

## 2021-05-09 DIAGNOSIS — M48062 Spinal stenosis, lumbar region with neurogenic claudication: Secondary | ICD-10-CM | POA: Diagnosis not present

## 2021-05-09 DIAGNOSIS — M5136 Other intervertebral disc degeneration, lumbar region: Secondary | ICD-10-CM | POA: Diagnosis not present

## 2021-05-09 DIAGNOSIS — M5416 Radiculopathy, lumbar region: Secondary | ICD-10-CM | POA: Diagnosis not present

## 2021-05-09 DIAGNOSIS — Z981 Arthrodesis status: Secondary | ICD-10-CM | POA: Diagnosis not present

## 2021-05-12 DIAGNOSIS — I10 Essential (primary) hypertension: Secondary | ICD-10-CM | POA: Diagnosis not present

## 2021-05-12 DIAGNOSIS — R053 Chronic cough: Secondary | ICD-10-CM | POA: Diagnosis not present

## 2021-05-17 DIAGNOSIS — I10 Essential (primary) hypertension: Secondary | ICD-10-CM | POA: Diagnosis not present

## 2021-05-17 DIAGNOSIS — M150 Primary generalized (osteo)arthritis: Secondary | ICD-10-CM | POA: Diagnosis not present

## 2021-05-17 DIAGNOSIS — Z6841 Body Mass Index (BMI) 40.0 and over, adult: Secondary | ICD-10-CM | POA: Diagnosis not present

## 2021-05-17 DIAGNOSIS — Z8546 Personal history of malignant neoplasm of prostate: Secondary | ICD-10-CM | POA: Diagnosis not present

## 2021-05-17 DIAGNOSIS — I639 Cerebral infarction, unspecified: Secondary | ICD-10-CM | POA: Diagnosis not present

## 2021-05-17 DIAGNOSIS — I700 Atherosclerosis of aorta: Secondary | ICD-10-CM | POA: Diagnosis not present

## 2021-05-17 DIAGNOSIS — Z0000 Encounter for general adult medical examination without abnormal findings: Secondary | ICD-10-CM | POA: Diagnosis not present

## 2021-05-17 DIAGNOSIS — K219 Gastro-esophageal reflux disease without esophagitis: Secondary | ICD-10-CM | POA: Diagnosis not present

## 2021-06-13 ENCOUNTER — Other Ambulatory Visit: Payer: Self-pay

## 2021-06-13 ENCOUNTER — Encounter: Payer: Self-pay | Admitting: Emergency Medicine

## 2021-06-13 ENCOUNTER — Ambulatory Visit (INDEPENDENT_AMBULATORY_CARE_PROVIDER_SITE_OTHER): Payer: Medicare Other | Admitting: Emergency Medicine

## 2021-06-13 DIAGNOSIS — J849 Interstitial pulmonary disease, unspecified: Secondary | ICD-10-CM

## 2021-06-13 DIAGNOSIS — R053 Chronic cough: Secondary | ICD-10-CM

## 2021-06-13 NOTE — Assessment & Plan Note (Addendum)
Chronic cough since February and actually has had similar bouts over the years.  No history of asthma, no smoking although he may have had some significant secondhand smoke exposure.  Never has been documented to have COPD by pulmonary function testing.  Efforts have been made to treat chronic rhinitis and GERD.  He was also started empirically on Breztri, albuterol.  His CT scan of the chest from [facility] was reviewed, showed some peripheral basilar interstitial disease, question chronic aspiration, question other cause for mild ILD.  He does not have any significant exposure history and it is unclear to me whether the interstitial changes correlate with his cough but possible.  We will try to continue treating his chronic rhinitis, change his fluticasone nasal spray to every day.  Get a sputum sample.  I will space out his pantoprazole since he is more symptomatic at night when he is supine.  Stop the [facility] as it may be acting as an upper airway irritant.  Check pulmonary function testing to assess for airflow obstruction, lower airways disease   Change your pantoprazole (Protonix) to 40 mg twice a day.  Take this 1 hour around food. Continue loratadine and Zyrtec as you have been taking them Start taking your fluticasone nasal spray, 2 sprays each nostril once daily every day.  Do not take it right before bedtime. Stop [name] You can keep albuterol available use 2 puffs if needed for shortness of breath, chest tightness, spells of coughing. We will obtain a sputum sample to send for culture. We will perform pulmonary function testing in next office visit. We reviewed your CT scan of the chest from [facility] today.  There are some areas of interstitial inflammation and scarring of unclear cause.  Uncertain whether this relates to your cough but we will continue to follow, decide whether any other work-up would be helpful. Follow with Dr. Lamonte Sakai next available with full pulmonary function  testing on the same day.

## 2021-06-13 NOTE — Progress Notes (Signed)
Subjective:    Patient ID: Duane Park, male    DOB: Nov 25, 1931, 87 y.o.   MRN: [id number]  HPI 87 year old man, never smoker, with history of hypertension, hypercholesterolemia, stroke.  Carries a diagnosis of COPD, question chronic asthma although I do not see pulmonary function testing. He started to have cough and increased congestion in Feb, unclear inciting event.   He is on Breztri that was added recently 05/12/21, has albuterol but had not been using frequently, has not helped. The [name] may have helped him some, a bit less cough.  On loratadine + zyrtec, fluticasone nasal spray as needed, saline spray as needed, pantoprazole 80 mg daily, minimal breakthrough  He has been treated with pred, abx a few times. May have slowed down with this but never stopped.  Bothers him the most at night when he lays down. Wakes him from sleep. Productive of yellow/ green. For the last few weeks happening less during the day. Never hemoptysis. No aspiration sx. No cough with food or drink.    Review of Systems As per HPI  Past Medical History:  Diagnosis Date   Chronic back pain    COPD (chronic obstructive pulmonary disease) ([place])    DNR (do not resuscitate) 10/22/2020   Hypercholesteremia    Hypertension    Stroke ([place])      Family History  Problem Relation Age of Onset   Lung disease Mother        never smoker   Heart disease Father      Social History   Socioeconomic History   Marital status: Married    Spouse name: Not on file   Number of children: Not on file   Years of education: Not on file   Highest education level: Not on file  Occupational History   Not on file  Tobacco Use   Smoking status: Never   Smokeless tobacco: Never  Substance and Sexual Activity   Alcohol use: Not Currently   Drug use: Never   Sexual activity: Not on file  Other Topics Concern   Not on file  Social History Narrative   Not on file   Social Determinants of Health   Financial  Resource Strain: Not on file  Food Insecurity: Not on file  Transportation Needs: Not on file  Physical Activity: Not on file  Stress: Not on file  Social Connections: Not on file  Intimate Partner Violence: Not on file    Has worked as a Tree surgeon Has lived in [place], [place] and [place]   No Known Allergies   Outpatient Medications Prior to Visit  Medication Sig Dispense Refill   acetaminophen (TYLENOL) 325 MG tablet Take 1-2 tablets (325-650 mg total) by mouth every 4 (four) hours as needed for mild pain.     albuterol (2.5 MG/3ML) 0.083% NEBU 3 mL, albuterol (5 MG/ML) 0.5% NEBU 0.5 mL Inhale 5 mg into the lungs daily as needed (shortness of breath).     albuterol (VENTOLIN HFA) 108 (90 Base) MCG/ACT inhaler Inhale 1-2 puffs into the lungs every 6 (six) hours as needed for wheezing or shortness of breath.     amLODipine (NORVASC) 5 MG tablet Take 5 mg by mouth daily.     aspirin EC 81 MG EC tablet Take 1 tablet (81 mg total) by mouth daily. 30 tablet 3   atorvastatin (LIPITOR) 80 MG tablet Take 1 tablet (80 mg total) by mouth daily at 6 PM. 30 tablet 3   BREZTRI AEROSPHERE  160-9-4.8 MCG/ACT AERO SMARTSIG:2 Puff(s) By Mouth Morning-Evening     celecoxib (CELEBREX) 200 MG capsule Take 200 mg by mouth at bedtime.     Cetirizine HCl (ZYRTEC PO) Take 10 mg by mouth.     diphenhydramine-acetaminophen (TYLENOL PM) 25-500 MG TABS tablet Take 2 tablets by mouth daily.     fluticasone (FLONASE) 50 MCG/ACT nasal spray Place 2 sprays into both nostrils as needed.     Loratadine (CLARITIN PO) Take by mouth daily.     metroNIDAZOLE (METROCREAM) 0.75 % cream Apply 1 application topically 2 (two) times daily. On the head.     Multiple Vitamin (MULTIVITAMIN) tablet Take 1 tablet by mouth daily.     olmesartan-hydrochlorothiazide (BENICAR HCT) 40-12.5 MG tablet Take 1 tablet by mouth daily.     pantoprazole (PROTONIX) 40 MG tablet Take 2 tablets (80 mg total) by mouth daily. 90 tablet 3   psyllium (METAMUCIL)  58.6 % powder Take 1 packet by mouth daily.     traMADol (ULTRAM) 50 MG tablet Take 100 mg by mouth daily.     isosorbide mononitrate (IMDUR) 30 MG 24 hr tablet TAKE 1 TABLET(30 MG) BY MOUTH DAILY (Patient not taking: Reported on 06/13/2021) 90 tablet 3   No facility-administered medications prior to visit.         Objective:   Physical Exam  Vitals:   06/13/21 1625  BP: 118/68  Pulse: 92  Temp: 98.7 F (37.1 C)  TempSrc: Oral  SpO2: 96%  Weight: 196 lb (88.9 kg)  Height: 5\' 10"  (1.778 m)    Gen: Pleasant, well-nourished elderly man, in no distress,  normal affect  ENT: No lesions,  mouth clear,  oropharynx clear, no postnasal drip  Neck: No JVD, no stridor  Lungs: No use of accessory muscles, no crackles or wheezing on normal respiration, no wheeze on forced expiration  Cardiovascular: RRR, heart sounds normal, no murmur or gallops, no peripheral edema  Musculoskeletal: No deformities, no cyanosis or clubbing  Neuro: alert, awake, non focal  Skin: Warm, no lesions or rash     Assessment & Plan:  Chronic cough Chronic cough since February and actually has had similar bouts over the years.  No history of asthma, no smoking although he may have had some significant secondhand smoke exposure.  Never has been documented to have COPD by pulmonary function testing.  Efforts have been made to treat chronic rhinitis and GERD.  He was also started empirically on Breztri, albuterol.  His CT scan of the chest from [facility] was reviewed, showed some peripheral basilar interstitial disease, question chronic aspiration, question other cause for mild ILD.  He does not have any significant exposure history and it is unclear to me whether the interstitial changes correlate with his cough but possible.  We will try to continue treating his chronic rhinitis, change his fluticasone nasal spray to every day.  Get a sputum sample.  I will space out his pantoprazole since he is more  symptomatic at night when he is supine.  Stop the [facility] as it may be acting as an upper airway irritant.  Check pulmonary function testing to assess for airflow obstruction, lower airways disease   Change your pantoprazole (Protonix) to 40 mg twice a day.  Take this 1 hour around food. Continue loratadine and Zyrtec as you have been taking them Start taking your fluticasone nasal spray, 2 sprays each nostril once daily every day.  Do not take it right before bedtime. Stop [employer]  You can keep albuterol available use 2 puffs if needed for shortness of breath, chest tightness, spells of coughing. We will obtain a sputum sample to send for culture. We will perform pulmonary function testing in next office visit. We reviewed your CT scan of the chest from [facility] today.  There are some areas of interstitial inflammation and scarring of unclear cause.  Uncertain whether this relates to your cough but we will continue to follow, decide whether any other work-up would be helpful. Follow with Dr. Lamonte Sakai next available with full pulmonary function testing on the same day.   ILD (interstitial lung disease) ([place]) Evident on his CT chest from [place] from this year.  Cause unclear.  No known exposures.  Unclear whether it correlates with his cough.  Consider chronic aspiration although he denies  overt symptoms.  Could consider other work-up including autoimmune evaluation depending on course.   Baltazar Apo, MD, PhD 06/13/2021, 5:24 PM [facility] [phone] or if no answer before 7:00PM call [phone] For any issues after 7:00PM please call eLink [phone]

## 2021-06-13 NOTE — Patient Instructions (Signed)
Change your pantoprazole (Protonix) to 40 mg twice a day.  Take this 1 hour around food. Continue loratadine and Zyrtec as you have been taking them Start taking your fluticasone nasal spray, 2 sprays each nostril once daily every day.  Do not take it right before bedtime. Stop [name] You can keep albuterol available use 2 puffs if needed for shortness of breath, chest tightness, spells of coughing. We will obtain a sputum sample to send for culture. We will perform pulmonary function testing in next office visit. We reviewed your CT scan of the chest from [facility] today.  There are some areas of interstitial inflammation and scarring of unclear cause.  Uncertain whether this relates to your cough but we will continue to follow, decide whether any other work-up would be helpful. Follow with Dr. Lamonte Sakai next available with full pulmonary function testing on the same day.

## 2021-06-13 NOTE — Assessment & Plan Note (Signed)
Evident on his CT chest from [place] from this year.  Cause unclear.  No known exposures.  Unclear whether it correlates with his cough.  Consider chronic aspiration although he denies  overt symptoms.  Could consider other work-up including autoimmune evaluation depending on course.

## 2021-06-23 DIAGNOSIS — Z6826 Body mass index (BMI) 26.0-26.9, adult: Secondary | ICD-10-CM | POA: Diagnosis not present

## 2021-06-23 DIAGNOSIS — M48062 Spinal stenosis, lumbar region with neurogenic claudication: Secondary | ICD-10-CM | POA: Diagnosis not present

## 2021-06-29 ENCOUNTER — Other Ambulatory Visit: Payer: Self-pay

## 2021-06-29 ENCOUNTER — Other Ambulatory Visit: Payer: Medicare Other

## 2021-06-29 DIAGNOSIS — R053 Chronic cough: Secondary | ICD-10-CM

## 2021-07-02 LAB — RESPIRATORY CULTURE OR RESPIRATORY AND SPUTUM CULTURE
MICRO NUMBER:: 12529218
RESULT:: NORMAL
SPECIMEN QUALITY:: ADEQUATE

## 2021-07-03 ENCOUNTER — Telehealth: Payer: Self-pay | Admitting: Emergency Medicine

## 2021-07-03 NOTE — Telephone Encounter (Signed)
I spoke with the wife and she let me know that the patient is having a lot of Mucus and is causing him to have shortness of breath. The wife was advised to make a follow up in the office and I have made a follow up with the NP on 07/04/21 and the wife is aware to seek emergency care if the symptoms get worse before the appointment. Nothing further needed.

## 2021-07-04 ENCOUNTER — Ambulatory Visit (INDEPENDENT_AMBULATORY_CARE_PROVIDER_SITE_OTHER): Payer: Medicare Other

## 2021-07-04 ENCOUNTER — Encounter: Payer: Self-pay | Admitting: Adult Health

## 2021-07-04 ENCOUNTER — Other Ambulatory Visit: Payer: Self-pay

## 2021-07-04 ENCOUNTER — Ambulatory Visit (INDEPENDENT_AMBULATORY_CARE_PROVIDER_SITE_OTHER): Payer: Medicare Other | Admitting: Adult Health

## 2021-07-04 VITALS — BP 130/70 | HR 105 | Temp 97.9°F | Ht 71.0 in | Wt 198.4 lb

## 2021-07-04 DIAGNOSIS — J208 Acute bronchitis due to other specified organisms: Secondary | ICD-10-CM | POA: Diagnosis not present

## 2021-07-04 DIAGNOSIS — J9811 Atelectasis: Secondary | ICD-10-CM | POA: Diagnosis not present

## 2021-07-04 DIAGNOSIS — J209 Acute bronchitis, unspecified: Secondary | ICD-10-CM | POA: Insufficient documentation

## 2021-07-04 DIAGNOSIS — R053 Chronic cough: Secondary | ICD-10-CM | POA: Diagnosis not present

## 2021-07-04 MED ORDER — PREDNISONE 20 MG PO TABS: 20.0000 mg | ORAL_TABLET | Freq: Every day | ORAL | 0 refills | Status: DC

## 2021-07-04 MED ORDER — AMOXICILLIN-POT CLAVULANATE 875-125 MG PO TABS: 1.0000 | ORAL_TABLET | Freq: Two times a day (BID) | ORAL | 0 refills | Status: AC

## 2021-07-04 MED ORDER — BREZTRI AEROSPHERE 160-9-4.8 MCG/ACT IN AERO: 2.0000 | INHALATION_SPRAY | Freq: Two times a day (BID) | RESPIRATORY_TRACT | 0 refills | Status: DC

## 2021-07-04 NOTE — Assessment & Plan Note (Signed)
[place]   Plan  Patient Instructions  Chest xray today .  Augmentin 875mg  Twice daily  for 1 week, take with food.  Prednisone 20mg  daily for 5 days  Liquid Mucinex DM 2 tsp Twice daily  for cough As needed   Restart BREZTRI w/ spacer 2 puffs Twice daily, rinse after use.  Albuterol inhaler or neb As needed   Continue on GERD diet  Continue on Protonix Twice daily   Follow up for PFT as planned.  Follow up with Dr. Lamonte Sakai  in 2 weeks as planned.  Please contact office for sooner follow up if symptoms do not improve or worsen or seek emergency care

## 2021-07-04 NOTE — Patient Instructions (Addendum)
Chest xray today .  Augmentin 875mg  Twice daily  for 1 week, take with food.  Prednisone 20mg  daily for 5 days  Liquid Mucinex DM 2 tsp Twice daily  for cough As needed   Restart BREZTRI w/ spacer 2 puffs Twice daily, rinse after use.  Albuterol inhaler or neb As needed   Continue on GERD diet  Continue on Protonix Twice daily   Follow up for PFT as planned.  Follow up with Dr. Lamonte Sakai  in 2 weeks as planned.  Please contact office for sooner follow up if symptoms do not improve or worsen or seek emergency care

## 2021-07-04 NOTE — Assessment & Plan Note (Signed)
PFT as planned   Plan  Patient Instructions  Chest xray today .  Augmentin 875mg  Twice daily  for 1 week, take with food.  Prednisone 20mg  daily for 5 days  Liquid Mucinex DM 2 tsp Twice daily  for cough As needed   Restart BREZTRI w/ spacer 2 puffs Twice daily, rinse after use.  Albuterol inhaler or neb As needed   Continue on GERD diet  Continue on Protonix Twice daily   Follow up for PFT as planned.  Follow up with Dr. Lamonte Sakai  in 2 weeks as planned.  Please contact office for sooner follow up if symptoms do not improve or worsen or seek emergency care

## 2021-07-04 NOTE — Progress Notes (Signed)
@Patient  ID: Duane Park, male    DOB: 1932/02/20, 87 y.o.   MRN: [id number]  Chief Complaint  Patient presents with   Acute Visit    Referring provider: Greig Right, MD  HPI: 87 year old male never smoker seen for pulmonary consult June 13, 2021 for chronic cough for years and abnormal CT chest with ILD changes  TEST/EVENTS :  ent eval by Dr Gaylyn Cheers  03/26/18 neg direct laryngoscopy - [facility] 04/01/2018  =   12   07/04/2021 Follow up : Cough and ILD  Patient presents for a work in visit.  Complains over the last 3 days he has had increased cough congestion with thick yellow-green mucus. Has not taken cold meds. Uses whiskey and honey to help with cough.  Patient does not feel well.  Feels that breathing is worse since stopping Breztri.  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  This was done at [facility].  Patient is a never smoker.  He was recommended to stop Breztri inhaler.  Set up for pulmonary function testing.  And recommended on aggressive GERD treatment with Protonix twice daily.  Also started on chronic rhinitis treatment with Claritin and Flonase.  PFTs are pending for next month   No Known Allergies  Immunization History  Administered Date(s) Administered   Moderna Sars-Covid-2 Vaccination 09/29/2019, 10/28/2019, 08/03/2020    Past Medical History:  Diagnosis Date   Chronic back pain    COPD (chronic obstructive pulmonary disease) ([place])    DNR (do not resuscitate) 10/22/2020   Hypercholesteremia    Hypertension    Stroke ([place])     Tobacco History: Social History   Tobacco Use  Smoking Status Never  Smokeless Tobacco Never   Counseling given: Not Answered   Outpatient Medications Prior to Visit  Medication Sig Dispense Refill   acetaminophen (TYLENOL) 325 MG  tablet Take 1-2 tablets (325-650 mg total) by mouth every 4 (four) hours as needed for mild pain.     albuterol (2.5 MG/3ML) 0.083% NEBU 3 mL, albuterol (5 MG/ML) 0.5% NEBU 0.5 mL Inhale 5 mg into the lungs daily as needed (shortness of breath).     albuterol (VENTOLIN HFA) 108 (90 Base) MCG/ACT inhaler Inhale 1-2 puffs into the lungs every 6 (six) hours as needed for wheezing or shortness of breath.     amLODipine (NORVASC) 5 MG tablet Take 5 mg by mouth daily.     aspirin EC 81 MG EC tablet Take 1 tablet (81 mg total) by mouth daily. 30 tablet 3   atorvastatin (LIPITOR) 80 MG tablet Take 1 tablet (80 mg total) by mouth daily at 6 PM. 30 tablet 3   BREZTRI AEROSPHERE 160-9-4.8 MCG/ACT AERO SMARTSIG:2 Puff(s) By Mouth Morning-Evening     celecoxib (CELEBREX) 200 MG capsule Take 200 mg by mouth at bedtime.     Cetirizine HCl (ZYRTEC PO) Take 10 mg by mouth.     diphenhydramine-acetaminophen (TYLENOL PM) 25-500 MG TABS tablet Take 2 tablets by mouth daily.     fluticasone (FLONASE) 50 MCG/ACT nasal spray Place 2 sprays into both nostrils as needed.     isosorbide mononitrate (IMDUR) 30 MG 24 hr tablet TAKE 1 TABLET(30 MG) BY MOUTH DAILY 90 tablet 3   Loratadine (CLARITIN PO) Take by mouth daily.     metroNIDAZOLE (METROCREAM) 0.75 % cream Apply  1 application topically 2 (two) times daily. On the head.     Multiple Vitamin (MULTIVITAMIN) tablet Take 1 tablet by mouth daily.     pantoprazole (PROTONIX) 40 MG tablet Take 2 tablets (80 mg total) by mouth daily. 90 tablet 3   psyllium (METAMUCIL) 58.6 % powder Take 1 packet by mouth daily.     traMADol (ULTRAM) 50 MG tablet Take 100 mg by mouth daily.     olmesartan-hydrochlorothiazide (BENICAR HCT) 40-12.5 MG tablet Take 1 tablet by mouth daily. (Patient not taking: Reported on 07/04/2021)     No facility-administered medications prior to visit.     Review of Systems:   Constitutional:   No  weight loss, night sweats,  Fevers, chills,  +fatigue,  or  lassitude.  HEENT:   No headaches,  Difficulty swallowing,  Tooth/dental problems, or  Sore throat,                No sneezing, itching, ear ache, nasal congestion, post nasal drip,   CV:  No chest pain,  Orthopnea, PND, swelling in lower extremities, anasarca, dizziness, palpitations, syncope.   GI  No heartburn, indigestion, abdominal pain, nausea, vomiting, diarrhea, change in bowel habits, loss of appetite, bloody stools.   Resp:   No chest wall deformity  Skin: no rash or lesions.  GU: no dysuria, change in color of urine, no urgency or frequency.  No flank pain, no hematuria   MS:  No joint pain or swelling.  No decreased range of motion.  No back pain.    Physical Exam  BP 130/70 (BP Location: Left Arm, Patient Position: Sitting, Cuff Size: Normal)   Pulse (!) 105   Temp 97.9 F (36.6 C) (Oral)   Ht 5\' 11"  (1.803 m)   Wt 198 lb 6.4 oz (90 kg)   SpO2 93%   BMI 27.67 kg/m   GEN: A/Ox3; pleasant , NAD, elderly    HEENT:  [place]/AT,  NOSE-clear, THROAT-clear, no lesions, no postnasal drip or exudate noted.   NECK:  Supple w/ fair ROM; no JVD; normal carotid impulses w/o bruits; no thyromegaly or nodules palpated; no lymphadenopathy.    RESP few scattered rhonchi  no accessory muscle use, no dullness to percussion  CARD:  RRR, no m/r/g, no peripheral edema, pulses intact, no cyanosis or clubbing.  GI:   Soft & nt; nml bowel sounds; no organomegaly or masses detected.   Musco: Warm bil, no deformities or joint swelling noted.   Neuro: alert, no focal deficits noted.    Skin: Warm, no lesions or rashes    Lab Results:      BNP No results found for: BNP  ProBNP No results found for: PROBNP  Imaging: DG Chest 2 View  Result Date: 07/04/2021 CLINICAL DATA:  Chronic cough EXAM: CHEST - 2 VIEW COMPARISON:  03/08/2021 FINDINGS: Normal heart size, mediastinal contours, and pulmonary vascularity. Atherosclerotic calcification aorta. Chronic elevation of  RIGHT diaphragm with mild RIGHT basilar atelectasis. Chronic peribronchial thickening and interstitial prominence in both lungs, stable. No acute infiltrate, pleural effusion, or pneumothorax. Bones demineralized. IMPRESSION: Chronic bronchitic and interstitial changes with mild RIGHT basilar atelectasis. No acute abnormalities. Aortic Atherosclerosis (ICD10-I70.0). Electronically Signed   By: Lavonia Dana M.D.   On: 07/04/2021 11:18      No flowsheet data found.        Assessment & Plan:   Acute bronchitis Flare   Plan  Patient Instructions  Chest xray today .  Augmentin 875mg  Twice  daily  for 1 week, take with food.  Prednisone 20mg  daily for 5 days  Liquid Mucinex DM 2 tsp Twice daily  for cough As needed   Restart BREZTRI w/ spacer 2 puffs Twice daily, rinse after use.  Albuterol inhaler or neb As needed   Continue on GERD diet  Continue on Protonix Twice daily   Follow up for PFT as planned.  Follow up with Dr. Lamonte Sakai  in 2 weeks as planned.  Please contact office for sooner follow up if symptoms do not improve or worsen or seek emergency care         Chronic cough PFT as planned   Plan  Patient Instructions  Chest xray today .  Augmentin 875mg  Twice daily  for 1 week, take with food.  Prednisone 20mg  daily for 5 days  Liquid Mucinex DM 2 tsp Twice daily  for cough As needed   Restart BREZTRI w/ spacer 2 puffs Twice daily, rinse after use.  Albuterol inhaler or neb As needed   Continue on GERD diet  Continue on Protonix Twice daily   Follow up for PFT as planned.  Follow up with Dr. Lamonte Sakai  in 2 weeks as planned.  Please contact office for sooner follow up if symptoms do not improve or worsen or seek emergency care           Rexene Edison, NP 07/04/2021

## 2021-07-05 NOTE — Progress Notes (Signed)
Called and spoke with [name], patient's wife (DPR), provided results/recommendations per Rexene Edison NP.  She verbalized understanding.  Nothing further needed.

## 2021-07-13 DIAGNOSIS — M48062 Spinal stenosis, lumbar region with neurogenic claudication: Secondary | ICD-10-CM | POA: Diagnosis not present

## 2021-07-13 DIAGNOSIS — M48061 Spinal stenosis, lumbar region without neurogenic claudication: Secondary | ICD-10-CM | POA: Diagnosis not present

## 2021-07-19 ENCOUNTER — Encounter: Payer: Self-pay | Admitting: Emergency Medicine

## 2021-07-19 ENCOUNTER — Ambulatory Visit (INDEPENDENT_AMBULATORY_CARE_PROVIDER_SITE_OTHER): Payer: Medicare Other | Admitting: Emergency Medicine

## 2021-07-19 ENCOUNTER — Other Ambulatory Visit: Payer: Self-pay

## 2021-07-19 VITALS — BP 140/80 | HR 81 | Temp 97.8°F | Ht 71.0 in | Wt 199.0 lb

## 2021-07-19 DIAGNOSIS — R053 Chronic cough: Secondary | ICD-10-CM

## 2021-07-19 LAB — PULMONARY FUNCTION TEST
DL/VA % pred: 95 %
DL/VA: 3.6 ml/min/mmHg/L
DLCO cor % pred: 79 %
DLCO cor: 19.24 ml/min/mmHg
DLCO unc % pred: 79 %
DLCO unc: 19.24 ml/min/mmHg
FEF 25-75 Post: 3.35 L/sec
FEF 25-75 Pre: 2.48 L/sec
FEF2575-%Change-Post: 34 %
FEF2575-%Pred-Post: 195 %
FEF2575-%Pred-Pre: 144 %
FEV1-%Change-Post: 6 %
FEV1-%Pred-Post: 96 %
FEV1-%Pred-Pre: 89 %
FEV1-Post: 2.6 L
FEV1-Pre: 2.43 L
FEV1FVC-%Change-Post: 4 %
FEV1FVC-%Pred-Pre: 114 %
FEV6-%Change-Post: 3 %
FEV6-%Pred-Post: 86 %
FEV6-%Pred-Pre: 83 %
FEV6-Post: 3.11 L
FEV6-Pre: 3.01 L
FEV6FVC-%Pred-Post: 107 %
FEV6FVC-%Pred-Pre: 107 %
FVC-%Change-Post: 2 %
FVC-%Pred-Post: 79 %
FVC-%Pred-Pre: 77 %
FVC-Post: 3.11 L
FVC-Pre: 3.03 L
Post FEV1/FVC ratio: 83 %
Post FEV6/FVC ratio: 100 %
Pre FEV1/FVC ratio: 80 %
Pre FEV6/FVC Ratio: 100 %
RV % pred: 83 %
RV: 2.37 L
TLC % pred: 74 %
TLC: 5.39 L

## 2021-07-19 MED ORDER — LEVOFLOXACIN 500 MG PO TABS: 500.0000 mg | ORAL_TABLET | Freq: Every day | ORAL | 0 refills | Status: DC

## 2021-07-19 MED ORDER — BREZTRI AEROSPHERE 160-9-4.8 MCG/ACT IN AERO: 2.0000 | INHALATION_SPRAY | Freq: Two times a day (BID) | RESPIRATORY_TRACT | 0 refills | Status: DC

## 2021-07-19 MED ORDER — METHYLPREDNISOLONE ACETATE 80 MG/ML IJ SUSP
120.0000 mg | Freq: Once | INTRAMUSCULAR | Status: AC
  Administered 2021-07-19: 120 mg via INTRAMUSCULAR

## 2021-07-19 MED ORDER — PREDNISONE 10 MG PO TABS: ORAL_TABLET | ORAL | 0 refills | Status: DC

## 2021-07-19 MED ORDER — METHYLPREDNISOLONE ACETATE 80 MG/ML IJ SUSP: 80.0000 mg | Freq: Once | INTRAMUSCULAR | Status: DC

## 2021-07-19 NOTE — Progress Notes (Signed)
Subjective:    Patient ID: Duane Park, male    DOB: 02/06/1932, 87 y.o.   MRN: [id number]  HPI 86 year old man, never smoker, with history of hypertension, hypercholesterolemia, stroke.  Carries a diagnosis of COPD, question chronic asthma although I do not see pulmonary function testing. He started to have cough and increased congestion in Feb, unclear inciting event.   He is on Breztri that was added recently 05/12/21, has albuterol but had not been using frequently, has not helped. The [name] may have helped him some, a bit less cough.  On loratadine + zyrtec, fluticasone nasal spray as needed, saline spray as needed, pantoprazole 80 mg daily, minimal breakthrough  He has been treated with pred, abx a few times. May have slowed down with this but never stopped.  Bothers him the most at night when he lays down. Wakes him from sleep. Productive of yellow/ green. For the last few weeks happening less during the day. Never hemoptysis. No aspiration sx. No cough with food or drink.    ROV 07/19/21 --87 year old never smoker.  He has a history of hypertension, hypercholesterolemia and CVA.  I saw him for chronic cough in early October.  He also has CT chest with some basilar interstitial changes. At that time we stopped Breztri.  I attempted to treat his GERD and chronic rhinitis aggressively.  He was seen 3 weeks later with persistent cough, green sputum.  He was treated with Augmentin, prednisone and his [name] was restarted. He has stable dyspnea. Still with cough productive of green mucous. Can happen at any time. Minimal albuterol use.   Pulmonary function testing performed today and reviewed by me showed spirometry suggestive of mild restriction without a bronchodilator response.  No curbed his flow-volume loop.  Decreased total lung capacity consistent with restrictive volumes.  Decreased diffusion capacity that corrects to the normal range when adjusted for alveolar volume.  Sputum cx  06/29/2021 showed normal flora.   Review of Systems As per HPI     Objective:   Physical Exam  Vitals:   07/19/21 1604  BP: 140/80  Pulse: 81  Temp: 97.8 F (36.6 C)  TempSrc: Oral  SpO2: 97%  Weight: 199 lb (90.3 kg)  Height: 5\' 11"  (1.803 m)    Gen: Pleasant, well-nourished elderly man, in no distress,  normal affect  ENT: No lesions,  mouth clear,  oropharynx clear, no postnasal drip  Neck: No JVD, no stridor  Lungs: No use of accessory muscles, bilateral inspiratory rhonchi, no wheezes  Cardiovascular: RRR, heart sounds normal, no murmur or gallops, no peripheral edema  Musculoskeletal: No deformities, no cyanosis or clubbing  Neuro: alert, awake, non focal  Skin: Warm, no lesions or rash      Assessment & Plan:  Chronic cough He is frustrated by persistent chronic cough, produces green mucus daily.  Certainly bronchiectatic change on his CT chest is a factor.  Suspect his mucus production will not fully resolve due to this.  Need to try and address any and all factors that may contribute upper airway irritation.  He is on a regimen for GERD and for allergic rhinitis.  Pulmonary function testing today did not show any evidence for obstructive lung disease.  I think it would be reasonable to get him off Breztri but he felt that he had a clinical benefit.  He does not use albuterol.  He has benefited in the past from treatment for acute flares, acute bronchitis and wonders whether it would  be helpful if we did so again.  Unfortunately he did not fully improve after similar treatment in the spring by his PCP or at the end of October here.  I wanted to treat him for acute flare to see if he gets benefit.  Continue to treat his rhinitis, GERD.  I can discuss possibly stopping maintenance bronchodilator therapy with him next time.  I do not want to change to anything that once such that we cannot tell what brought about benefit.  Depo-Medrol shot today Take prednisone as  directed until completely gone. Take Levaquin as directed until completely gone. Continue pantoprazole 40 mg twice a day.  Take this medication 1 hour around food. Take fluticasone nasal spray, 2 sprays each nostril once daily. Take loratadine and Zyrtec once daily. We will continue Breztri for now.  Use 2 puffs twice a day.  Rinse and gargle after using.  We may decide to stop this medication going forward. Keep albuterol available to use 2 puffs if needed for shortness of breath, chest tightness, wheezing. Follow with Dr Lamonte Sakai in 3 months or sooner if you have any problems.  Time spent 45 minutes  Baltazar Apo, MD, PhD 07/19/2021, 5:05 PM [facility] [phone] or if no answer before 7:00PM call [phone] For any issues after 7:00PM please call eLink [phone]

## 2021-07-19 NOTE — Patient Instructions (Signed)
Full PFT performed today. °

## 2021-07-19 NOTE — Assessment & Plan Note (Signed)
He is frustrated by persistent chronic cough, produces green mucus daily.  Certainly bronchiectatic change on his CT chest is a factor.  Suspect his mucus production will not fully resolve due to this.  Need to try and address any and all factors that may contribute upper airway irritation.  He is on a regimen for GERD and for allergic rhinitis.  Pulmonary function testing today did not show any evidence for obstructive lung disease.  I think it would be reasonable to get him off Breztri but he felt that he had a clinical benefit.  He does not use albuterol.  He has benefited in the past from treatment for acute flares, acute bronchitis and wonders whether it would be helpful if we did so again.  Unfortunately he did not fully improve after similar treatment in the spring by his PCP or at the end of October here.  I wanted to treat him for acute flare to see if he gets benefit.  Continue to treat his rhinitis, GERD.  I can discuss possibly stopping maintenance bronchodilator therapy with him next time.  I do not want to change to anything that once such that we cannot tell what brought about benefit.  Depo-Medrol shot today Take prednisone as directed until completely gone. Take Levaquin as directed until completely gone. Continue pantoprazole 40 mg twice a day.  Take this medication 1 hour around food. Take fluticasone nasal spray, 2 sprays each nostril once daily. Take loratadine and Zyrtec once daily. We will continue Breztri for now.  Use 2 puffs twice a day.  Rinse and gargle after using.  We may decide to stop this medication going forward. Keep albuterol available to use 2 puffs if needed for shortness of breath, chest tightness, wheezing. Follow with Dr Lamonte Sakai in 3 months or sooner if you have any problems.

## 2021-07-19 NOTE — Progress Notes (Signed)
Full PFT performed today. °

## 2021-07-19 NOTE — Patient Instructions (Addendum)
Depo-Medrol shot today Take prednisone as directed until completely gone. Take Levaquin as directed until completely gone. Continue pantoprazole 40 mg twice a day.  Take this medication 1 hour around food. Take fluticasone nasal spray, 2 sprays each nostril once daily. Take loratadine and Zyrtec once daily. We will continue Breztri for now.  Use 2 puffs twice a day.  Rinse and gargle after using.  We may decide to stop this medication going forward. Keep albuterol available to use 2 puffs if needed for shortness of breath, chest tightness, wheezing. Follow with Dr Lamonte Sakai in 3 months or sooner if you have any problems.

## 2021-07-20 MED ORDER — BREZTRI AEROSPHERE 160-9-4.8 MCG/ACT IN AERO: 2.0000 | INHALATION_SPRAY | Freq: Two times a day (BID) | RESPIRATORY_TRACT | 0 refills | Status: DC

## 2021-07-20 NOTE — Addendum Note (Signed)
Addended by: Gavin Potters R on: 07/20/2021 10:25 AM   Modules accepted: Orders

## 2021-08-18 DIAGNOSIS — Z23 Encounter for immunization: Secondary | ICD-10-CM | POA: Diagnosis not present

## 2021-08-24 DIAGNOSIS — I10 Essential (primary) hypertension: Secondary | ICD-10-CM | POA: Diagnosis not present

## 2021-08-24 DIAGNOSIS — E663 Overweight: Secondary | ICD-10-CM | POA: Diagnosis not present

## 2021-08-24 DIAGNOSIS — M5116 Intervertebral disc disorders with radiculopathy, lumbar region: Secondary | ICD-10-CM | POA: Diagnosis not present

## 2021-08-24 DIAGNOSIS — Z6828 Body mass index (BMI) 28.0-28.9, adult: Secondary | ICD-10-CM | POA: Diagnosis not present

## 2021-08-24 DIAGNOSIS — R053 Chronic cough: Secondary | ICD-10-CM | POA: Diagnosis not present

## 2021-08-29 DIAGNOSIS — M48062 Spinal stenosis, lumbar region with neurogenic claudication: Secondary | ICD-10-CM | POA: Diagnosis not present

## 2021-09-15 ENCOUNTER — Encounter: Payer: Self-pay | Admitting: Emergency Medicine

## 2021-09-15 ENCOUNTER — Ambulatory Visit (INDEPENDENT_AMBULATORY_CARE_PROVIDER_SITE_OTHER): Payer: Medicare Other | Admitting: Emergency Medicine

## 2021-09-15 ENCOUNTER — Other Ambulatory Visit: Payer: Self-pay

## 2021-09-15 DIAGNOSIS — R053 Chronic cough: Secondary | ICD-10-CM

## 2021-09-15 DIAGNOSIS — J449 Chronic obstructive pulmonary disease, unspecified: Secondary | ICD-10-CM

## 2021-09-15 MED ORDER — NYSTATIN 100000 UNIT/ML MT SUSP: 10.0000 mL | Freq: Two times a day (BID) | OROMUCOSAL | 0 refills | Status: DC

## 2021-09-15 NOTE — Progress Notes (Signed)
Subjective:    Patient ID: Duane Park, male    DOB: [birth year], 88 y.o.   MRN: [id number]  HPI 88 year old man, never smoker, with history of hypertension, hypercholesterolemia, stroke.  Carries a diagnosis of COPD, question chronic asthma although I do not see pulmonary function testing. He started to have cough and increased congestion in Feb, unclear inciting event.   ROV 07/19/21 --88 year old never smoker.  He has a history of hypertension, hypercholesterolemia and CVA.  I saw him for chronic cough in early October.  He also has CT chest with some basilar interstitial changes. At that time we stopped Breztri.  I attempted to treat his GERD and chronic rhinitis aggressively.  He was seen 3 weeks later with persistent cough, green sputum.  He was treated with Augmentin, prednisone and his [name] was restarted. He has stable dyspnea. Still with cough productive of green mucous. Can happen at any time. Minimal albuterol use.   Pulmonary function testing performed today and reviewed by me showed spirometry suggestive of mild restriction without a bronchodilator response.  No curbed his flow-volume loop.  Decreased total lung capacity consistent with restrictive volumes.  Decreased diffusion capacity that corrects to the normal range when adjusted for alveolar volume.  Sputum cx 06/29/2021 showed normal flora.   ROV 09/15/21 --[name] is 57 and follows up for chronic cough.  He is a never smoker with a history of hypertension, hypercholesterolemia and CVA.  He has some basilar interstitial disease and some bronchiectasis on chest imaging.  He has been treated with antibiotics and prednisone for possible bronchitis, also received empiric Breztri without much impact.  He was still symptomatic when I saw him in November and wanted to be treated again for an acute flare so I gave him Depo-Medrol, prednisone, Levaquin.  I continued pantoprazole twice a day, added fluticasone and antihistamine.  I  continued his [name] although unclear if he is benefiting. He believes that it is helping him, but he is starting to have some mouth irritation. Sputum production is less. Cough is better. He uses albuterol about 2 x a day, usually for throat congestion.    Review of Systems As per HPI     Objective:   Physical Exam  Vitals:   09/15/21 1458  BP: 132/70  Pulse: 87  Temp: 97.6 F (36.4 C)  TempSrc: Oral  SpO2: 98%  Weight: 201 lb 12.8 oz (91.5 kg)  Height: 5\' 11"  (1.803 m)    Gen: Pleasant, well-nourished elderly man, in no distress,  normal affect  ENT: No lesions,  mouth clear,  oropharynx clear, no postnasal drip  Neck: No JVD, no stridor  Lungs: No use of accessory muscles, bilateral inspiratory rhonchi, no wheezes  Cardiovascular: RRR, heart sounds normal, no murmur or gallops, no peripheral edema  Musculoskeletal: No deformities, no cyanosis or clubbing  Neuro: alert, awake, non focal  Skin: Warm, no lesions or rash      Assessment & Plan:  Chronic obstructive pulmonary disease (HCC) His pulmonary function testing does not support obstructive lung disease.  I do not think he has COPD.  He has upper airway irritation, no wheeze and cough.  I will stop his [name] again and ensure that he does not lose any ground.  He can continue albuterol as needed.  Chronic cough Principally upper airway irritation.  His cough frequency is much better, his sputum production is much better.  He does still have some upper airway symptoms.  He seemed to benefit  significantly from the addition of the fluticasone and the antihistamine and we will plan to continue these.  Also continue his PPI.  I will stop his [name] since he does not have obstruction on PFT.  He can keep the albuterol available to use if needed.    Duane Apo, MD, PhD 09/15/2021, 3:18 PM [facility] [phone] or if no answer before 7:00PM call [phone] For any issues after 7:00PM  please call eLink [phone]

## 2021-09-15 NOTE — Patient Instructions (Addendum)
Try stopping Breztri. Keep your albuterol available use 2 puffs when you need it for shortness of breath, cough, chest tightness Continue fluticasone nasal spray, 2 sprays each nostril once daily. Continue Zyrtec 10 mg once daily. Continue your Protonix 40 mg twice a day.  Take this medication 1 hour around food. Use nystatin swish and spit, gargle 10 cc twice a day for 3 days. Follow with Dr Lamonte Sakai if needed for any changes in your cough or breathing.

## 2021-09-15 NOTE — Assessment & Plan Note (Signed)
His pulmonary function testing does not support obstructive lung disease.  I do not think he has COPD.  He has upper airway irritation, no wheeze and cough.  I will stop his [name] again and ensure that he does not lose any ground.  He can continue albuterol as needed.

## 2021-09-15 NOTE — Assessment & Plan Note (Signed)
Principally upper airway irritation.  His cough frequency is much better, his sputum production is much better.  He does still have some upper airway symptoms.  He seemed to benefit significantly from the addition of the fluticasone and the antihistamine and we will plan to continue these.  Also continue his PPI.  I will stop his [name] since he does not have obstruction on PFT.  He can keep the albuterol available to use if needed.

## 2021-09-15 NOTE — Addendum Note (Signed)
Addended by: Gavin Potters R on: 09/15/2021 03:23 PM   Modules accepted: Orders

## 2021-09-21 ENCOUNTER — Ambulatory Visit (INDEPENDENT_AMBULATORY_CARE_PROVIDER_SITE_OTHER): Payer: Medicare Other | Admitting: Podiatry

## 2021-09-21 ENCOUNTER — Other Ambulatory Visit: Payer: Self-pay

## 2021-09-21 ENCOUNTER — Encounter: Payer: Self-pay | Admitting: Podiatry

## 2021-09-21 DIAGNOSIS — M79675 Pain in left toe(s): Secondary | ICD-10-CM

## 2021-09-21 DIAGNOSIS — L600 Ingrowing nail: Secondary | ICD-10-CM | POA: Diagnosis not present

## 2021-09-21 DIAGNOSIS — B351 Tinea unguium: Secondary | ICD-10-CM | POA: Diagnosis not present

## 2021-09-21 DIAGNOSIS — M2011 Hallux valgus (acquired), right foot: Secondary | ICD-10-CM | POA: Diagnosis not present

## 2021-09-21 DIAGNOSIS — M2012 Hallux valgus (acquired), left foot: Secondary | ICD-10-CM

## 2021-09-21 DIAGNOSIS — M79674 Pain in right toe(s): Secondary | ICD-10-CM | POA: Diagnosis not present

## 2021-09-21 NOTE — Patient Instructions (Signed)
EPSOM SALT FOOT SOAK INSTRUCTIONS   Shopping List:  A. Plain epsom salt (not scented) B. Neosporin Cream/Ointment or Bacitracin Cream/Ointment (or prescribed antiobiotic drops/cream/ointment) C. 1-inch fabric band-aids   Place 1/4 cup of epsom salts in 2 quarts of warm tap water. IF YOU ARE DIABETIC, OR HAVE NEUROPATHY, CHECK THE TEMPERATURE OF THE WATER WITH YOUR ELBOW.   Submerge your foot/feet in the solution and soak for 10-15 minutes.      3.  Next, remove your foot/feet from solution, blot dry the affected area.    4.  Apply light amount of antibiotic cream/ointment and cover with fabric band-aid .  5.  This soak should be done once a day for up to 7 days.   6.  Monitor for any signs/symptoms of infection such as redness, swelling, odor, drainage, increased pain, or non-healing of digit.   7.  Please do not hesitate to call the office and speak to a Nurse or Doctor if you have questions.   8.  If you experience fever, chills, nightsweats, nausea or vomiting with worsening of digit/foot, please go to the emergency room.    Ingrown Toenail An ingrown toenail occurs when the corner or sides of a toenail grow into the surrounding skin. This causes discomfort and pain. The big toe is most commonly affected, but any of the toes can be affected. If an ingrown toenail is not treated, it can become infected. What are the causes? This condition may be caused by: Wearing shoes that are too small or tight. An injury, such as stubbing your toe or having your toe stepped on. Improper cutting or care of your toenails. Having nail or foot abnormalities that were present from birth (congenital abnormalities), such as having a nail that is too big for your toe. What increases the risk? The following factors may make you more likely to develop ingrown toenails: Age. Nails tend to get thicker with age, so ingrown nails are more common among older people. Cutting your toenails incorrectly, such  as cutting them very short or cutting them unevenly. An ingrown toenail is more likely to get infected if you have: Diabetes. Blood flow (circulation) problems. What are the signs or symptoms? Symptoms of an ingrown toenail may include: Pain, soreness, or tenderness. Redness. Swelling. Hardening of the skin that surrounds the toenail. Signs that an ingrown toenail may be infected include: Fluid or pus. Symptoms that get worse. How is this diagnosed? Ingrown toenails may be diagnosed based on: Your symptoms and medical history. A physical exam. Labs or tests. If you have fluid or blood coming from your toenail, a sample may be collected to test for the specific type of bacteria that is causing the infection. How is this treated? Treatment depends on the severity of your symptoms. You may be able to care for your toenail at home. If you have an infection, you may be prescribed antibiotic medicines. If you have fluid or pus draining from your toenail, your health care provider may drain it. If you have trouble walking, you may be given crutches to use. If you have a severe or infected ingrown toenail, you may need a procedure to remove part or all of the nail. Follow these instructions at home: [place] your wound every day for signs of infection, or as often as told by your health care provider. Check for: More redness, swelling, or pain. More fluid or blood. Warmth. Pus or a bad smell. Do not pick at your  toenail or try to remove it yourself. Soak your foot in warm, soapy water. Do this for 20 minutes, 3 times a day, or as often as told by your health care provider. This helps to keep your toe clean and your skin soft. Wear shoes that fit well and are not too tight. Your health care provider may recommend that you wear open-toed shoes while you heal. Trim your toenails regularly and carefully. Cut your toenails straight across to prevent injury to the skin at the corners of  the toenail. Do not cut your nails in a curved shape. Keep your feet clean and dry to help prevent infection. General instructions Take over-the-counter and prescription medicines only as told by your health care provider. If you were prescribed an antibiotic, take it as told by your health care provider. Do not stop taking the antibiotic even if you start to feel better. If your health care provider told you to use crutches to help you move around, use them as instructed. Return to your normal activities as told by your health care provider. Ask your health care provider what activities are safe for you. Keep all follow-up visits. This is important. Contact a health care provider if: You have more redness, swelling, pain, or other symptoms that do not improve with treatment. You have fluid, blood, or pus coming from your toenail. You have a red streak on your skin that starts at your foot and spreads up your leg. You have a fever. Summary An ingrown toenail occurs when the corner or sides of a toenail grow into the surrounding skin. This causes discomfort and pain. The big toe is most commonly affected, but any of the toes can be affected. If an ingrown toenail is not treated, it can become infected. Fluid or pus draining from your toenail is a sign of infection. Your health care provider may need to drain it. You may be given antibiotics to treat the infection. Trimming your toenails regularly and properly can help you prevent an ingrown toenail. This information is not intended to replace advice given to you by your health care provider. Make sure you discuss any questions you have with your health care provider. Document Revised: 12/27/2020 Document Reviewed: 12/27/2020 Elsevier Patient Education  [place].

## 2021-09-25 ENCOUNTER — Encounter: Payer: Self-pay | Admitting: Podiatry

## 2021-09-25 NOTE — Progress Notes (Signed)
Subjective: Duane Park presents today referred by Duane Right, MD for complaint of painful elongated mycotic toenails 1-5 bilaterally x several years which are tender when wearing enclosed shoe gear. Previous treatments include trimming by wife and salon pedicures.  He has also seen a Podiatrist in the past. Patient has h/o matrixectomy procedure medial border of left hallux.  He relates painful ingrown toenail Park great toe aggravated when wearing enclosed shoe gear. He denies any redness, drainage or swelling.  Past Medical History:  Diagnosis Date   Chronic back pain    COPD (chronic obstructive pulmonary disease) ([place])    DNR (do not resuscitate) 10/22/2020   Hypercholesteremia    Hypertension    Stroke [facility])      Patient Active Problem List   Diagnosis Date Noted   Acute bronchitis 07/04/2021   ILD (interstitial lung disease) ([place]) 06/13/2021   Chronic obstructive pulmonary disease ([place]) 03/15/2021   Chest pain 10/22/2020   URI (upper respiratory infection) 10/22/2020   DNR (do not resuscitate) 10/22/2020   Small vessel disease ([place]) 51/10/5850   Diastolic dysfunction    Stage 3 chronic kidney disease (H[facility])    Aortic atherosclerosis ([place]) 04/13/2018   Cerebral thrombosis with cerebral infarction 04/13/2018   Cerebrovascular accident (CVA) ([place])    Hyperlipidemia    Essential hypertension    Gastroesophageal reflux disease    Weakness 04/12/2018   Chronic cough 03/28/2018   OSTEOARTHRITIS, BACK 09/13/2009   ARTHRITIS, KNEES, BILATERAL 09/13/2009   CAVUS DEFORMITY OF FOOT, ACQUIRED 09/13/2009   ABNORMALITY OF GAIT 09/13/2009     Past Surgical History:  Procedure Laterality Date   BACK SURGERY     JOINT REPLACEMENT     arthroscopic surgery rt side     Current Outpatient Medications on File Prior to Visit  Medication Sig Dispense Refill   acetaminophen (TYLENOL) 325 MG tablet Take 1-2 tablets (325-650 mg total) by mouth every 4 (four) hours as needed for mild  pain.     albuterol (2.5 MG/3ML) 0.083% NEBU 3 mL, albuterol (5 MG/ML) 0.5% NEBU 0.5 mL Inhale 5 mg into the lungs daily as needed (shortness of breath).     albuterol (VENTOLIN HFA) 108 (90 Base) MCG/ACT inhaler Inhale 1-2 puffs into the lungs every 6 (six) hours as needed for wheezing or shortness of breath.     amLODipine (NORVASC) 5 MG tablet Take 5 mg by mouth daily.     aspirin EC 81 MG EC tablet Take 1 tablet (81 mg total) by mouth daily. 30 tablet 3   atorvastatin (LIPITOR) 80 MG tablet Take 1 tablet (80 mg total) by mouth daily at 6 PM. 30 tablet 3   BREZTRI AEROSPHERE 160-9-4.8 MCG/ACT AERO SMARTSIG:2 Puff(s) By Mouth Morning-Evening     Cetirizine HCl (ZYRTEC PO) Take 10 mg by mouth.     isosorbide mononitrate (IMDUR) 30 MG 24 hr tablet TAKE 1 TABLET(30 MG) BY MOUTH DAILY 90 tablet 3   Loratadine (CLARITIN PO) Take by mouth daily.     olmesartan-hydrochlorothiazide (BENICAR HCT) 40-12.5 MG tablet Take 1 tablet by mouth daily.     pantoprazole (PROTONIX) 40 MG tablet Take 2 tablets (80 mg total) by mouth daily. 90 tablet 3   traMADol (ULTRAM) 50 MG tablet Take 100 mg by mouth daily.     No current facility-administered medications on file prior to visit.     No Known Allergies   Social History   Occupational History   Not on file  Tobacco Use  Smoking status: Never   Smokeless tobacco: Never  Substance and Sexual Activity   Alcohol use: Never   Drug use: Never   Sexual activity: Not on file     Family History  Problem Relation Age of Onset   Lung disease Mother        never smoker   Heart disease Father      Immunization History  Administered Date(s) Administered   Fluad Quad(high Dose 65+) 08/29/2021   Moderna Sars-Covid-2 Vaccination 09/29/2019, 10/28/2019, 08/03/2020     Objective: Duane Park is a pleasant 88 y.o. male WD, WN in NAD. AAO x 3.  There were no vitals filed for this visit.  Vascular Examination:  CFT <3 seconds b/l LE. Faintly palpable  pedal pulses b/l LE. Pedal hair absent b/l LE. Skin temperature gradient WNL b/l. No pain with calf compression b/l. No edema b/l LE. No cyanosis or clubbing noted b/l LE.  Dermatological Examination: Pedal integument with normal turgor, texture and tone b/l LE. No open wounds b/l. No interdigital macerations b/l. Toenails 1-5 bilaterally elongated, thickened, discolored with subungual debris. +Tenderness with dorsal palpation of nailplates. No hyperkeratotic or porokeratotic lesions present. Incurvated nailplate medial border(s) R hallux.  Nail border hypertrophy present. There is tenderness to palpation. Sign(s) of infection: no clinical signs of infection noted on examination today.. Procedure site of L hallux noted to be completely healed with no erythema, no edema, no drainage, no purulence.   Musculoskeletal: Normal muscle strength 5/5 to all lower extremity muscle groups bilaterally. HAV with bunion deformity noted b/l LE.Marland Kitchen No pain, crepitus or joint limitation noted with ROM b/l LE.  Patient ambulates independently without assistive aids.  Neurological: Protective sensation intact 5/5 intact bilaterally with 10g monofilament b/l. Vibratory sensation intact b/l.  A1c:   Hemoglobin A1C Latest Ref Rng & Units 10/22/2020  HGBA1C 4.8 - 5.6 % 5.4  Some recent data might be hidden   Assessment: 1. Pain due to onychomycosis of toenails of both feet   2. Ingrown toenail without infection   3. Hallux valgus, acquired, bilateral   4. Pain in left toe(s)   5. Pain in Park toe(s)     Plan: -Examined patient. -Mycotic toenails 1-5 bilaterally were debrided in length and girth with sterile nail nippers and dremel without iatrogenic bleeding. -Offending nail border debrided and curretaged R hallux utilizing sterile nail nipper and currette. Border(s) cleansed with alcohol and triple antibiotic ointment applied. Dispensed written instructions for once daily epsom salt soaks for 7 days. -Patient to  report any pedal injuries to medical professional immediately. -Patient/POA to call should there be question/concern in the interim.  Return in about 3 months (around 12/20/2021).  Marzetta Board, DPM

## 2021-09-26 DIAGNOSIS — M5416 Radiculopathy, lumbar region: Secondary | ICD-10-CM | POA: Diagnosis not present

## 2021-09-26 DIAGNOSIS — M5116 Intervertebral disc disorders with radiculopathy, lumbar region: Secondary | ICD-10-CM | POA: Diagnosis not present

## 2021-10-08 DIAGNOSIS — E782 Mixed hyperlipidemia: Secondary | ICD-10-CM | POA: Diagnosis not present

## 2021-10-08 DIAGNOSIS — I10 Essential (primary) hypertension: Secondary | ICD-10-CM | POA: Diagnosis not present

## 2021-10-08 DIAGNOSIS — I700 Atherosclerosis of aorta: Secondary | ICD-10-CM | POA: Diagnosis not present

## 2021-11-07 DIAGNOSIS — I700 Atherosclerosis of aorta: Secondary | ICD-10-CM | POA: Diagnosis not present

## 2021-11-07 DIAGNOSIS — E782 Mixed hyperlipidemia: Secondary | ICD-10-CM | POA: Diagnosis not present

## 2021-11-07 DIAGNOSIS — I10 Essential (primary) hypertension: Secondary | ICD-10-CM | POA: Diagnosis not present

## 2021-11-27 DIAGNOSIS — R053 Chronic cough: Secondary | ICD-10-CM | POA: Diagnosis not present

## 2021-11-27 DIAGNOSIS — Z8673 Personal history of transient ischemic attack (TIA), and cerebral infarction without residual deficits: Secondary | ICD-10-CM | POA: Diagnosis not present

## 2021-11-27 DIAGNOSIS — E663 Overweight: Secondary | ICD-10-CM | POA: Diagnosis not present

## 2021-11-27 DIAGNOSIS — M7501 Adhesive capsulitis of right shoulder: Secondary | ICD-10-CM | POA: Diagnosis not present

## 2021-11-27 DIAGNOSIS — Z6829 Body mass index (BMI) 29.0-29.9, adult: Secondary | ICD-10-CM | POA: Diagnosis not present

## 2021-11-27 DIAGNOSIS — Z79899 Other long term (current) drug therapy: Secondary | ICD-10-CM | POA: Diagnosis not present

## 2021-11-27 DIAGNOSIS — M5116 Intervertebral disc disorders with radiculopathy, lumbar region: Secondary | ICD-10-CM | POA: Diagnosis not present

## 2021-11-27 DIAGNOSIS — I10 Essential (primary) hypertension: Secondary | ICD-10-CM | POA: Diagnosis not present

## 2021-11-27 DIAGNOSIS — I700 Atherosclerosis of aorta: Secondary | ICD-10-CM | POA: Diagnosis not present

## 2021-11-27 DIAGNOSIS — R739 Hyperglycemia, unspecified: Secondary | ICD-10-CM | POA: Diagnosis not present

## 2021-12-14 DIAGNOSIS — H6122 Impacted cerumen, left ear: Secondary | ICD-10-CM | POA: Diagnosis not present

## 2021-12-20 DIAGNOSIS — M25511 Pain in right shoulder: Secondary | ICD-10-CM | POA: Diagnosis not present

## 2021-12-28 ENCOUNTER — Encounter: Payer: Self-pay | Admitting: Podiatry

## 2021-12-28 ENCOUNTER — Other Ambulatory Visit: Payer: Self-pay | Admitting: *Deleted

## 2021-12-28 ENCOUNTER — Ambulatory Visit (INDEPENDENT_AMBULATORY_CARE_PROVIDER_SITE_OTHER): Payer: Medicare Other | Admitting: Podiatry

## 2021-12-28 DIAGNOSIS — M79675 Pain in left toe(s): Secondary | ICD-10-CM | POA: Diagnosis not present

## 2021-12-28 DIAGNOSIS — M79674 Pain in right toe(s): Secondary | ICD-10-CM | POA: Diagnosis not present

## 2021-12-28 DIAGNOSIS — B351 Tinea unguium: Secondary | ICD-10-CM | POA: Diagnosis not present

## 2021-12-28 NOTE — Progress Notes (Signed)
?  Subjective:  ?Patient ID: Duane Park, male    DOB: April 02, 1932,  MRN: [id number] ? ?Duane Park presents to clinic today for painful elongated mycotic toenails 1-5 bilaterally which are tender when wearing enclosed shoe gear. Pain is relieved with periodic professional debridement. ? ?New problem(s): None.  ? ?PCP is Greig Right, MD , and last visit was November 07, 2021. ? ?His wife is present during today's visit. She states the epsom salt soaks helped the right great toe. ? ?No Known Allergies ? ?Review of Systems: Negative except as noted in the HPI. ? ?Objective: No changes noted in today's physical examination. ? ?Duane Park is a pleasant 88 y.o. male WD, WN in NAD. AAO x 3. ? ?There were no vitals filed for this visit. ? ?Vascular Examination:  ?CFT <3 seconds b/l LE. Faintly palpable pedal pulses b/l LE. Pedal hair absent b/l LE. Skin temperature gradient WNL b/l. No pain with calf compression b/l. No edema b/l LE. No cyanosis or clubbing noted b/l LE. ? ?Dermatological Examination: ?Pedal integument with normal turgor, texture and tone b/l LE. No open wounds b/l. No interdigital macerations b/l. Toenails 1-5 bilaterally elongated, thickened, discolored with subungual debris. +Tenderness with dorsal palpation of nailplates. No hyperkeratotic or porokeratotic lesions present. Incurvated nailplate medial border(s) R hallux.  Nail border hypertrophy minimal. There is tenderness to palpation. Sign(s) of infection: no clinical signs of infection noted on examination today. ? ?Musculoskeletal: ?Normal muscle strength 5/5 to all lower extremity muscle groups bilaterally. HAV with bunion deformity noted b/l LE.Marland Kitchen No pain, crepitus or joint limitation noted with ROM b/l LE.  Patient ambulates independently without assistive aids. ? ?Neurological: ?Protective sensation intact 5/5 intact bilaterally with 10g monofilament b/l. Vibratory sensation intact b/l. ? ?Assessment/Plan: ?1. Pain due to onychomycosis  of toenails of both feet   ?-Patient was evaluated and treated. All patient's and/or POA's questions/concerns answered on today's visit. ?-Mycotic toenails 1-5 bilaterally were debrided in length and girth with sterile nail nippers and dremel without incident. ?-Offending nail border debrided and curretaged right great toe utilizing sterile nail nipper and currette. Border(s) cleansed with alcohol and TAO applied. Patient instructed to apply triple antibiotic ointment  to right great toe once daily for 7 days. ?-Patient/POA to call should there be question/concern in the interim.  ? ?Return in about 3 months (around 03/29/2022). ? ?Marzetta Board, DPM  ?

## 2022-01-03 DIAGNOSIS — M25511 Pain in right shoulder: Secondary | ICD-10-CM | POA: Diagnosis not present

## 2022-01-08 DIAGNOSIS — H532 Diplopia: Secondary | ICD-10-CM | POA: Diagnosis not present

## 2022-01-08 DIAGNOSIS — H524 Presbyopia: Secondary | ICD-10-CM | POA: Diagnosis not present

## 2022-01-15 DIAGNOSIS — M25511 Pain in right shoulder: Secondary | ICD-10-CM | POA: Diagnosis not present

## 2022-01-22 DIAGNOSIS — H9209 Otalgia, unspecified ear: Secondary | ICD-10-CM | POA: Diagnosis not present

## 2022-01-22 DIAGNOSIS — H6123 Impacted cerumen, bilateral: Secondary | ICD-10-CM | POA: Diagnosis not present

## 2022-02-07 DIAGNOSIS — E782 Mixed hyperlipidemia: Secondary | ICD-10-CM | POA: Diagnosis not present

## 2022-02-07 DIAGNOSIS — K219 Gastro-esophageal reflux disease without esophagitis: Secondary | ICD-10-CM | POA: Diagnosis not present

## 2022-02-07 DIAGNOSIS — I10 Essential (primary) hypertension: Secondary | ICD-10-CM | POA: Diagnosis not present

## 2022-02-12 DIAGNOSIS — Z8546 Personal history of malignant neoplasm of prostate: Secondary | ICD-10-CM | POA: Diagnosis not present

## 2022-02-19 DIAGNOSIS — N401 Enlarged prostate with lower urinary tract symptoms: Secondary | ICD-10-CM | POA: Diagnosis not present

## 2022-02-19 DIAGNOSIS — R351 Nocturia: Secondary | ICD-10-CM | POA: Diagnosis not present

## 2022-02-19 DIAGNOSIS — Z8546 Personal history of malignant neoplasm of prostate: Secondary | ICD-10-CM | POA: Diagnosis not present

## 2022-03-01 DIAGNOSIS — Z6830 Body mass index (BMI) 30.0-30.9, adult: Secondary | ICD-10-CM | POA: Diagnosis not present

## 2022-03-01 DIAGNOSIS — I10 Essential (primary) hypertension: Secondary | ICD-10-CM | POA: Diagnosis not present

## 2022-03-01 DIAGNOSIS — G4700 Insomnia, unspecified: Secondary | ICD-10-CM | POA: Diagnosis not present

## 2022-03-01 DIAGNOSIS — E669 Obesity, unspecified: Secondary | ICD-10-CM | POA: Diagnosis not present

## 2022-03-01 DIAGNOSIS — R053 Chronic cough: Secondary | ICD-10-CM | POA: Diagnosis not present

## 2022-03-01 DIAGNOSIS — R4181 Age-related cognitive decline: Secondary | ICD-10-CM | POA: Diagnosis not present

## 2022-03-01 DIAGNOSIS — M5116 Intervertebral disc disorders with radiculopathy, lumbar region: Secondary | ICD-10-CM | POA: Diagnosis not present

## 2022-03-26 DIAGNOSIS — M5116 Intervertebral disc disorders with radiculopathy, lumbar region: Secondary | ICD-10-CM | POA: Diagnosis not present

## 2022-03-26 DIAGNOSIS — M5416 Radiculopathy, lumbar region: Secondary | ICD-10-CM | POA: Diagnosis not present

## 2022-03-29 ENCOUNTER — Ambulatory Visit (INDEPENDENT_AMBULATORY_CARE_PROVIDER_SITE_OTHER): Payer: Medicare Other | Admitting: Podiatry

## 2022-03-29 ENCOUNTER — Encounter: Payer: Self-pay | Admitting: Podiatry

## 2022-03-29 DIAGNOSIS — B351 Tinea unguium: Secondary | ICD-10-CM | POA: Diagnosis not present

## 2022-03-29 DIAGNOSIS — M79674 Pain in right toe(s): Secondary | ICD-10-CM | POA: Diagnosis not present

## 2022-03-29 DIAGNOSIS — M79675 Pain in left toe(s): Secondary | ICD-10-CM | POA: Diagnosis not present

## 2022-04-05 NOTE — Progress Notes (Signed)
  Subjective:  Patient ID: Duane Park, male    DOB: [birth year],  MRN: [id number]  Duane Park presents to clinic today for painful thick toenails that are difficult to trim. Pain interferes with ambulation. Aggravating factors include wearing enclosed shoe gear. Pain is relieved with periodic professional debridement.  Patient is accompanied by his wife on today's visit.  New problem(s): None.   PCP is Greig Right, MD , and last visit was  November 07, 2021  No Known Allergies  Review of Systems: Negative except as noted in the HPI.  Objective: Duane Park is a pleasant 88 y.o. male WD, WN in NAD. AAO x 3.  There were no vitals filed for this visit.  Vascular Examination:  CFT <3 seconds b/l LE. Faintly palpable pedal pulses b/l LE. Pedal hair absent b/l LE. Skin temperature gradient WNL b/l. No pain with calf compression b/l. No edema b/l LE. No cyanosis or clubbing noted b/l LE.  Dermatological Examination: Pedal integument with normal turgor, texture and tone b/l LE. No open wounds b/l. No interdigital macerations b/l. Toenails 1-5 bilaterally elongated, thickened, discolored with subungual debris. +Tenderness with dorsal palpation of nailplates. No hyperkeratotic or porokeratotic lesions present.   Musculoskeletal: Normal muscle strength 5/5 to all lower extremity muscle groups bilaterally. HAV with bunion deformity noted b/l LE.Marland Kitchen No pain, crepitus or joint limitation noted with ROM b/l LE.  Patient ambulates independently without assistive aids.  Neurological: Protective sensation intact 5/5 intact bilaterally with 10g monofilament b/l. Vibratory sensation intact b/l.  Assessment/Plan: 1. Pain due to onychomycosis of toenails of both feet      -Examined patient. -No new findings. No new orders. -Patient to continue soft, supportive shoe gear daily. -Toenails 1-5 b/l were debrided in length and girth with sterile nail nippers and dremel without iatrogenic  bleeding.  -Patient/POA to call should there be question/concern in the interim.   Return in about 3 months (around 06/29/2022).  Marzetta Board, DPM

## 2022-04-11 ENCOUNTER — Ambulatory Visit (INDEPENDENT_AMBULATORY_CARE_PROVIDER_SITE_OTHER): Payer: Medicare Other | Admitting: Internal Medicine

## 2022-04-11 ENCOUNTER — Encounter: Payer: Self-pay | Admitting: Internal Medicine

## 2022-04-11 VITALS — BP 111/62 | HR 85 | Ht 71.0 in | Wt 207.0 lb

## 2022-04-11 DIAGNOSIS — I358 Other nonrheumatic aortic valve disorders: Secondary | ICD-10-CM | POA: Diagnosis not present

## 2022-04-11 DIAGNOSIS — N1831 Chronic kidney disease, stage 3a: Secondary | ICD-10-CM | POA: Diagnosis not present

## 2022-04-11 DIAGNOSIS — I10 Essential (primary) hypertension: Secondary | ICD-10-CM

## 2022-04-11 DIAGNOSIS — E785 Hyperlipidemia, unspecified: Secondary | ICD-10-CM | POA: Diagnosis not present

## 2022-04-11 NOTE — Patient Instructions (Signed)
Medication Instructions:  ?Your physician recommends that you continue on your current medications as directed. Please refer to the Current Medication list given to you today. ? ?*If you need a refill on your cardiac medications before your next appointment, please call your pharmacy* ? ? ?Lab Work: ?NONE ?If you have labs (blood work) drawn today and your tests are completely normal, you will receive your results only by: ?MyChart Message (if you have MyChart) OR ?A paper copy in the mail ?If you have any lab test that is abnormal or we need to change your treatment, we will call you to review the results. ? ? ?Testing/Procedures: ?NONE ? ? ?Follow-Up: ?At [facility], you and your health needs are our priority.  As part of our continuing mission to provide you with exceptional heart care, we have created designated Provider Care Teams.  These Care Teams include your primary Cardiologist (physician) and Advanced Practice Providers (APPs -  Physician Assistants and Nurse Practitioners) who all work together to provide you with the care you need, when you need it. ? ?We recommend signing up for the patient portal called "MyChart".  Sign up information is provided on this After Visit Summary.  MyChart is used to connect with patients for Virtual Visits (Telemedicine).  Patients are able to view lab/test results, encounter notes, upcoming appointments, etc.  Non-urgent messages can be sent to your provider as well.   ?To learn more about what you can do with MyChart, go to [email or web address].   ? ?Your next appointment:   ?1 year(s) ? ?The format for your next appointment:   ?In Person ? ?Provider:   ?Mahesh A Chandrasekhar, MD   ? ? ?Important Information About Sugar ? ? ? ? ?  ?

## 2022-04-11 NOTE — Progress Notes (Signed)
Cardiology Office Note:    Date:  04/11/2022   ID:  Duane Park, Duane Park [birth year], MRN [id number]  PCP:  Greig Right, MD   [place]  Cardiologist:  Werner Lean, MD  Advanced Practice Provider:  No care team member to display Electrophysiologist:  None      CC: Follow up for CP eval  History of Present Illness:    Duane Park is a 88 y.o. male with a hx of prior stroke, HTN, HLD, COPD who presents after ED evaluation for chest pain.   2022: Normal echo (difficult study for WMAs)  negative stress test.    Patient notes that he is doing not too much.   Since last visit notes that he is fairly sedentary- notes a long history or arthralgias. Is thinking about R shoulder surgery There are no interval hospital/ED visit.    No chest pain or pressure.  No SOB/DOE and no PND/Orthopnea.  No weight gain or leg swelling, wearing his compressing stockings.  No palpitations or syncope.  Past Medical History:  Diagnosis Date   Chronic back pain    COPD (chronic obstructive pulmonary disease) ([place])    DNR (do not resuscitate) 10/22/2020   Hypercholesteremia    Hypertension    Stroke [facility])     Past Surgical History:  Procedure Laterality Date   BACK SURGERY     JOINT REPLACEMENT     arthroscopic surgery rt side    Current Medications: Current Meds  Medication Sig   acetaminophen (TYLENOL) 325 MG tablet Take 1-2 tablets (325-650 mg total) by mouth every 4 (four) hours as needed for mild pain.   albuterol (2.5 MG/3ML) 0.083% NEBU 3 mL, albuterol (5 MG/ML) 0.5% NEBU 0.5 mL Inhale 5 mg into the lungs daily as needed (shortness of breath).   albuterol (VENTOLIN HFA) 108 (90 Base) MCG/ACT inhaler Inhale 1-2 puffs into the lungs every 6 (six) hours as needed for wheezing or shortness of breath.   amLODipine (NORVASC) 5 MG tablet Take 5 mg by mouth daily.   aspirin EC 81 MG EC tablet Take 1 tablet (81 mg total) by mouth daily.   atorvastatin  (LIPITOR) 80 MG tablet Take 1 tablet (80 mg total) by mouth daily at 6 PM.   BREZTRI AEROSPHERE 160-9-4.8 MCG/ACT AERO SMARTSIG:2 Puff(s) By Mouth Morning-Evening   Cetirizine HCl (ZYRTEC PO) Take 10 mg by mouth.   cyanocobalamin (VITAMIN B12) 1000 MCG tablet Take 1,000 mcg by mouth daily.   fluticasone (FLONASE) 50 MCG/ACT nasal spray Place into both nostrils.   isosorbide mononitrate (IMDUR) 30 MG 24 hr tablet TAKE 1 TABLET(30 MG) BY MOUTH DAILY   Multiple Vitamins-Minerals (MENS 50+ MULTI VITAMIN/MIN PO) Take by mouth daily at 6 (six) AM.   olmesartan-hydrochlorothiazide (BENICAR HCT) 40-12.5 MG tablet Take 1 tablet by mouth daily.   pantoprazole (PROTONIX) 40 MG tablet Take 2 tablets (80 mg total) by mouth daily.   polycarbophil (FIBERCON) 625 MG tablet Take 625 mg by mouth daily.   traMADol (ULTRAM) 50 MG tablet Take 100 mg by mouth daily.   traZODone (DESYREL) 50 MG tablet Take 50 mg by mouth at bedtime.     Allergies:   Patient has no known allergies.   Social History   Socioeconomic History   Marital status: Married    Spouse name: Not on file   Number of children: Not on file   Years of education: Not on file   Highest education level: Not  on file  Occupational History   Not on file  Tobacco Use   Smoking status: Never   Smokeless tobacco: Never  Substance and Sexual Activity   Alcohol use: Never   Drug use: Never   Sexual activity: Not on file  Other Topics Concern   Not on file  Social History Narrative   Not on file   Social Determinants of Health   Financial Resource Strain: Not on file  Food Insecurity: Not on file  Transportation Needs: Not on file  Physical Activity: Not on file  Stress: Not on file  Social Connections: Not on file    Social: Comes with Daughter, wife has some health problems   Family History: The patient's family history includes Heart disease in his father; Lung disease in his mother.  ROS:   Please see the history of present  illness.     All other systems reviewed and are negative.  EKGs/Labs/Other Studies Reviewed:    The following studies were reviewed today:  EKG:   04/11/22: SR rate 85 10/24/20: Sinus Tach rate 106 RAD and LAFB  Transthoracic Echocardiogram: Date: 10/22/20 Results:  1. Left ventricular ejection fraction, by estimation, is 55 to 60%. The  left ventricle has normal function. Left ventricular endocardial border  not optimally defined to evaluate regional wall motion. There is mild  concentric left ventricular  hypertrophy. Left ventricular diastolic parameters are consistent with  Grade I diastolic dysfunction (impaired relaxation).   2. Right ventricular systolic function is normal. The right ventricular  size is normal. There is normal pulmonary artery systolic pressure.   3. The mitral valve is grossly normal. No evidence of mitral valve  regurgitation. No evidence of mitral stenosis.   4. The aortic valve is calcified. There is moderate calcification of the  aortic valve. Aortic valve regurgitation is not visualized. No aortic  stenosis is present.   5. There is borderline dilatation of the ascending aorta, measuring 38  mm.   Comparison(s): A prior study was performed on 04/13/2018. Study is more  technically difficult: no wall motion abnormalities in views obtained; but  not all walls are well assessed. Similar to prior.   NM Stress Testing: Date: 11/30/20 Results: Nuclear stress EF: 66%. The left ventricular ejection fraction is hyperdynamic (>65%). There was no ST segment deviation noted during stress. This is a low risk study. No evidence of ischemia or previous infarction. The study is normal.    Recent Labs: No results found for requested labs within last 365 days.  Recent Lipid Panel    Component Value Date/Time   CHOL 110 10/22/2020 0953   TRIG 68 10/22/2020 0953   HDL 34 (L) 10/22/2020 0953   CHOLHDL 3.2 10/22/2020 0953   VLDL 14 10/22/2020 0953   LDLCALC 62  10/22/2020 0953   Physical Exam:    VS:  BP 111/62   Pulse 85   Ht '5\' 11"'$  (1.803 m)   Wt 207 lb (93.9 kg)   SpO2 97%   BMI 28.87 kg/m     Wt Readings from Last 3 Encounters:  04/11/22 207 lb (93.9 kg)  09/15/21 201 lb 12.8 oz (91.5 kg)  07/19/21 199 lb (90.3 kg)    Gen: No distress   Neck: No JVD Cardiac: No Rubs or Gallops, soft systolic Murmur, RRR +2 radial pulses Respiratory: Clear to auscultation bilaterally, normal effort, normal  respiratory rate GI: Soft, nontender, non-distended  MS: No  edema (compression stocking on) moves all extremities Neuro:  At time of evaluation, alert and oriented to person/place/time/situation  Psych: Normal affect, patient feels well   ASSESSMENT:    1. Stage 3a chronic kidney disease ([place])   2. Essential hypertension   3. Hyperlipidemia, unspecified hyperlipidemia type   4. Aortic valve sclerosis     PLAN:    HTN Aortic atherosclerosis and HLD CKD Stage IIIA COPD - ASA mg PO Daily reasonable, he may have some underlying CAD that resolved on IMDUR - continue Imdur 30 mg - LDL 57 on atorivastatin 80 mg PO daily - presently to continue olmesartan and HCTZ 40-12.5 mg; if we have worsening LE edema we will stop HCTZ and start lasix 20 mg - continue compression stockings - BP controled on this an norvasc  One year f/u unless new sx; will repeat echo at that time for aortic sclerosis      Medication Adjustments/Labs and Tests Ordered: Current medicines are reviewed at length with the patient today.  Concerns regarding medicines are outlined above.  Orders Placed This Encounter  Procedures   EKG 12-Lead    No orders of the defined types were placed in this encounter.    Patient Instructions  Medication Instructions:  Your physician recommends that you continue on your current medications as directed. Please refer to the Current Medication list given to you today.  *If you need a refill on your cardiac medications  before your next appointment, please call your pharmacy*   Lab Work: NONE If you have labs (blood work) drawn today and your tests are completely normal, you will receive your results only by: Muse (if you have MyChart) OR A paper copy in the mail If you have any lab test that is abnormal or we need to change your treatment, we will call you to review the results.   Testing/Procedures: NONE   Follow-Up: At [facility], you and your health needs are our priority.  As part of our continuing mission to provide you with exceptional heart care, we have created designated Provider Care Teams.  These Care Teams include your primary Cardiologist (physician) and Advanced Practice Providers (APPs -  Physician Assistants and Nurse Practitioners) who all work together to provide you with the care you need, when you need it.  We recommend signing up for the patient portal called "MyChart".  Sign up information is provided on this After Visit Summary.  MyChart is used to connect with patients for Virtual Visits (Telemedicine).  Patients are able to view lab/test results, encounter notes, upcoming appointments, etc.  Non-urgent messages can be sent to your provider as well.   To learn more about what you can do with MyChart, go to [email or web address].    Your next appointment:   1 year(s)  The format for your next appointment:   In Person  Provider:   Werner Lean, MD    Important Information About Sugar         Signed, Werner Lean, MD  04/11/2022 3:00 PM    [place]

## 2022-04-17 DIAGNOSIS — J0100 Acute maxillary sinusitis, unspecified: Secondary | ICD-10-CM | POA: Diagnosis not present

## 2022-04-23 ENCOUNTER — Other Ambulatory Visit: Payer: Self-pay | Admitting: Internal Medicine

## 2022-05-29 DIAGNOSIS — M150 Primary generalized (osteo)arthritis: Secondary | ICD-10-CM | POA: Diagnosis not present

## 2022-05-29 DIAGNOSIS — I10 Essential (primary) hypertension: Secondary | ICD-10-CM | POA: Diagnosis not present

## 2022-05-29 DIAGNOSIS — M25562 Pain in left knee: Secondary | ICD-10-CM | POA: Diagnosis not present

## 2022-05-29 DIAGNOSIS — E782 Mixed hyperlipidemia: Secondary | ICD-10-CM | POA: Diagnosis not present

## 2022-06-04 DIAGNOSIS — M25562 Pain in left knee: Secondary | ICD-10-CM | POA: Diagnosis not present

## 2022-06-04 DIAGNOSIS — M25572 Pain in left ankle and joints of left foot: Secondary | ICD-10-CM | POA: Diagnosis not present

## 2022-06-08 ENCOUNTER — Encounter (HOSPITAL_COMMUNITY): Payer: Self-pay

## 2022-06-08 ENCOUNTER — Other Ambulatory Visit: Payer: Self-pay

## 2022-06-08 ENCOUNTER — Emergency Department (HOSPITAL_COMMUNITY): Payer: Medicare Other

## 2022-06-08 ENCOUNTER — Emergency Department (HOSPITAL_COMMUNITY)
Admission: EM | Admit: 2022-06-08 | Discharge: 2022-06-08 | Disposition: A | Discharge status: Home / Self Care | Payer: Medicare Other | Attending: Emergency Medicine | Admitting: Emergency Medicine

## 2022-06-08 DIAGNOSIS — Z79899 Other long term (current) drug therapy: Secondary | ICD-10-CM | POA: Diagnosis not present

## 2022-06-08 DIAGNOSIS — M25562 Pain in left knee: Secondary | ICD-10-CM | POA: Diagnosis not present

## 2022-06-08 DIAGNOSIS — M549 Dorsalgia, unspecified: Secondary | ICD-10-CM | POA: Diagnosis not present

## 2022-06-08 DIAGNOSIS — M79602 Pain in left arm: Secondary | ICD-10-CM | POA: Insufficient documentation

## 2022-06-08 DIAGNOSIS — R0789 Other chest pain: Secondary | ICD-10-CM | POA: Diagnosis not present

## 2022-06-08 DIAGNOSIS — M25462 Effusion, left knee: Secondary | ICD-10-CM | POA: Diagnosis not present

## 2022-06-08 DIAGNOSIS — M545 Low back pain, unspecified: Secondary | ICD-10-CM | POA: Diagnosis not present

## 2022-06-08 DIAGNOSIS — M25532 Pain in left wrist: Secondary | ICD-10-CM | POA: Diagnosis not present

## 2022-06-08 DIAGNOSIS — M79603 Pain in arm, unspecified: Secondary | ICD-10-CM | POA: Diagnosis not present

## 2022-06-08 DIAGNOSIS — Z7982 Long term (current) use of aspirin: Secondary | ICD-10-CM | POA: Diagnosis not present

## 2022-06-08 DIAGNOSIS — R079 Chest pain, unspecified: Secondary | ICD-10-CM | POA: Diagnosis not present

## 2022-06-08 DIAGNOSIS — R000 Tachycardia, unspecified: Secondary | ICD-10-CM | POA: Diagnosis not present

## 2022-06-08 DIAGNOSIS — D72829 Elevated white blood cell count, unspecified: Secondary | ICD-10-CM | POA: Insufficient documentation

## 2022-06-08 LAB — CBC
HCT: 42.8 % (ref 39.0–52.0)
Hemoglobin: 14.8 g/dL (ref 13.0–17.0)
MCH: 37.8 pg — ABNORMAL HIGH (ref 26.0–34.0)
MCHC: 34.6 g/dL (ref 30.0–36.0)
MCV: 109.2 fL — ABNORMAL HIGH (ref 80.0–100.0)
Platelets: 189 10*3/uL (ref 150–400)
RBC: 3.92 MIL/uL — ABNORMAL LOW (ref 4.22–5.81)
RDW: 12.3 % (ref 11.5–15.5)
WBC: 12.3 10*3/uL — ABNORMAL HIGH (ref 4.0–10.5)
nRBC: 0 % (ref 0.0–0.2)

## 2022-06-08 LAB — BASIC METABOLIC PANEL
Anion gap: 6 (ref 5–15)
BUN: 37 mg/dL — ABNORMAL HIGH (ref 8–23)
CO2: 25 mmol/L (ref 22–32)
Calcium: 8.8 mg/dL — ABNORMAL LOW (ref 8.9–10.3)
Chloride: 107 mmol/L (ref 98–111)
Creatinine, Ser: 1.29 mg/dL — ABNORMAL HIGH (ref 0.61–1.24)
GFR, Estimated: 53 mL/min — ABNORMAL LOW (ref >60)
Glucose, Bld: 99 mg/dL (ref 70–99)
Potassium: 4.1 mmol/L (ref 3.5–5.1)
Sodium: 138 mmol/L (ref 135–145)

## 2022-06-08 LAB — TROPONIN I (HIGH SENSITIVITY)
Troponin I (High Sensitivity): 34 ng/L — ABNORMAL HIGH (ref <18)
Troponin I (High Sensitivity): 49 ng/L — ABNORMAL HIGH (ref <18)

## 2022-06-08 LAB — MAGNESIUM: Magnesium: 1.8 mg/dL (ref 1.7–2.4)

## 2022-06-08 LAB — BRAIN NATRIURETIC PEPTIDE: B Natriuretic Peptide: 72.6 pg/mL (ref 0.0–100.0)

## 2022-06-08 MED ORDER — FENTANYL CITRATE PF 50 MCG/ML IJ SOSY
50.0000 ug | PREFILLED_SYRINGE | Freq: Once | INTRAMUSCULAR | Status: AC
  Administered 2022-06-08: 50 ug via INTRAVENOUS
  Filled 2022-06-08: qty 1

## 2022-06-08 MED ORDER — NITROGLYCERIN 0.4 MG SL SUBL
0.4000 mg | SUBLINGUAL_TABLET | SUBLINGUAL | Status: DC | PRN
  Administered 2022-06-08: 0.4 mg via SUBLINGUAL
  Filled 2022-06-08: qty 1

## 2022-06-08 NOTE — ED Provider Notes (Signed)
[facility] EMERGENCY DEPARTMENT Provider Note   CSN: [id number] Arrival date & time: 06/08/22  1052     History {Add pertinent medical, surgical, social history, OB history to HPI:1} No chief complaint on file.   Duane Park is a 88 y.o. male.  HPI     Home Medications Prior to Admission medications   Medication Sig Start Date End Date Taking? Authorizing Provider  acetaminophen (TYLENOL) 325 MG tablet Take 1-2 tablets (325-650 mg total) by mouth every 4 (four) hours as needed for mild pain. 04/22/18   Angiulli, Lavon Paganini, PA-C  albuterol (2.5 MG/3ML) 0.083% NEBU 3 mL, albuterol (5 MG/ML) 0.5% NEBU 0.5 mL Inhale 5 mg into the lungs daily as needed (shortness of breath).    [provider]  albuterol (VENTOLIN HFA) 108 (90 Base) MCG/ACT inhaler Inhale 1-2 puffs into the lungs every 6 (six) hours as needed for wheezing or shortness of breath.    [provider]  amLODipine (NORVASC) 5 MG tablet Take 5 mg by mouth daily. 05/12/21   [provider]  aspirin EC 81 MG EC tablet Take 1 tablet (81 mg total) by mouth daily. 04/15/18   Ledell Noss, MD  atorvastatin (LIPITOR) 80 MG tablet Take 1 tablet (80 mg total) by mouth daily at 6 PM. 04/22/18   Angiulli, Lavon Paganini, PA-C  BREZTRI AEROSPHERE 160-9-4.8 MCG/ACT AERO SMARTSIG:2 Puff(s) By Mouth Morning-Evening 05/12/21   [provider]  Cetirizine HCl (ZYRTEC PO) Take 10 mg by mouth.    [provider]  cyanocobalamin (VITAMIN B12) 1000 MCG tablet Take 1,000 mcg by mouth daily.    [provider]  fluticasone (FLONASE) 50 MCG/ACT nasal spray Place into both nostrils. 12/18/21   [provider]  isosorbide mononitrate (IMDUR) 30 MG 24 hr tablet TAKE 1 TABLET(30 MG) BY MOUTH DAILY 04/23/22   Chandrasekhar, Mahesh A, MD  Multiple Vitamins-Minerals (MENS 50+ MULTI VITAMIN/MIN PO) Take by mouth daily at 6 (six) AM.    [provider]  olmesartan-hydrochlorothiazide  (BENICAR HCT) 40-12.5 MG tablet Take 1 tablet by mouth daily. 08/11/21   [provider]  pantoprazole (PROTONIX) 40 MG tablet Take 2 tablets (80 mg total) by mouth daily. 04/22/18   Angiulli, Lavon Paganini, PA-C  polycarbophil (FIBERCON) 625 MG tablet Take 625 mg by mouth daily.    [provider]  traMADol (ULTRAM) 50 MG tablet Take 100 mg by mouth daily. 10/02/20   [provider]  traZODone (DESYREL) 50 MG tablet Take 50 mg by mouth at bedtime. 03/01/22   [provider]      Allergies    Patient has no known allergies.    Review of Systems   Review of Systems  Physical Exam Updated Vital Signs BP (!) 147/88 (BP Location: Right Arm)   Pulse 100   Temp 97.6 F (36.4 C) (Oral)   Resp 17   SpO2 98%  Physical Exam  ED Results / Procedures / Treatments   Labs (all labs ordered are listed, but only abnormal results are displayed) Labs Reviewed  BASIC METABOLIC PANEL - Abnormal; Notable for the following components:      Result Value   BUN 37 (*)    Creatinine, Ser 1.29 (*)    Calcium 8.8 (*)    GFR, Estimated 53 (*)    All other components within normal limits  CBC - Abnormal; Notable for the following components:   WBC 12.3 (*)    RBC 3.92 (*)  MCV 109.2 (*)    MCH 37.8 (*)    All other components within normal limits  TROPONIN I (HIGH SENSITIVITY) - Abnormal; Notable for the following components:   Troponin I (High Sensitivity) 34 (*)    All other components within normal limits  BRAIN NATRIURETIC PEPTIDE  MAGNESIUM  TROPONIN I (HIGH SENSITIVITY)    EKG None  Radiology DG Chest 2 View  Result Date: 06/08/2022 CLINICAL DATA:  Chest pain. EXAM: CHEST - 2 VIEW COMPARISON:  July 04, 2021. FINDINGS: The heart size and mediastinal contours are within normal limits. Both lungs are clear. Stable elevated right hemidiaphragm is noted. The visualized skeletal structures are unremarkable. IMPRESSION: No active cardiopulmonary disease.  Electronically Signed   By: Marijo Conception M.D.   On: 06/08/2022 11:24    Procedures Procedures  {Document cardiac monitor, telemetry assessment procedure when appropriate:1}  Medications Ordered in ED Medications - No data to display  ED Course/ Medical Decision Making/ A&P Clinical Course as of 06/08/22 1536  Fri Jun 08, 2022  1535 EKG 12-Lead [JN]    Clinical Course User Index [JN] Yue Glasheen, [place], MD                           Medical Decision Making Amount and/or Complexity of Data Reviewed Labs: ordered. Radiology: ordered. ECG/medicine tests:  Decision-making details documented in ED Course.   Medical Decision Making  This patient is Presenting for Evaluation of ***, which {Range:23949} require a range of treatment options, and {MDMcomplaint:23950} a complaint that involves a {MDMlevelrisk:23951} risk of morbidity and mortality.  Arrived in ED by: POV*** EMS*** History obtained from: The patient***  Limitations in history: none***    At this time I am most concerned for ***. Also considering ***. Plan for ***    ***I interpreted the ECG. It reveals a sinus rhythm. The QTc, PR, and QRS are appropriate. There are no signs of acute ischemia or of significant electrical abnormalities. The ECG does not show a STEMI. There are no ST depressions. ***There are no T wave inversions. There is no evidence of a High-Grade Conduction Block.   Laboratory work-up significant for:   ***   Radiologic work-up was significant for:  ***   Interventions and Interval History: ***   Clinical Course as of 06/08/22 1536  Fri Jun 08, 2022  1535 EKG 12-Lead [JN]    Clinical Course User Index [JN] Jaidah Lomax, [place], MD       Consults:  *** Recommendations: ***   Decision rules/scores evaluated: ***   Additional documents reviewed:     Disposition: Due to the patients current presenting symptoms, physical exam findings, and the workup stated above, it is thought that  the etiology of the patients current presentation is ***    ***ADMIT: Patient is thought to require admission for ***. Patient will be admitted to *** service. Please see in patient provider note for additional treatment plan details.   ***Discharge: Patient is felt to be medically appropriate for discharge at this time. Patient was informed of all pertinent physical exam, laboratory, and imaging findings. Patients suspected etiology of their symptom presentation was discussed with the patient and all questions were answered. Patient was instructed to follow up with their primary care doctor in *** days for re-evaluation. Patient was given strict return precautions.   ***Handoff: At the time of signout, the patients *** had not yet been completed. Handoff was provided to Dr. Marland Kitchen ,  please see their note for additional treatment plan details.   The plan for this patient was discussed with Dr. ***, who voiced agreement and who oversaw evaluation and treatment of this patient.     Clinical Complexity  A medically appropriate history, review of systems, and physical exam was performed.   I personally reviewed the lab and imaging studies discussed above.   MDM generated using voice dictation software and may contain dictation errors. Please contact me for any clarification or with any questions.         {Document critical care time when appropriate:1} {Document review of labs and clinical decision tools ie heart score, Chads2Vasc2 etc:1}  {Document your independent review of radiology images, and any outside records:1} {Document your discussion with family members, caretakers, and with consultants:1} {Document social determinants of health affecting pt's care:1} {Document your decision making why or why not admission, treatments were needed:1} Final Clinical Impression(s) / ED Diagnoses Final diagnoses:  None    Rx / DC Orders ED Discharge Orders     None

## 2022-06-08 NOTE — ED Provider Triage Note (Signed)
Emergency Medicine Provider Triage Evaluation Note  Duane Park , a 88 y.o. male  was evaluated in triage.  Pt complains of chest pain that occurred immediately following left knee arthrocentesis at Ortho office.  Reports his chest pain has resolved since.  Currently just complaining of left knee pain and left arm pain.  Left arm pain it has been ongoing for about a week.  He has swelling to bilateral lower extremities.  He states this is chronic.  However he denies history of CHF.  Also denies history of CAD.  Review of Systems  Positive: As above Negative: As above  Physical Exam  BP 137/75 (BP Location: Right Arm)   Pulse 99   Temp 98 F (36.7 C) (Oral)   Resp (!) 24   SpO2 97%  Gen:   Awake, no distress   Resp:  Normal effort  MSK:   Moves extremities without difficulty  Other:    Medical Decision Making  Medically screening exam initiated at 11:15 AM.  Appropriate orders placed.  Duane Park was informed that the remainder of the evaluation will be completed by another provider, this initial triage assessment does not replace that evaluation, and the importance of remaining in the ED until their evaluation is complete.     Evlyn Courier, PA-C 06/08/22 1117

## 2022-06-08 NOTE — Discharge Instructions (Signed)
Please follow-up with your cardiology doctor to discuss additional work-up for your chest pain.  Please return to emergency department if you develop any new or worsening chest pain, worsening shortness of breath, difficulty breathing, altered consciousness or any other concerning symptoms.

## 2022-06-08 NOTE — ED Triage Notes (Signed)
Patient arrived from ortho office after experiencing chest heaviness following knee aspiration and injection today. Patient alert and oriented and denies any chest heaviness or pain on arrival

## 2022-06-13 ENCOUNTER — Ambulatory Visit: Payer: Medicare Other | Attending: Internal Medicine | Admitting: Internal Medicine

## 2022-06-13 NOTE — Progress Notes (Deleted)
Cardiology Office Note:    Date:  06/13/2022   ID:  Duane Park, DOB Dec 19, 1931, MRN [id number]  PCP:  Greig Right, MD   [place]  Cardiologist:  Werner Lean, MD  Advanced Practice Provider:  No care team member to display Electrophysiologist:  None      CC: Follow up for CP eval  History of Present Illness:    Duane Park is a 88 y.o. male with a hx of prior stroke, HTN, HLD, COPD who presents after ED evaluation for chest pain.   2022: Normal echo (difficult study for WMAs)  negative stress test.   2023: Had ED visit for new chest.  Work up benign.  Added back for urgent visit.  Patient notes that he is doing ***.   Since last visit notes *** .  No chest pain or pressure ***.  No SOB/DOE*** and no PND/Orthopnea***.  No weight gain or leg swelling***.  No palpitations or syncope ***.  Ambulatory blood pressure ***.   Past Medical History:  Diagnosis Date   Chronic back pain    COPD (chronic obstructive pulmonary disease) ([place])    DNR (do not resuscitate) 10/22/2020   Hypercholesteremia    Hypertension    Stroke [facility])     Past Surgical History:  Procedure Laterality Date   BACK SURGERY     JOINT REPLACEMENT     arthroscopic surgery rt side    Current Medications: No outpatient medications have been marked as taking for the 06/13/22 encounter (Appointment) with Werner Lean, MD.     Allergies:   Patient has no known allergies.   Social History   Socioeconomic History   Marital status: Married    Spouse name: Not on file   Number of children: Not on file   Years of education: Not on file   Highest education level: Not on file  Occupational History   Not on file  Tobacco Use   Smoking status: Never   Smokeless tobacco: Never  Substance and Sexual Activity   Alcohol use: Never   Drug use: Never   Sexual activity: Not on file  Other Topics Concern   Not on file  Social History Narrative   Not on  file   Social Determinants of Health   Financial Resource Strain: Not on file  Food Insecurity: Not on file  Transportation Needs: Not on file  Physical Activity: Not on file  Stress: Not on file  Social Connections: Not on file    Social: Comes with Daughter, wife has some health problems   Family History: The patient's family history includes Heart disease in his father; Lung disease in his mother.  ROS:   Please see the history of present illness.     All other systems reviewed and are negative.  EKGs/Labs/Other Studies Reviewed:    The following studies were reviewed today:  EKG:   04/11/22: SR rate 85 10/24/20: Sinus Tach rate 106 RAD and LAFB  Transthoracic Echocardiogram: Date: 10/22/20 Results:  1. Left ventricular ejection fraction, by estimation, is 55 to 60%. The  left ventricle has normal function. Left ventricular endocardial border  not optimally defined to evaluate regional wall motion. There is mild  concentric left ventricular  hypertrophy. Left ventricular diastolic parameters are consistent with  Grade I diastolic dysfunction (impaired relaxation).   2. Right ventricular systolic function is normal. The right ventricular  size is normal. There is normal pulmonary artery systolic pressure.  3. The mitral valve is grossly normal. No evidence of mitral valve  regurgitation. No evidence of mitral stenosis.   4. The aortic valve is calcified. There is moderate calcification of the  aortic valve. Aortic valve regurgitation is not visualized. No aortic  stenosis is present.   5. There is borderline dilatation of the ascending aorta, measuring 38  mm.   Comparison(s): A prior study was performed on 04/13/2018. Study is more  technically difficult: no wall motion abnormalities in views obtained; but  not all walls are well assessed. Similar to prior.   NM Stress Testing: Date: 11/30/20 Results: Nuclear stress EF: 66%. The left ventricular ejection fraction is  hyperdynamic (>65%). There was no ST segment deviation noted during stress. This is a low risk study. No evidence of ischemia or previous infarction. The study is normal.    Recent Labs: 06/08/2022: B Natriuretic Peptide 72.6; BUN 37; Creatinine, Ser 1.29; Hemoglobin 14.8; Magnesium 1.8; Platelets 189; Potassium 4.1; Sodium 138  Recent Lipid Panel    Component Value Date/Time   CHOL 110 10/22/2020 0953   TRIG 68 10/22/2020 0953   HDL 34 (L) 10/22/2020 0953   CHOLHDL 3.2 10/22/2020 0953   VLDL 14 10/22/2020 0953   LDLCALC 62 10/22/2020 0953   Physical Exam:    VS:  There were no vitals taken for this visit.    Wt Readings from Last 3 Encounters:  04/11/22 207 lb (93.9 kg)  09/15/21 201 lb 12.8 oz (91.5 kg)  07/19/21 199 lb (90.3 kg)    Gen: No distress   Neck: No JVD Cardiac: No Rubs or Gallops, soft systolic Murmur, RRR +2 radial pulses Respiratory: Clear to auscultation bilaterally, normal effort, normal  respiratory rate GI: Soft, nontender, non-distended  MS: No  edema (compression stocking on) moves all extremities Neuro:  At time of evaluation, alert and oriented to person/place/time/situation  Psych: Normal affect, patient feels well  ASSESSMENT:    No diagnosis found.  PLAN:    HTN Aortic atherosclerosis and HLD CKD Stage IIIA COPD - ASA mg PO Daily reasonable, he may have some underlying CAD that resolved on IMDUR - continue Imdur 30 mg - LDL 57 on atorivastatin 80 mg PO daily - presently to continue olmesartan and HCTZ 40-12.5 mg; if we have worsening LE edema we will stop HCTZ and start lasix 20 mg - continue compression stockings - BP controled on this an norvasc  Cardiac CT      Medication Adjustments/Labs and Tests Ordered: Current medicines are reviewed at length with the patient today.  Concerns regarding medicines are outlined above.  No orders of the defined types were placed in this encounter.   No orders of the defined types were  placed in this encounter.    There are no Patient Instructions on file for this visit.   Signed, Werner Lean, MD  06/13/2022 8:46 AM    [facility]

## 2022-06-20 ENCOUNTER — Other Ambulatory Visit (HOSPITAL_COMMUNITY): Payer: Self-pay | Admitting: Orthopedic Surgery

## 2022-06-20 ENCOUNTER — Ambulatory Visit (HOSPITAL_COMMUNITY)
Admission: RE | Admit: 2022-06-20 | Discharge: 2022-06-20 | Disposition: A | Discharge status: Home / Self Care | Payer: Medicare Other | Source: Ambulatory Visit | Attending: Orthopedic Surgery | Admitting: Orthopedic Surgery

## 2022-06-20 DIAGNOSIS — M25551 Pain in right hip: Secondary | ICD-10-CM | POA: Diagnosis not present

## 2022-06-20 DIAGNOSIS — M8438XA Stress fracture, other site, initial encounter for fracture: Secondary | ICD-10-CM | POA: Diagnosis not present

## 2022-06-20 DIAGNOSIS — M25552 Pain in left hip: Secondary | ICD-10-CM | POA: Diagnosis not present

## 2022-06-20 DIAGNOSIS — M545 Low back pain, unspecified: Secondary | ICD-10-CM | POA: Diagnosis not present

## 2022-06-20 DIAGNOSIS — S83242A Other tear of medial meniscus, current injury, left knee, initial encounter: Secondary | ICD-10-CM | POA: Diagnosis not present

## 2022-06-21 DIAGNOSIS — I69354 Hemiplegia and hemiparesis following cerebral infarction affecting left non-dominant side: Secondary | ICD-10-CM | POA: Diagnosis not present

## 2022-06-21 DIAGNOSIS — Z79891 Long term (current) use of opiate analgesic: Secondary | ICD-10-CM | POA: Diagnosis not present

## 2022-06-21 DIAGNOSIS — I77819 Aortic ectasia, unspecified site: Secondary | ICD-10-CM | POA: Diagnosis not present

## 2022-06-21 DIAGNOSIS — G589 Mononeuropathy, unspecified: Secondary | ICD-10-CM | POA: Diagnosis not present

## 2022-06-21 DIAGNOSIS — B029 Zoster without complications: Secondary | ICD-10-CM | POA: Diagnosis not present

## 2022-06-21 DIAGNOSIS — I700 Atherosclerosis of aorta: Secondary | ICD-10-CM | POA: Diagnosis not present

## 2022-06-21 DIAGNOSIS — N200 Calculus of kidney: Secondary | ICD-10-CM | POA: Diagnosis not present

## 2022-06-21 DIAGNOSIS — K5730 Diverticulosis of large intestine without perforation or abscess without bleeding: Secondary | ICD-10-CM | POA: Diagnosis not present

## 2022-06-21 DIAGNOSIS — N400 Enlarged prostate without lower urinary tract symptoms: Secondary | ICD-10-CM | POA: Diagnosis not present

## 2022-06-21 DIAGNOSIS — K2970 Gastritis, unspecified, without bleeding: Secondary | ICD-10-CM | POA: Diagnosis not present

## 2022-06-21 DIAGNOSIS — M47816 Spondylosis without myelopathy or radiculopathy, lumbar region: Secondary | ICD-10-CM | POA: Diagnosis not present

## 2022-06-21 DIAGNOSIS — Z86010 Personal history of colonic polyps: Secondary | ICD-10-CM | POA: Diagnosis not present

## 2022-06-21 DIAGNOSIS — M810 Age-related osteoporosis without current pathological fracture: Secondary | ICD-10-CM | POA: Diagnosis not present

## 2022-06-21 DIAGNOSIS — M150 Primary generalized (osteo)arthritis: Secondary | ICD-10-CM | POA: Diagnosis not present

## 2022-06-21 DIAGNOSIS — Z7982 Long term (current) use of aspirin: Secondary | ICD-10-CM | POA: Diagnosis not present

## 2022-06-21 DIAGNOSIS — E785 Hyperlipidemia, unspecified: Secondary | ICD-10-CM | POA: Diagnosis not present

## 2022-06-21 DIAGNOSIS — M8580 Other specified disorders of bone density and structure, unspecified site: Secondary | ICD-10-CM | POA: Diagnosis not present

## 2022-06-21 DIAGNOSIS — Z79899 Other long term (current) drug therapy: Secondary | ICD-10-CM | POA: Diagnosis not present

## 2022-06-21 DIAGNOSIS — K219 Gastro-esophageal reflux disease without esophagitis: Secondary | ICD-10-CM | POA: Diagnosis not present

## 2022-06-21 DIAGNOSIS — Z8546 Personal history of malignant neoplasm of prostate: Secondary | ICD-10-CM | POA: Diagnosis not present

## 2022-06-21 DIAGNOSIS — I10 Essential (primary) hypertension: Secondary | ICD-10-CM | POA: Diagnosis not present

## 2022-06-25 NOTE — Progress Notes (Unsigned)
Duane Park Initial Visit:  Patient Care Team: Greig Right, MD as PCP - General (Family Medicine) Werner Lean, MD as PCP - Cardiology (Cardiology)  CHIEF COMPLAINTS/PURPOSE OF CONSULTATION:  Oncology History   No history exists.    HISTORY OF PRESENTING ILLNESS: Duane Park 88 y.o. 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  February 12, 2022: PSA 0.27  June 04, 2022: Presented to PCP with pain in left knee  June 08, 2022: Presents to orthopedics following a 2-week history of sudden increase in left knee pain but continued to worsen to the point where it became excruciating.  He previously seen his PCP and was treated with colchicine and prednisone for possible gout plain films apparently showed evidence of moderate left knee osteoarthritis with some joint narrowing, sclerosis and osteophytes.  Patient received an intra-articular injection of Kenalog and Marcaine  Chest x-ray negative  June 20, 2022: Continues to have left knee pain recalcitrant to injection.    MRI of the left knee without contrast.  Abnormal destructive soft tissue mass that mildly erodes the anterior aspect of the medial tibial plateau and there is a smaller but similar signal characteristics lesion within the lateral trochlea extending into the trochlear notch. Findings are highly suspicious for metastatic disease.  Subacute pathologic fracture of the lateral aspect of the lateral tibial plateau with up to 3 mm cortical depression. Moderate to severe tricompartmental cartilage degenerative changes.  June 25, 2022: WBC 12.2 hemoglobin 14.2 MCV 106 platelet count 235; 74 segs 13 lymphs 9 monos 3 eos.  Chemistries notable for glucose of 103.  And 1.2 to total protein  6.3 albumin 4.0 uric acid 6.3  June 27 2022:  [place] He has been having increasing trouble getting out of the chair.  Spending more time sitting and having increasing back pain Has chronic back pain since surgery in 2011.  Patient states that he has been experiencing increasing pain in left knee, left pretibial region, left shoulder and lumbar region.    Review of Systems  Constitutional:  Positive for fatigue. Negative for appetite change, chills, fever and unexpected weight change.  HENT:   Negative for lump/mass, mouth sores, nosebleeds, sore throat, trouble swallowing and voice change.   Eyes:  Negative for eye problems and icterus.       Vision changes:  None  Respiratory:  Negative for chest tightness, cough, hemoptysis and wheezing.        DOE on walking from bed to chair  Cardiovascular:  Positive for leg swelling. Negative for chest pain and palpitations.       PND:  none Orthopnea:  none  Gastrointestinal:  Positive for abdominal pain. Negative for abdominal distention, blood in stool, constipation, diarrhea, nausea, rectal pain and vomiting.       Bloating and epigastric pain  Endocrine:       Cold intolerance:  none Heat intolerance:  none  Genitourinary:  Positive for frequency. Negative for bladder incontinence, difficulty urinating, dysuria, hematuria and nocturia.   Musculoskeletal:  Positive for gait problem. Negative for arthralgias, back pain, myalgias, neck pain and neck stiffness.  Skin:  Negative for itching, rash and wound.  Neurological:  Positive for extremity weakness, gait problem and numbness. Negative for dizziness, headaches, light-headedness, seizures and speech difficulty.  Gait problems due to knee pain Fell at home 3 days ago while in the bathroom.  Led to some bruising in lumbar region Right hand numb  Hematological:  Negative for adenopathy. Bruises/bleeds easily.    MEDICAL HISTORY: Past Medical History:   Diagnosis Date   Chronic back pain    COPD (chronic obstructive pulmonary disease) ([place])    DNR (do not resuscitate) 10/22/2020   Hypercholesteremia    Hypertension    Stroke ([place])     SURGICAL HISTORY: Past Surgical History:  Procedure Laterality Date   BACK SURGERY     JOINT REPLACEMENT     arthroscopic surgery rt side    SOCIAL HISTORY: Social History   Socioeconomic History   Marital status: Married    Spouse name: Janie   Number of children: 2   Years of education: 12   Highest education level: High school graduate  Occupational History   Not on file  Tobacco Use   Smoking status: Never   Smokeless tobacco: Never  Vaping Use   Vaping Use: Never used  Substance and Sexual Activity   Alcohol use: Not Currently   Drug use: Never   Sexual activity: Not on file  Other Topics Concern   Not on file  Social History Narrative   Not on file   Social Determinants of Health   Financial Resource Strain: Not on file  Food Insecurity: Not on file  Transportation Needs: Not on file  Physical Activity: Not on file  Stress: Not on file  Social Connections: Not on file  Intimate Partner Violence: Not on file    FAMILY HISTORY Family History  Problem Relation Age of Onset   Heart disease Mother    Lung disease Mother        never smoker   Emphysema Mother    Heart disease Father    [place] Son     ALLERGIES:  has No Known Allergies.  MEDICATIONS:  Current Outpatient Medications  Medication Sig Dispense Refill   amLODipine (NORVASC) 5 MG tablet Take 5 mg by mouth daily.     aspirin EC 81 MG EC tablet Take 1 tablet (81 mg total) by mouth daily. 30 tablet 3   atorvastatin (LIPITOR) 80 MG tablet Take 1 tablet (80 mg total) by mouth daily at 6 PM. 30 tablet 3   Cetirizine HCl (ZYRTEC PO) Take 10 mg by mouth.     cyanocobalamin (VITAMIN B12) 1000 MCG tablet Take 1,000 mcg by mouth daily.     fluticasone (FLONASE) 50 MCG/ACT nasal spray Place into both  nostrils.     HYDROcodone-acetaminophen (NORCO/VICODIN) 5-325 MG tablet Take 1 tablet by mouth every 6 (six) hours as needed.     isosorbide mononitrate (IMDUR) 30 MG 24 hr tablet TAKE 1 TABLET(30 MG) BY MOUTH DAILY 90 tablet 3   morphine (MS CONTIN) 15 MG 12 hr tablet Take 1 tablet (15 mg total) by mouth every 12 (twelve) hours. 60 tablet 0   Multiple Vitamins-Minerals (MENS 50+ MULTI VITAMIN/MIN PO) Take by mouth daily at 6 (six) AM.     olmesartan-hydrochlorothiazide (BENICAR HCT) 40-12.5 MG tablet Take 1 tablet by mouth daily.     pantoprazole (PROTONIX) 40 MG tablet Take 2 tablets (80 mg total) by mouth daily. 90 tablet 3   polycarbophil (FIBERCON) 625 MG tablet Take 625 mg by mouth daily.     Simethicone 180 MG CAPS Take by mouth.     acetaminophen (TYLENOL) 325 MG tablet Take 1-2  tablets (325-650 mg total) by mouth every 4 (four) hours as needed for mild pain. (Patient not taking: Reported on 06/27/2022)     albuterol (2.5 MG/3ML) 0.083% NEBU 3 mL, albuterol (5 MG/ML) 0.5% NEBU 0.5 mL Inhale 5 mg into the lungs daily as needed (shortness of breath). (Patient not taking: Reported on 06/27/2022)     albuterol (VENTOLIN HFA) 108 (90 Base) MCG/ACT inhaler Inhale 1-2 puffs into the lungs every 6 (six) hours as needed for wheezing or shortness of breath. (Patient not taking: Reported on 06/27/2022)     No current facility-administered medications for this visit.    PHYSICAL EXAMINATION:  ECOG PERFORMANCE STATUS: 3 - Symptomatic, >50% confined to bed   Vitals:   06/27/22 1323  BP: 132/69  Pulse: (!) 107  Resp: 18  Temp: 97.7 F (36.5 C)  SpO2: 97%    Filed Weights   06/27/22 1323  Weight: 200 lb (90.7 kg)     Physical Exam Vitals and nursing note reviewed.  Constitutional:      General: He is not in acute distress.    Appearance: Normal appearance. He is normal weight. He is ill-appearing. He is not toxic-appearing or diaphoretic.     Comments: Seated in rollator.  Here with  wife and daughter.  Needed assistance from staff to get out of the car into the rollator  HENT:     Head: Normocephalic and atraumatic.     Right Ear: External ear normal.     Left Ear: External ear normal.     Nose: Nose normal.  Eyes:     General: No scleral icterus.    Conjunctiva/sclera: Conjunctivae normal.     Pupils: Pupils are equal, round, and reactive to light.  Cardiovascular:     Rate and Rhythm: Normal rate and regular rhythm.     Heart sounds:     No friction rub. No gallop.  Pulmonary:     Effort: Pulmonary effort is normal. No respiratory distress.     Breath sounds: Normal breath sounds. No stridor. No wheezing, rhonchi or rales.  Chest:     Chest wall: No tenderness.  Abdominal:     General: There is no distension.     Palpations: Abdomen is soft. There is no mass.     Tenderness: There is no abdominal tenderness. There is no guarding or rebound.     Hernia: No hernia is present.  Musculoskeletal:        General: No deformity.     Cervical back: Normal range of motion and neck supple. No rigidity or tenderness.     Right lower leg: Edema present.     Left lower leg: Edema present.     Comments: Seated in rollator.  Favors use of right side  Lymphadenopathy:     Head:     Right side of head: No submental, submandibular, tonsillar, preauricular, posterior auricular or occipital adenopathy.     Left side of head: No submental, submandibular, tonsillar, preauricular, posterior auricular or occipital adenopathy.     Cervical: No cervical adenopathy.     Right cervical: No superficial, deep or posterior cervical adenopathy.    Left cervical: No superficial, deep or posterior cervical adenopathy.     Upper Body:     Right upper body: No supraclavicular or axillary adenopathy.     Left upper body: No supraclavicular or axillary adenopathy.     Lower Body: No right inguinal adenopathy. No left inguinal adenopathy.  Skin:  Coloration: Skin is not jaundiced or pale.      Findings: Bruising present. No erythema or rash.  Neurological:     Mental Status: He is alert and oriented to person, place, and time.     Motor: Weakness present.     Comments: Left facial droop.  Examined seated in rollator because of mobility issues  Psychiatric:        Mood and Affect: Mood normal.        Behavior: Behavior normal.        Thought Content: Thought content normal.        Judgment: Judgment normal.     LABORATORY DATA: I have personally reviewed the data as listed:  Admission on 06/08/2022, Discharged on 06/08/2022  Component Date Value Ref Range Status   Sodium 06/08/2022 138  135 - 145 mmol/L Final   Potassium 06/08/2022 4.1  3.5 - 5.1 mmol/L Final   Chloride 06/08/2022 107  98 - 111 mmol/L Final   CO2 06/08/2022 25  22 - 32 mmol/L Final   Glucose, Bld 06/08/2022 99  70 - 99 mg/dL Final   Glucose reference range applies only to samples taken after fasting for at least 8 hours.   BUN 06/08/2022 37 (H)  8 - 23 mg/dL Final   Creatinine, Ser 06/08/2022 1.29 (H)  0.61 - 1.24 mg/dL Final   Calcium 06/08/2022 8.8 (L)  8.9 - 10.3 mg/dL Final   GFR, Estimated 06/08/2022 53 (L)  >60 mL/min Final   Comment: (NOTE) Calculated using the CKD-EPI Creatinine Equation (2021)    Anion gap 06/08/2022 6  5 - 15 Final   Performed at [facility], [place] [address], [place], [place] [address]   WBC 06/08/2022 12.3 (H)  4.0 - 10.5 K/uL Final   RBC 06/08/2022 3.92 (L)  4.22 - 5.81 MIL/uL Final   Hemoglobin 06/08/2022 14.8  13.0 - 17.0 g/dL Final   HCT 06/08/2022 42.8  39.0 - 52.0 % Final   MCV 06/08/2022 109.2 (H)  80.0 - 100.0 fL Final   MCH 06/08/2022 37.8 (H)  26.0 - 34.0 pg Final   MCHC 06/08/2022 34.6  30.0 - 36.0 g/dL Final   RDW 06/08/2022 12.3  11.5 - 15.5 % Final   Platelets 06/08/2022 189  150 - 400 K/uL Final   nRBC 06/08/2022 0.0  0.0 - 0.2 % Final   Performed at [facility] [address] [place], [place] [address]   Troponin I (High Sensitivity)  06/08/2022 34 (H)  <18 ng/L Final   Comment: (NOTE) Elevated high sensitivity troponin I (hsTnI) values and significant  changes across serial measurements may suggest ACS but many other  chronic and acute conditions are known to elevate hsTnI results.  Refer to the "Links" section for chest pain algorithms and additional  guidance. Performed at [facility], [place] [address] [place], [place] [address]    B Natriuretic Peptide 06/08/2022 72.6  0.0 - 100.0 pg/mL Final   Performed at [facility] [address] [place], [place] [address]   Magnesium 06/08/2022 1.8  1.7 - 2.4 mg/dL Final   Performed at [facility] [address], [place], [place] [address]   Troponin I (High Sensitivity) 06/08/2022 49 (H)  <18 ng/L Final   Comment: (NOTE) Elevated high sensitivity troponin I (hsTnI) values and significant  changes across serial measurements may suggest ACS but many other  chronic and acute conditions are known to elevate  hsTnI results.  Refer to the "Links" section for chest pain algorithms and additional  guidance. Performed at [facility], [place] [address] [place],  [address]     RADIOGRAPHIC STUDIES: I have personally reviewed the radiological images as listed and agree with the findings in the report  No results found.  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  At postmortem, 27 to 90 percent of patients with breast or prostate Park have some form of skeletal metastases.  Among solid cancers, breast, prostate, lung, thyroid, and kidney Park account for 80 percent of all skeletal metastases.  Many other primary malignant tumors can spread to bone, including, but not  limited to, melanoma, lymphoma, sarcoma, and gastrointestinal malignancies.  Patient was treated with XRT in 2008 for prostate Park and is therefore at increased risk for development of MDS and secondary malignancies of lower GU/GI tracts.   Has been referred for PET/CT by orthopedics.  Laboratory evaluation:  CBC with diff, CMP, LDH, CEA, PSA,  SPEP with IEP, free light chains   Macrocytic anemia:  Causes can be divided into 3 categories:  Megaloblastic, non-Megaloblastic, False elevations  Possible causes in this patient Megaloblastic (involving vitamin B12 and/or folate deficiencies)    Atrophic gastritis Enteral malabsorption Anticonvulsants (some cause folate depletion) Primary bone marrow disorders Inherited disorders  Nonmegaloblastic    Medication side effects  Myelodysplasia Hypothyroidism Liver disease Hemolysis (reticulocytosis) Chronic obstructive pulmonary disease  False elevations    Cold agglutinins   Evaluation:  Obtain CBC with diff, CMP, smear.  B12, folate, ferritin, DAT, haptoglobin.      Park related pain:  Continue hydrocodone PRN.  Will add MS Contin 15 mg po bid  Decreased mobility:  Secondary to CVA and most recently from bone metastasis.  Will ask home health to help patient and family with DME and teach them proper transfer techniques.  Prognosis:  If patient found to have a malignancy then treatment would be palliative and limited by poor performance status.      Park Staging  No matching staging information was found for the patient.   No problem-specific Assessment & Plan notes found for this encounter.   Orders Placed This Encounter  Procedures   CBC with Differential ([facility] Only)    Standing Status:   Future    Standing Expiration Date:   06/28/2023   CMP ([facility] only)    Standing Status:   Future    Standing Expiration Date:   06/28/2023   Folate    Standing Status:   Future    Standing Expiration Date:    06/28/2023   Haptoglobin    Standing Status:   Future    Standing Expiration Date:   06/28/2023   Kappa/lambda light chains    Standing Status:   Future    Standing Expiration Date:   06/28/2023   Multiple Myeloma Panel (SPEP&IFE w/QIG)    Standing Status:   Future    Standing Expiration Date:   06/28/2023   Reticulocytes    Standing Status:   Future    Standing Expiration Date:   06/28/2023   Vitamin B12    Standing Status:   Future    Standing Expiration Date:  06/28/2023   CEA    Standing Status:   Future    Standing Expiration Date:   06/28/2023   Chromogranin A    Standing Status:   Future    Standing Expiration Date:   06/28/2023   Lactate dehydrogenase    Standing Status:   Future    Standing Expiration Date:   06/28/2023   PSA    Standing Status:   Future    Standing Expiration Date:   06/28/2023   Thyroglobulin Level    Standing Status:   Future    Standing Expiration Date:   06/28/2023   Direct antiglobulin test (not at [facility])    Standing Status:   Future    Standing Expiration Date:   06/28/2023    All questions were answered. The patient knows to call the clinic with any problems, questions or concerns.  This note was electronically signed.    Barbee Cough, MD  06/27/2022 2:14 PM

## 2022-06-26 ENCOUNTER — Other Ambulatory Visit: Payer: Self-pay | Admitting: Orthopedic Surgery

## 2022-06-26 ENCOUNTER — Other Ambulatory Visit (HOSPITAL_COMMUNITY): Payer: Self-pay | Admitting: Orthopedic Surgery

## 2022-06-26 ENCOUNTER — Other Ambulatory Visit (HOSPITAL_COMMUNITY): Payer: Self-pay | Admitting: Urology

## 2022-06-26 DIAGNOSIS — C61 Malignant neoplasm of prostate: Secondary | ICD-10-CM

## 2022-06-26 DIAGNOSIS — G5742 Lesion of medial popliteal nerve, left lower limb: Secondary | ICD-10-CM

## 2022-06-27 ENCOUNTER — Encounter: Payer: Self-pay | Admitting: Oncology

## 2022-06-27 ENCOUNTER — Inpatient Hospital Stay: Payer: Medicare Other

## 2022-06-27 ENCOUNTER — Inpatient Hospital Stay: Payer: Medicare Other | Attending: Oncology | Admitting: Oncology

## 2022-06-27 VITALS — BP 132/69 | HR 107 | Temp 97.7°F | Resp 18 | Ht 71.0 in | Wt 200.0 lb

## 2022-06-27 DIAGNOSIS — N1831 Chronic kidney disease, stage 3a: Secondary | ICD-10-CM

## 2022-06-27 DIAGNOSIS — I129 Hypertensive chronic kidney disease with stage 1 through stage 4 chronic kidney disease, or unspecified chronic kidney disease: Secondary | ICD-10-CM | POA: Diagnosis not present

## 2022-06-27 DIAGNOSIS — J449 Chronic obstructive pulmonary disease, unspecified: Secondary | ICD-10-CM | POA: Insufficient documentation

## 2022-06-27 DIAGNOSIS — G893 Neoplasm related pain (acute) (chronic): Secondary | ICD-10-CM | POA: Insufficient documentation

## 2022-06-27 DIAGNOSIS — Z808 Family history of malignant neoplasm of other organs or systems: Secondary | ICD-10-CM | POA: Diagnosis not present

## 2022-06-27 DIAGNOSIS — C7951 Secondary malignant neoplasm of bone: Secondary | ICD-10-CM

## 2022-06-27 DIAGNOSIS — Z79899 Other long term (current) drug therapy: Secondary | ICD-10-CM | POA: Insufficient documentation

## 2022-06-27 DIAGNOSIS — R269 Unspecified abnormalities of gait and mobility: Secondary | ICD-10-CM

## 2022-06-27 DIAGNOSIS — Z8673 Personal history of transient ischemic attack (TIA), and cerebral infarction without residual deficits: Secondary | ICD-10-CM | POA: Diagnosis not present

## 2022-06-27 DIAGNOSIS — Z8546 Personal history of malignant neoplasm of prostate: Secondary | ICD-10-CM | POA: Diagnosis not present

## 2022-06-27 DIAGNOSIS — Z09 Encounter for follow-up examination after completed treatment for conditions other than malignant neoplasm: Secondary | ICD-10-CM

## 2022-06-27 DIAGNOSIS — D539 Nutritional anemia, unspecified: Secondary | ICD-10-CM

## 2022-06-27 DIAGNOSIS — Z923 Personal history of irradiation: Secondary | ICD-10-CM | POA: Diagnosis not present

## 2022-06-27 DIAGNOSIS — N189 Chronic kidney disease, unspecified: Secondary | ICD-10-CM | POA: Diagnosis not present

## 2022-06-27 DIAGNOSIS — Z7189 Other specified counseling: Secondary | ICD-10-CM

## 2022-06-27 LAB — CMP (CANCER CENTER ONLY)
ALT: 19 U/L (ref 0–44)
AST: 25 U/L (ref 15–41)
Albumin: 3.6 g/dL (ref 3.5–5.0)
Alkaline Phosphatase: 126 U/L (ref 38–126)
Anion gap: 6 (ref 5–15)
BUN: 44 mg/dL — ABNORMAL HIGH (ref 8–23)
CO2: 26 mmol/L (ref 22–32)
Calcium: 9.3 mg/dL (ref 8.9–10.3)
Chloride: 106 mmol/L (ref 98–111)
Creatinine: 1.17 mg/dL (ref 0.61–1.24)
GFR, Estimated: 60 mL/min — ABNORMAL LOW (ref >60)
Glucose, Bld: 106 mg/dL — ABNORMAL HIGH (ref 70–99)
Potassium: 4.7 mmol/L (ref 3.5–5.1)
Sodium: 138 mmol/L (ref 135–145)
Total Bilirubin: 1 mg/dL (ref 0.3–1.2)
Total Protein: 6.7 g/dL (ref 6.5–8.1)

## 2022-06-27 LAB — CBC WITH DIFFERENTIAL (CANCER CENTER ONLY)
Abs Immature Granulocytes: 0.11 10*3/uL — ABNORMAL HIGH (ref 0.00–0.07)
Basophils Absolute: 0 10*3/uL (ref 0.0–0.1)
Basophils Relative: 0 %
Eosinophils Absolute: 0.2 10*3/uL (ref 0.0–0.5)
Eosinophils Relative: 2 %
HCT: 43.8 % (ref 39.0–52.0)
Hemoglobin: 14.2 g/dL (ref 13.0–17.0)
Immature Granulocytes: 1 %
Lymphocytes Relative: 20 %
Lymphs Abs: 2 10*3/uL (ref 0.7–4.0)
MCH: 36.1 pg — ABNORMAL HIGH (ref 26.0–34.0)
MCHC: 32.4 g/dL (ref 30.0–36.0)
MCV: 111.5 fL — ABNORMAL HIGH (ref 80.0–100.0)
Monocytes Absolute: 1.2 10*3/uL — ABNORMAL HIGH (ref 0.1–1.0)
Monocytes Relative: 12 %
Neutro Abs: 6.6 10*3/uL (ref 1.7–7.7)
Neutrophils Relative %: 65 %
Platelet Count: 237 10*3/uL (ref 150–400)
RBC: 3.93 MIL/uL — ABNORMAL LOW (ref 4.22–5.81)
RDW: 13 % (ref 11.5–15.5)
WBC Count: 10.1 10*3/uL (ref 4.0–10.5)
nRBC: 0 % (ref 0.0–0.2)

## 2022-06-27 LAB — VITAMIN B12: Vitamin B-12: 475 pg/mL (ref 180–914)

## 2022-06-27 LAB — RETICULOCYTES
Immature Retic Fract: 11.6 % (ref 2.3–15.9)
RBC.: 3.81 MIL/uL — ABNORMAL LOW (ref 4.22–5.81)
Retic Count, Absolute: 77.7 10*3/uL (ref 19.0–186.0)
Retic Ct Pct: 2 % (ref 0.4–3.1)

## 2022-06-27 LAB — PSA: Prostatic Specific Antigen: 0.2 ng/mL (ref 0.00–4.00)

## 2022-06-27 LAB — DIRECT ANTIGLOBULIN TEST (NOT AT ARMC)
DAT, IgG: NEGATIVE
DAT, complement: NEGATIVE

## 2022-06-27 LAB — LACTATE DEHYDROGENASE: LDH: 270 U/L — ABNORMAL HIGH (ref 98–192)

## 2022-06-27 LAB — FOLATE: Folate: 21.3 ng/mL (ref >5.9)

## 2022-06-27 MED ORDER — MORPHINE SULFATE ER 15 MG PO TBCR: 15.0000 mg | EXTENDED_RELEASE_TABLET | Freq: Two times a day (BID) | ORAL | 0 refills | Status: AC

## 2022-06-28 ENCOUNTER — Encounter (HOSPITAL_COMMUNITY)
Admission: RE | Admit: 2022-06-28 | Discharge: 2022-06-28 | Disposition: A | Discharge status: Home / Self Care | Payer: Medicare Other | Source: Ambulatory Visit | Attending: Urology | Admitting: Urology

## 2022-06-28 DIAGNOSIS — C61 Malignant neoplasm of prostate: Secondary | ICD-10-CM | POA: Diagnosis not present

## 2022-06-28 DIAGNOSIS — I700 Atherosclerosis of aorta: Secondary | ICD-10-CM | POA: Diagnosis not present

## 2022-06-28 DIAGNOSIS — M150 Primary generalized (osteo)arthritis: Secondary | ICD-10-CM | POA: Diagnosis not present

## 2022-06-28 DIAGNOSIS — I69354 Hemiplegia and hemiparesis following cerebral infarction affecting left non-dominant side: Secondary | ICD-10-CM | POA: Diagnosis not present

## 2022-06-28 LAB — KAPPA/LAMBDA LIGHT CHAINS
Kappa free light chain: 34.5 mg/L — ABNORMAL HIGH (ref 3.3–19.4)
Kappa, lambda light chain ratio: 1.53 (ref 0.26–1.65)
Lambda free light chains: 22.6 mg/L (ref 5.7–26.3)

## 2022-06-28 LAB — HAPTOGLOBIN: Haptoglobin: 134 mg/dL (ref 38–329)

## 2022-06-28 LAB — CHROMOGRANIN A: Chromogranin A (ng/mL): 1108 ng/mL — ABNORMAL HIGH (ref 0.0–101.8)

## 2022-06-28 MED ORDER — PIFLIFOLASTAT F 18 (PYLARIFY) INJECTION
9.0000 | Freq: Once | INTRAVENOUS | Status: AC
  Administered 2022-06-28: 8.94 via INTRAVENOUS

## 2022-06-29 ENCOUNTER — Encounter: Payer: Self-pay | Admitting: General Practice

## 2022-06-29 ENCOUNTER — Other Ambulatory Visit (HOSPITAL_COMMUNITY): Payer: Medicare Other

## 2022-06-29 NOTE — Progress Notes (Unsigned)
Duane Peaches, MD  Allen Kell, NT; P Ir Procedure Requests Approved.  CT guided bx soft tissue met left lateral tibial plateau.   HKM        Previous Messages    ----- Message -----  From: Allen Kell, NT  Sent: 06/29/2022   9:14 AM EDT  To: Ir Procedure Requests  Subject: FW: CT Guided Biopsy                           Following up  ----- Message -----  From: Allen Kell, NT  Sent: 06/26/2022   9:25 AM EDT  To: Ir Procedure Requests  Subject: CT Guided Biopsy                               Procedure:CT Bone Trocar/Needle Biopsy Deep   Reason:Lesion of left tibial nerve   History: MRI in chart   Provider:MURPHY, TIMOTHY D   Contact:[phone]

## 2022-07-01 DIAGNOSIS — M549 Dorsalgia, unspecified: Secondary | ICD-10-CM | POA: Diagnosis not present

## 2022-07-01 DIAGNOSIS — M25462 Effusion, left knee: Secondary | ICD-10-CM | POA: Diagnosis not present

## 2022-07-01 DIAGNOSIS — R4182 Altered mental status, unspecified: Secondary | ICD-10-CM | POA: Diagnosis not present

## 2022-07-01 DIAGNOSIS — R000 Tachycardia, unspecified: Secondary | ICD-10-CM | POA: Diagnosis not present

## 2022-07-01 DIAGNOSIS — I2510 Atherosclerotic heart disease of native coronary artery without angina pectoris: Secondary | ICD-10-CM | POA: Diagnosis not present

## 2022-07-01 DIAGNOSIS — R0689 Other abnormalities of breathing: Secondary | ICD-10-CM | POA: Diagnosis not present

## 2022-07-01 DIAGNOSIS — I700 Atherosclerosis of aorta: Secondary | ICD-10-CM | POA: Diagnosis not present

## 2022-07-01 DIAGNOSIS — C61 Malignant neoplasm of prostate: Secondary | ICD-10-CM | POA: Diagnosis not present

## 2022-07-01 DIAGNOSIS — N200 Calculus of kidney: Secondary | ICD-10-CM | POA: Diagnosis not present

## 2022-07-01 DIAGNOSIS — R410 Disorientation, unspecified: Secondary | ICD-10-CM | POA: Diagnosis not present

## 2022-07-01 DIAGNOSIS — I160 Hypertensive urgency: Secondary | ICD-10-CM | POA: Diagnosis not present

## 2022-07-01 DIAGNOSIS — J8410 Pulmonary fibrosis, unspecified: Secondary | ICD-10-CM | POA: Diagnosis not present

## 2022-07-02 ENCOUNTER — Ambulatory Visit: Payer: Medicare Other | Admitting: Oncology

## 2022-07-02 ENCOUNTER — Telehealth: Payer: Self-pay | Admitting: Oncology

## 2022-07-02 DIAGNOSIS — R410 Disorientation, unspecified: Secondary | ICD-10-CM | POA: Diagnosis not present

## 2022-07-02 DIAGNOSIS — R2689 Other abnormalities of gait and mobility: Secondary | ICD-10-CM | POA: Diagnosis present

## 2022-07-02 DIAGNOSIS — R5381 Other malaise: Secondary | ICD-10-CM | POA: Diagnosis not present

## 2022-07-02 DIAGNOSIS — N200 Calculus of kidney: Secondary | ICD-10-CM | POA: Diagnosis not present

## 2022-07-02 DIAGNOSIS — Z66 Do not resuscitate: Secondary | ICD-10-CM | POA: Diagnosis present

## 2022-07-02 DIAGNOSIS — K5900 Constipation, unspecified: Secondary | ICD-10-CM | POA: Diagnosis present

## 2022-07-02 DIAGNOSIS — M84462A Pathological fracture, left tibia, initial encounter for fracture: Secondary | ICD-10-CM | POA: Diagnosis not present

## 2022-07-02 DIAGNOSIS — C61 Malignant neoplasm of prostate: Secondary | ICD-10-CM | POA: Diagnosis not present

## 2022-07-02 DIAGNOSIS — E872 Acidosis, unspecified: Secondary | ICD-10-CM | POA: Diagnosis not present

## 2022-07-02 DIAGNOSIS — G9341 Metabolic encephalopathy: Secondary | ICD-10-CM | POA: Diagnosis not present

## 2022-07-02 DIAGNOSIS — I700 Atherosclerosis of aorta: Secondary | ICD-10-CM | POA: Diagnosis not present

## 2022-07-02 DIAGNOSIS — I2510 Atherosclerotic heart disease of native coronary artery without angina pectoris: Secondary | ICD-10-CM | POA: Diagnosis not present

## 2022-07-02 DIAGNOSIS — R4182 Altered mental status, unspecified: Secondary | ICD-10-CM | POA: Diagnosis not present

## 2022-07-02 DIAGNOSIS — I10 Essential (primary) hypertension: Secondary | ICD-10-CM | POA: Diagnosis present

## 2022-07-02 DIAGNOSIS — J449 Chronic obstructive pulmonary disease, unspecified: Secondary | ICD-10-CM | POA: Diagnosis not present

## 2022-07-02 DIAGNOSIS — I160 Hypertensive urgency: Secondary | ICD-10-CM | POA: Diagnosis not present

## 2022-07-02 DIAGNOSIS — Z8546 Personal history of malignant neoplasm of prostate: Secondary | ICD-10-CM | POA: Diagnosis not present

## 2022-07-02 DIAGNOSIS — I69354 Hemiplegia and hemiparesis following cerebral infarction affecting left non-dominant side: Secondary | ICD-10-CM | POA: Diagnosis not present

## 2022-07-02 DIAGNOSIS — R531 Weakness: Secondary | ICD-10-CM | POA: Diagnosis not present

## 2022-07-02 DIAGNOSIS — C7951 Secondary malignant neoplasm of bone: Secondary | ICD-10-CM | POA: Diagnosis not present

## 2022-07-02 DIAGNOSIS — Z923 Personal history of irradiation: Secondary | ICD-10-CM | POA: Diagnosis not present

## 2022-07-02 DIAGNOSIS — R000 Tachycardia, unspecified: Secondary | ICD-10-CM | POA: Diagnosis not present

## 2022-07-02 DIAGNOSIS — N2889 Other specified disorders of kidney and ureter: Secondary | ICD-10-CM | POA: Diagnosis present

## 2022-07-02 DIAGNOSIS — A419 Sepsis, unspecified organism: Secondary | ICD-10-CM | POA: Diagnosis not present

## 2022-07-02 DIAGNOSIS — J8410 Pulmonary fibrosis, unspecified: Secondary | ICD-10-CM | POA: Diagnosis not present

## 2022-07-02 DIAGNOSIS — Z79899 Other long term (current) drug therapy: Secondary | ICD-10-CM | POA: Diagnosis not present

## 2022-07-02 DIAGNOSIS — K219 Gastro-esophageal reflux disease without esophagitis: Secondary | ICD-10-CM | POA: Diagnosis not present

## 2022-07-02 DIAGNOSIS — E785 Hyperlipidemia, unspecified: Secondary | ICD-10-CM | POA: Diagnosis not present

## 2022-07-02 DIAGNOSIS — M25462 Effusion, left knee: Secondary | ICD-10-CM | POA: Diagnosis not present

## 2022-07-02 DIAGNOSIS — Z1152 Encounter for screening for COVID-19: Secondary | ICD-10-CM | POA: Diagnosis not present

## 2022-07-02 DIAGNOSIS — Z79891 Long term (current) use of opiate analgesic: Secondary | ICD-10-CM | POA: Diagnosis not present

## 2022-07-02 DIAGNOSIS — Z7401 Bed confinement status: Secondary | ICD-10-CM | POA: Diagnosis not present

## 2022-07-02 DIAGNOSIS — Z7982 Long term (current) use of aspirin: Secondary | ICD-10-CM | POA: Diagnosis not present

## 2022-07-02 LAB — MULTIPLE MYELOMA PANEL, SERUM
Albumin SerPl Elph-Mcnc: 3.2 g/dL (ref 2.9–4.4)
Albumin/Glob SerPl: 1.2 (ref 0.7–1.7)
Alpha 1: 0.3 g/dL (ref 0.0–0.4)
Alpha2 Glob SerPl Elph-Mcnc: 0.7 g/dL (ref 0.4–1.0)
B-Globulin SerPl Elph-Mcnc: 0.9 g/dL (ref 0.7–1.3)
Gamma Glob SerPl Elph-Mcnc: 0.8 g/dL (ref 0.4–1.8)
Globulin, Total: 2.8 g/dL (ref 2.2–3.9)
IgA: 254 mg/dL (ref 61–437)
IgG (Immunoglobin G), Serum: 827 mg/dL (ref 603–1613)
IgM (Immunoglobulin M), Srm: 144 mg/dL — ABNORMAL HIGH (ref 15–143)
Total Protein ELP: 6 g/dL (ref 6.0–8.5)

## 2022-07-02 NOTE — Telephone Encounter (Signed)
07/02/22 Patient appt cancelled.Admitted to hospital.

## 2022-07-06 DIAGNOSIS — E782 Mixed hyperlipidemia: Secondary | ICD-10-CM | POA: Diagnosis not present

## 2022-07-06 DIAGNOSIS — I700 Atherosclerosis of aorta: Secondary | ICD-10-CM | POA: Diagnosis not present

## 2022-07-06 DIAGNOSIS — I10 Essential (primary) hypertension: Secondary | ICD-10-CM | POA: Diagnosis not present

## 2022-07-09 ENCOUNTER — Telehealth: Payer: Self-pay

## 2022-07-09 NOTE — Telephone Encounter (Signed)
Dr Federico Flake and Jobe Igo, notified.

## 2022-07-10 DIAGNOSIS — E782 Mixed hyperlipidemia: Secondary | ICD-10-CM | POA: Diagnosis not present

## 2022-07-10 DIAGNOSIS — I10 Essential (primary) hypertension: Secondary | ICD-10-CM | POA: Diagnosis not present

## 2022-07-10 DIAGNOSIS — I700 Atherosclerosis of aorta: Secondary | ICD-10-CM | POA: Diagnosis not present

## 2022-07-10 LAB — THYROGLOBULIN LEVEL: Thyroglobulin: 6 ng/mL

## 2022-07-11 DEATH — deceased

## 2022-07-12 ENCOUNTER — Ambulatory Visit: Payer: Medicare Other | Admitting: Podiatry
